# Patient Record
Sex: Female | Born: 1955 | Race: White | Hispanic: No | Marital: Married | Smoking: Former smoker
Health system: Southern US, Community
[De-identification: ages and names within clinical notes are randomized; demographics above are authoritative.]

## PROBLEM LIST (undated history)

## (undated) DIAGNOSIS — R112 Nausea with vomiting, unspecified: Secondary | ICD-10-CM

## (undated) DIAGNOSIS — E139 Other specified diabetes mellitus without complications: Secondary | ICD-10-CM

## (undated) DIAGNOSIS — S065X9A Traumatic subdural hemorrhage with loss of consciousness of unspecified duration, initial encounter: Secondary | ICD-10-CM

## (undated) DIAGNOSIS — Q613 Polycystic kidney, unspecified: Secondary | ICD-10-CM

## (undated) DIAGNOSIS — D649 Anemia, unspecified: Secondary | ICD-10-CM

## (undated) DIAGNOSIS — K7689 Other specified diseases of liver: Secondary | ICD-10-CM

## (undated) DIAGNOSIS — R7303 Prediabetes: Secondary | ICD-10-CM

## (undated) DIAGNOSIS — Z87442 Personal history of urinary calculi: Secondary | ICD-10-CM

## (undated) DIAGNOSIS — Z940 Kidney transplant status: Secondary | ICD-10-CM

## (undated) DIAGNOSIS — N170 Acute kidney failure with tubular necrosis: Secondary | ICD-10-CM

## (undated) DIAGNOSIS — N200 Calculus of kidney: Secondary | ICD-10-CM

## (undated) DIAGNOSIS — M1990 Unspecified osteoarthritis, unspecified site: Secondary | ICD-10-CM

## (undated) DIAGNOSIS — Z9889 Other specified postprocedural states: Secondary | ICD-10-CM

## (undated) DIAGNOSIS — T8619 Other complication of kidney transplant: Secondary | ICD-10-CM

## (undated) DIAGNOSIS — K219 Gastro-esophageal reflux disease without esophagitis: Secondary | ICD-10-CM

## (undated) DIAGNOSIS — I499 Cardiac arrhythmia, unspecified: Secondary | ICD-10-CM

## (undated) DIAGNOSIS — I10 Essential (primary) hypertension: Secondary | ICD-10-CM

## (undated) DIAGNOSIS — I701 Atherosclerosis of renal artery: Secondary | ICD-10-CM

## (undated) DIAGNOSIS — J189 Pneumonia, unspecified organism: Secondary | ICD-10-CM

## (undated) DIAGNOSIS — S065XAA Traumatic subdural hemorrhage with loss of consciousness status unknown, initial encounter: Secondary | ICD-10-CM

## (undated) DIAGNOSIS — M109 Gout, unspecified: Secondary | ICD-10-CM

## (undated) DIAGNOSIS — C801 Malignant (primary) neoplasm, unspecified: Secondary | ICD-10-CM

## (undated) DIAGNOSIS — Z5189 Encounter for other specified aftercare: Secondary | ICD-10-CM

## (undated) DIAGNOSIS — N281 Cyst of kidney, acquired: Secondary | ICD-10-CM

## (undated) DIAGNOSIS — Q6100 Congenital renal cyst, unspecified: Secondary | ICD-10-CM

## (undated) DIAGNOSIS — I4891 Unspecified atrial fibrillation: Secondary | ICD-10-CM

## (undated) DIAGNOSIS — E785 Hyperlipidemia, unspecified: Secondary | ICD-10-CM

## (undated) HISTORY — DX: Hyperlipidemia, unspecified: E78.5

## (undated) HISTORY — DX: Atherosclerosis of renal artery: I70.1

## (undated) HISTORY — DX: Unspecified atrial fibrillation: I48.91

## (undated) HISTORY — PX: KIDNEY TRANSPLANT: SHX239

## (undated) HISTORY — DX: Gastro-esophageal reflux disease without esophagitis: K21.9

## (undated) HISTORY — DX: Gout, unspecified: M10.9

## (undated) HISTORY — PX: TONSILLECTOMY: SUR1361

## (undated) HISTORY — DX: Traumatic subdural hemorrhage with loss of consciousness of unspecified duration, initial encounter: S06.5X9A

## (undated) HISTORY — DX: Acute kidney failure with tubular necrosis: N17.0

## (undated) HISTORY — DX: Kidney transplant status: Z94.0

## (undated) HISTORY — DX: Other specified diseases of liver: K76.89

## (undated) HISTORY — DX: Other specified diabetes mellitus without complications: E13.9

## (undated) HISTORY — DX: Encounter for other specified aftercare: Z51.89

## (undated) HISTORY — PX: KIDNEY STONE SURGERY: SHX686

## (undated) HISTORY — DX: Other complication of kidney transplant: T86.19

## (undated) HISTORY — DX: Traumatic subdural hemorrhage with loss of consciousness status unknown, initial encounter: S06.5XAA

## (undated) HISTORY — DX: Cyst of kidney, acquired: N28.1

## (undated) HISTORY — DX: Congenital renal cyst, unspecified: Q61.00

## (undated) HISTORY — DX: Polycystic kidney, unspecified: Q61.3

## (undated) HISTORY — DX: Calculus of kidney: N20.0

---

## 1999-02-10 ENCOUNTER — Other Ambulatory Visit: Admission: RE | Admit: 1999-02-10 | Discharge: 1999-02-10 | Payer: Self-pay | Admitting: Obstetrics and Gynecology

## 2000-06-07 ENCOUNTER — Other Ambulatory Visit: Admission: RE | Admit: 2000-06-07 | Discharge: 2000-06-07 | Payer: Self-pay | Admitting: Obstetrics and Gynecology

## 2000-11-16 ENCOUNTER — Encounter: Payer: Self-pay | Admitting: Obstetrics and Gynecology

## 2000-11-16 ENCOUNTER — Ambulatory Visit (HOSPITAL_COMMUNITY): Admission: RE | Admit: 2000-11-16 | Discharge: 2000-11-16 | Payer: Self-pay | Admitting: Obstetrics and Gynecology

## 2000-11-26 ENCOUNTER — Inpatient Hospital Stay (HOSPITAL_COMMUNITY): Admission: EM | Admit: 2000-11-26 | Discharge: 2000-11-30 | Payer: Self-pay | Admitting: Neurosurgery

## 2000-11-26 ENCOUNTER — Encounter: Payer: Self-pay | Admitting: Emergency Medicine

## 2000-11-26 HISTORY — PX: OTHER SURGICAL HISTORY: SHX169

## 2000-11-28 ENCOUNTER — Encounter: Payer: Self-pay | Admitting: Neurosurgery

## 2000-12-19 ENCOUNTER — Encounter: Payer: Self-pay | Admitting: Neurosurgery

## 2000-12-19 ENCOUNTER — Encounter: Admission: RE | Admit: 2000-12-19 | Discharge: 2000-12-19 | Payer: Self-pay | Admitting: Neurosurgery

## 2001-12-01 ENCOUNTER — Encounter: Admission: RE | Admit: 2001-12-01 | Discharge: 2001-12-01 | Payer: Self-pay | Admitting: Neurosurgery

## 2001-12-01 ENCOUNTER — Encounter: Payer: Self-pay | Admitting: Neurosurgery

## 2002-01-08 ENCOUNTER — Encounter: Payer: Self-pay | Admitting: Emergency Medicine

## 2002-01-08 ENCOUNTER — Emergency Department (HOSPITAL_COMMUNITY): Admission: EM | Admit: 2002-01-08 | Discharge: 2002-01-08 | Payer: Self-pay | Admitting: Emergency Medicine

## 2002-03-08 ENCOUNTER — Other Ambulatory Visit: Admission: RE | Admit: 2002-03-08 | Discharge: 2002-03-08 | Payer: Self-pay | Admitting: Obstetrics and Gynecology

## 2003-03-12 ENCOUNTER — Ambulatory Visit (HOSPITAL_COMMUNITY): Admission: RE | Admit: 2003-03-12 | Discharge: 2003-03-12 | Payer: Self-pay | Admitting: Obstetrics and Gynecology

## 2003-03-12 ENCOUNTER — Encounter: Payer: Self-pay | Admitting: Obstetrics and Gynecology

## 2003-03-20 ENCOUNTER — Other Ambulatory Visit: Admission: RE | Admit: 2003-03-20 | Discharge: 2003-03-20 | Payer: Self-pay | Admitting: Obstetrics and Gynecology

## 2004-03-11 HISTORY — PX: COLONOSCOPY: SHX174

## 2004-03-24 ENCOUNTER — Other Ambulatory Visit: Admission: RE | Admit: 2004-03-24 | Discharge: 2004-03-24 | Payer: Self-pay | Admitting: Obstetrics and Gynecology

## 2004-05-01 ENCOUNTER — Ambulatory Visit (HOSPITAL_COMMUNITY): Admission: RE | Admit: 2004-05-01 | Discharge: 2004-05-01 | Payer: Self-pay | Admitting: Obstetrics and Gynecology

## 2005-04-27 ENCOUNTER — Other Ambulatory Visit: Admission: RE | Admit: 2005-04-27 | Discharge: 2005-04-27 | Payer: Self-pay | Admitting: Obstetrics and Gynecology

## 2005-05-13 ENCOUNTER — Ambulatory Visit (HOSPITAL_COMMUNITY): Admission: RE | Admit: 2005-05-13 | Discharge: 2005-05-13 | Payer: Self-pay | Admitting: Obstetrics and Gynecology

## 2005-07-06 ENCOUNTER — Encounter: Admission: RE | Admit: 2005-07-06 | Discharge: 2005-07-06 | Payer: Self-pay | Admitting: Family Medicine

## 2006-05-17 ENCOUNTER — Other Ambulatory Visit: Admission: RE | Admit: 2006-05-17 | Discharge: 2006-05-17 | Payer: Self-pay | Admitting: Obstetrics & Gynecology

## 2006-06-06 ENCOUNTER — Ambulatory Visit: Payer: Self-pay | Admitting: Family Medicine

## 2006-06-09 ENCOUNTER — Encounter: Admission: RE | Admit: 2006-06-09 | Discharge: 2006-06-09 | Payer: Self-pay | Admitting: Family Medicine

## 2006-07-19 DIAGNOSIS — I10 Essential (primary) hypertension: Secondary | ICD-10-CM | POA: Insufficient documentation

## 2006-07-19 DIAGNOSIS — M1990 Unspecified osteoarthritis, unspecified site: Secondary | ICD-10-CM | POA: Insufficient documentation

## 2006-07-19 DIAGNOSIS — N200 Calculus of kidney: Secondary | ICD-10-CM | POA: Insufficient documentation

## 2006-07-19 DIAGNOSIS — E785 Hyperlipidemia, unspecified: Secondary | ICD-10-CM | POA: Insufficient documentation

## 2006-07-19 DIAGNOSIS — E669 Obesity, unspecified: Secondary | ICD-10-CM

## 2006-09-12 ENCOUNTER — Ambulatory Visit: Payer: Self-pay | Admitting: Family Medicine

## 2006-10-12 ENCOUNTER — Telehealth: Payer: Self-pay | Admitting: Family Medicine

## 2006-10-18 ENCOUNTER — Ambulatory Visit: Payer: Self-pay | Admitting: Family Medicine

## 2007-01-20 ENCOUNTER — Encounter: Payer: Self-pay | Admitting: Family Medicine

## 2007-01-20 LAB — CONVERTED CEMR LAB
Cholesterol: 229 mg/dL — ABNORMAL HIGH (ref 0–200)
Triglycerides: 248 mg/dL — ABNORMAL HIGH (ref <150)

## 2007-01-23 ENCOUNTER — Encounter: Payer: Self-pay | Admitting: Family Medicine

## 2007-02-13 ENCOUNTER — Ambulatory Visit: Payer: Self-pay | Admitting: Family Medicine

## 2007-02-24 ENCOUNTER — Encounter: Payer: Self-pay | Admitting: Family Medicine

## 2007-02-24 LAB — CONVERTED CEMR LAB
ALT: 10 units/L (ref 0–35)
Albumin: 4.1 g/dL (ref 3.5–5.2)
Alkaline Phosphatase: 64 units/L (ref 39–117)
BUN: 17 mg/dL (ref 6–23)
Glucose, Bld: 81 mg/dL (ref 70–99)
Sodium: 137 meq/L (ref 135–145)

## 2007-02-27 ENCOUNTER — Telehealth (INDEPENDENT_AMBULATORY_CARE_PROVIDER_SITE_OTHER): Payer: Self-pay | Admitting: *Deleted

## 2007-05-29 ENCOUNTER — Other Ambulatory Visit: Admission: RE | Admit: 2007-05-29 | Discharge: 2007-05-29 | Payer: Self-pay | Admitting: Obstetrics and Gynecology

## 2007-06-26 ENCOUNTER — Encounter: Admission: RE | Admit: 2007-06-26 | Discharge: 2007-06-26 | Payer: Self-pay | Admitting: Obstetrics & Gynecology

## 2007-09-19 ENCOUNTER — Ambulatory Visit: Payer: Self-pay | Admitting: Family Medicine

## 2007-09-21 ENCOUNTER — Ambulatory Visit: Payer: Self-pay | Admitting: Family Medicine

## 2007-12-18 ENCOUNTER — Telehealth (INDEPENDENT_AMBULATORY_CARE_PROVIDER_SITE_OTHER): Payer: Self-pay | Admitting: *Deleted

## 2008-04-05 ENCOUNTER — Encounter: Payer: Self-pay | Admitting: Family Medicine

## 2008-04-05 LAB — CONVERTED CEMR LAB
Alkaline Phosphatase: 74 units/L
CO2: 20 meq/L
Calcium: 8.9 mg/dL
Chloride: 107 meq/L
Cholesterol: 231 mg/dL
Creatinine, Ser: 0.73 mg/dL
HDL: 43 mg/dL
Potassium: 4 meq/L
Total Bilirubin: 0.5 mg/dL

## 2008-04-30 ENCOUNTER — Encounter: Payer: Self-pay | Admitting: Family Medicine

## 2008-05-14 IMAGING — MG MM SCREEN MAMMOGRAM BILATERAL
5 series · 5 of 5 positions shown · non-contrast
Comparison: none

DG SCREEN MAMMOGRAM BILATERAL
Bilateral CC and MLO view(s) were taken.
Prior study comparison: May 01, 2004, bilateral screening mammogram, performed at [facility]

SCREENING MAMMOGRAM:
There are scattered fibroglandular densities.  There is no dominant mass, architectural distortion 
or calcification to suggest malignancy.

[R CC]
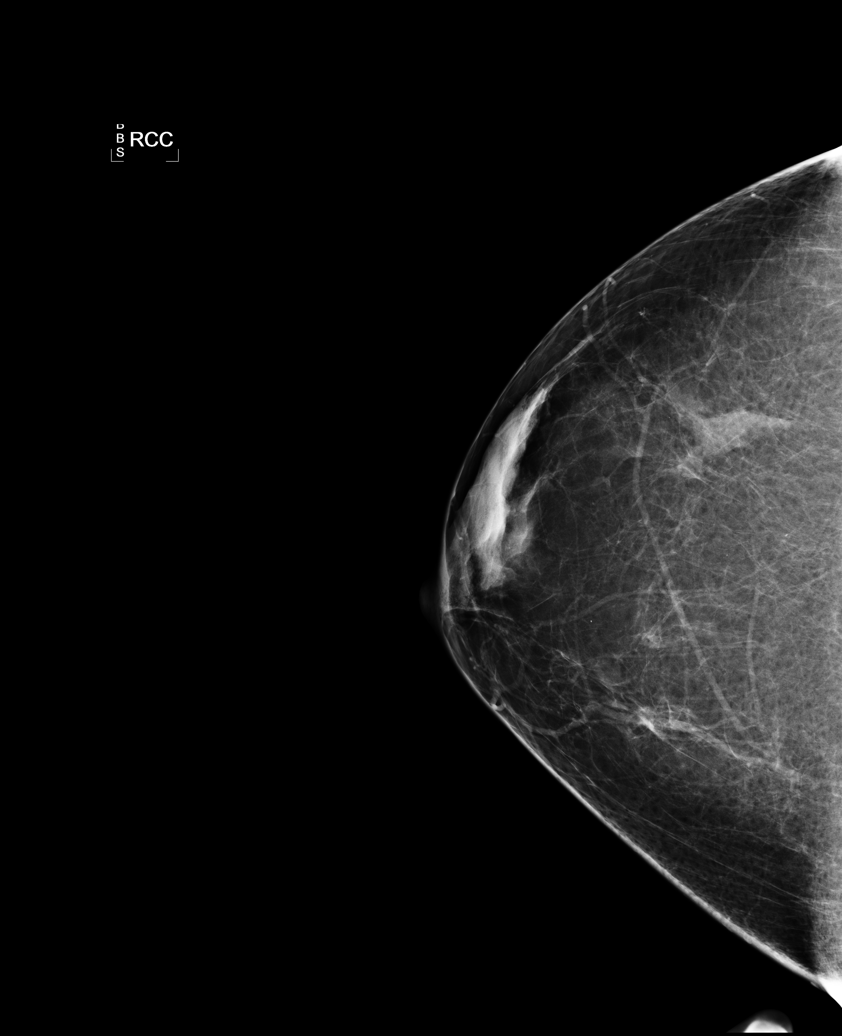

[L CC]
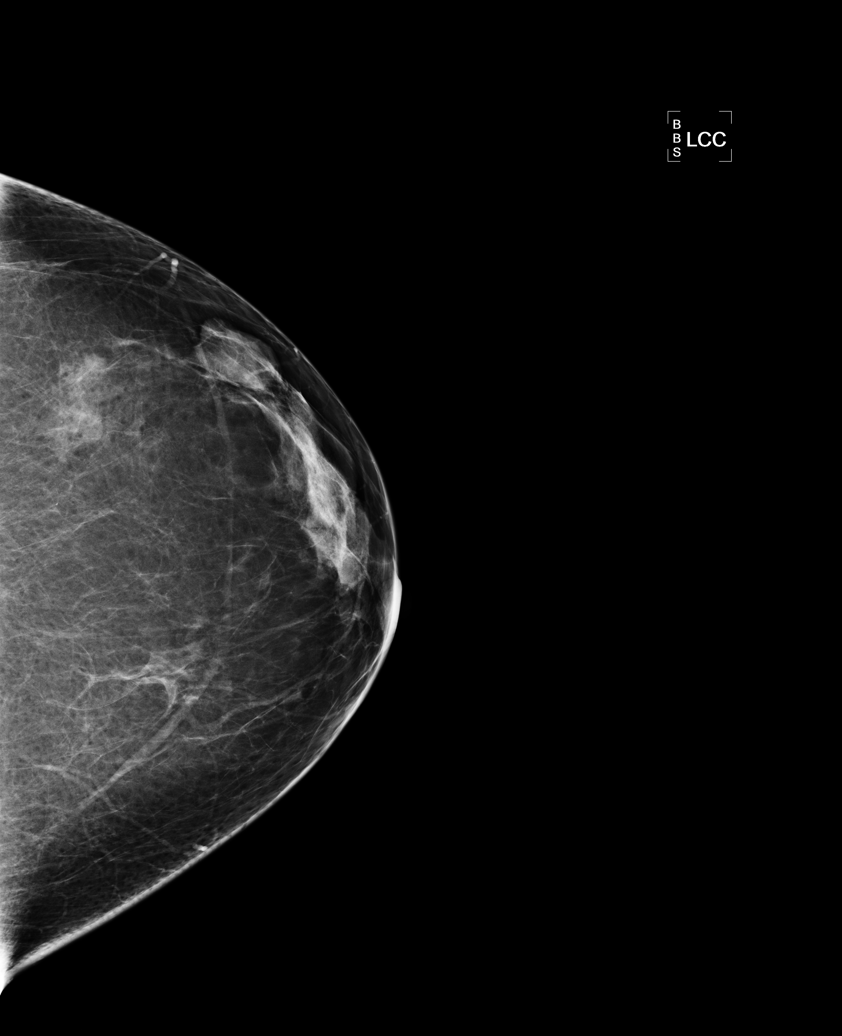

[L MLO (1 of 2)]
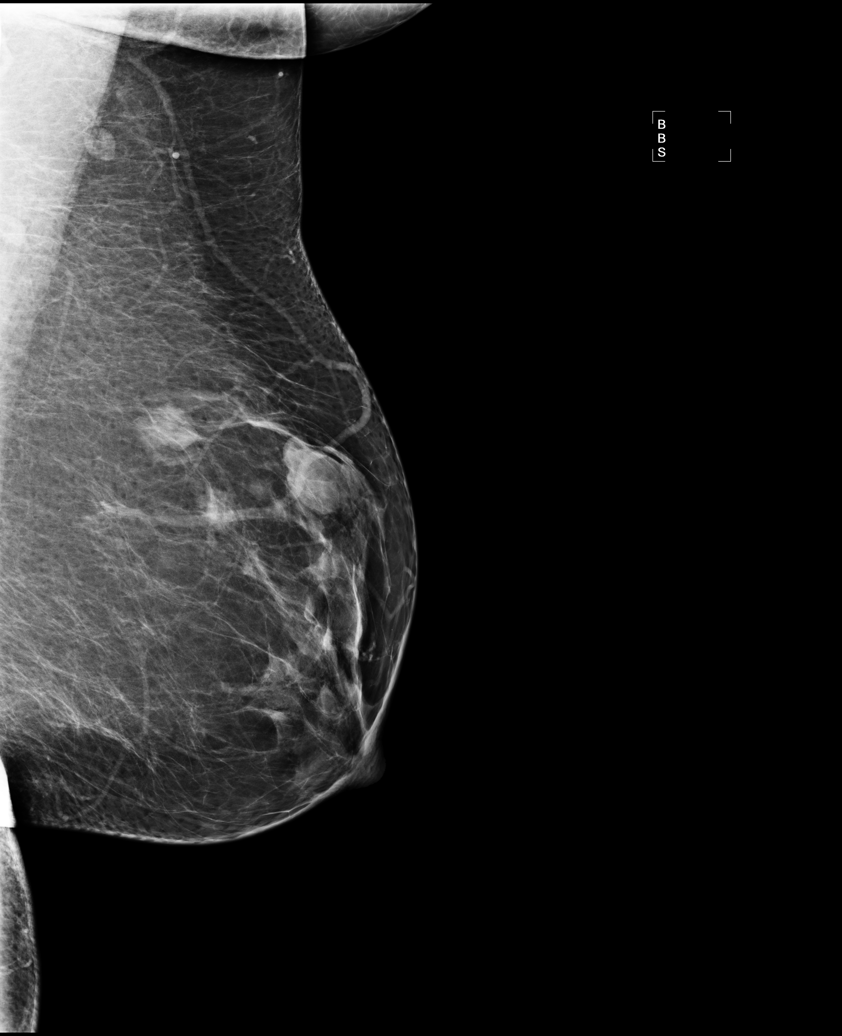

[R MLO]
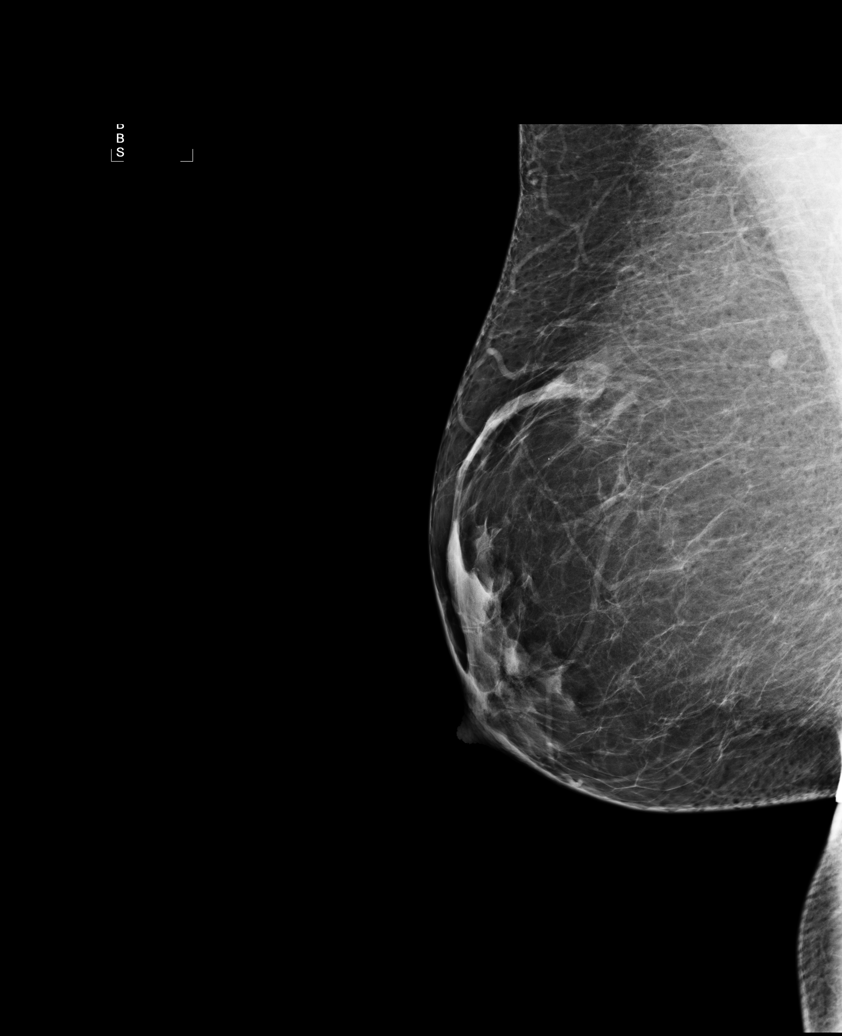

[L MLO (2 of 2)]
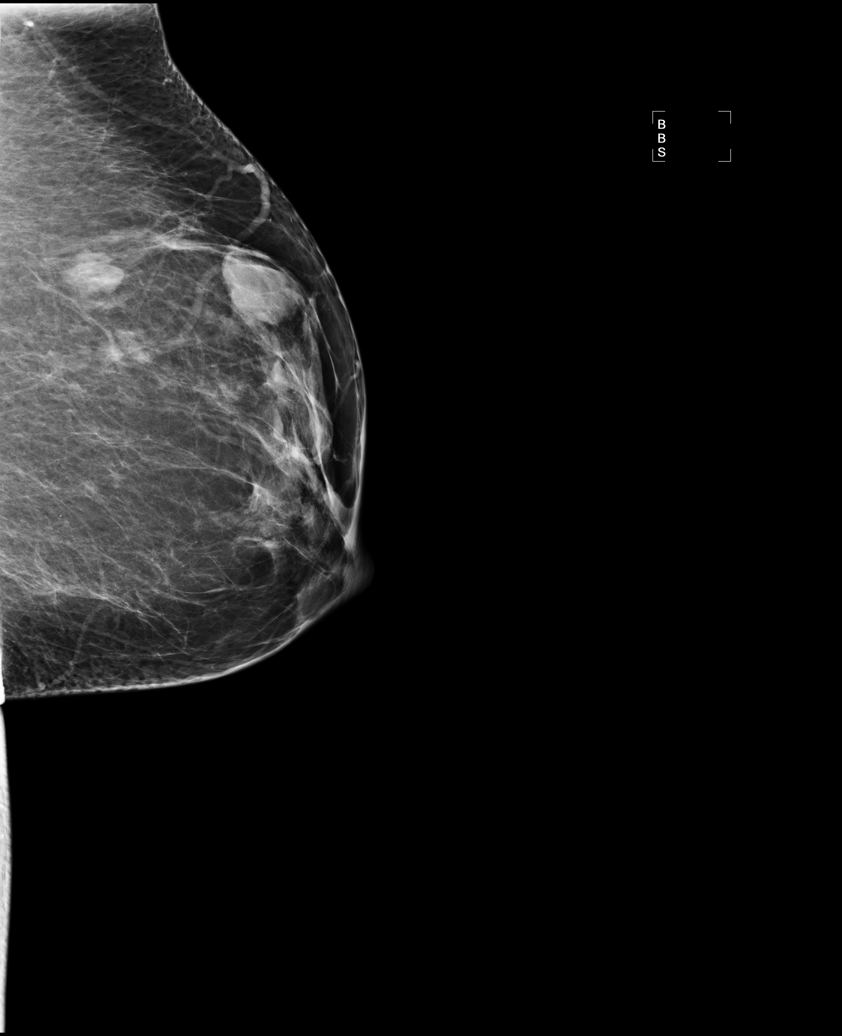

[5 of 5 positions shown; findings below may reference images not displayed]

IMPRESSION: No mammographic evidence of malignancy.  Suggest yearly screening mammography.

ASSESSMENT: Negative - BI-RADS 1

Screening mammogram in 1 year.
ANALYZED BY COMPUTER AIDED DETECTION. , THIS PROCEDURE WAS A DIGITAL MAMMOGRAM.

## 2008-07-12 ENCOUNTER — Other Ambulatory Visit: Admission: RE | Admit: 2008-07-12 | Discharge: 2008-07-12 | Payer: Self-pay | Admitting: Obstetrics and Gynecology

## 2008-07-12 LAB — CONVERTED CEMR LAB
Cholesterol: 229 mg/dL
HDL: 46 mg/dL
LDL Cholesterol: 144 mg/dL
Triglycerides: 196 mg/dL

## 2008-07-22 ENCOUNTER — Encounter: Admission: RE | Admit: 2008-07-22 | Discharge: 2008-07-22 | Payer: Self-pay | Admitting: Obstetrics & Gynecology

## 2008-10-08 ENCOUNTER — Ambulatory Visit: Payer: Self-pay | Admitting: Family Medicine

## 2008-10-08 DIAGNOSIS — Q613 Polycystic kidney, unspecified: Secondary | ICD-10-CM

## 2008-12-23 ENCOUNTER — Ambulatory Visit: Payer: Self-pay | Admitting: Family Medicine

## 2008-12-23 DIAGNOSIS — N390 Urinary tract infection, site not specified: Secondary | ICD-10-CM

## 2008-12-23 LAB — CONVERTED CEMR LAB
Bilirubin Urine: NEGATIVE
Glucose, Urine, Semiquant: NEGATIVE
Nitrite: NEGATIVE
Urobilinogen, UA: 0.2
pH: 6.5

## 2009-07-16 ENCOUNTER — Encounter: Payer: Self-pay | Admitting: Family Medicine

## 2009-07-25 ENCOUNTER — Encounter: Payer: Self-pay | Admitting: Family Medicine

## 2009-07-28 ENCOUNTER — Encounter: Admission: RE | Admit: 2009-07-28 | Discharge: 2009-07-28 | Payer: Self-pay | Admitting: Obstetrics & Gynecology

## 2009-09-24 ENCOUNTER — Ambulatory Visit: Payer: Self-pay | Admitting: Family Medicine

## 2009-09-24 LAB — CONVERTED CEMR LAB
HDL goal, serum: 40 mg/dL
LDL Goal: 160 mg/dL

## 2009-10-02 ENCOUNTER — Ambulatory Visit: Payer: Self-pay | Admitting: Family Medicine

## 2009-10-02 LAB — CONVERTED CEMR LAB
Glucose, Urine, Semiquant: NEGATIVE
Ketones, urine, test strip: NEGATIVE
Specific Gravity, Urine: 1.015
Urobilinogen, UA: 0.2
pH: 6

## 2009-10-03 ENCOUNTER — Encounter: Payer: Self-pay | Admitting: Family Medicine

## 2009-10-13 ENCOUNTER — Telehealth: Payer: Self-pay | Admitting: Family Medicine

## 2009-12-05 ENCOUNTER — Telehealth: Payer: Self-pay | Admitting: Family Medicine

## 2010-01-22 ENCOUNTER — Ambulatory Visit: Payer: Self-pay | Admitting: Family Medicine

## 2010-04-21 ENCOUNTER — Encounter: Payer: Self-pay | Admitting: Family Medicine

## 2010-06-01 ENCOUNTER — Encounter: Payer: Self-pay | Admitting: Family Medicine

## 2010-09-23 ENCOUNTER — Ambulatory Visit: Payer: Self-pay | Admitting: Family Medicine

## 2010-09-23 ENCOUNTER — Telehealth: Payer: Self-pay | Admitting: Family Medicine

## 2010-09-25 ENCOUNTER — Encounter: Payer: Self-pay | Admitting: Family Medicine

## 2010-09-30 ENCOUNTER — Telehealth: Payer: Self-pay | Admitting: Family Medicine

## 2010-10-06 ENCOUNTER — Encounter: Payer: Self-pay | Admitting: Family Medicine

## 2010-10-09 ENCOUNTER — Ambulatory Visit: Payer: Self-pay | Admitting: Family Medicine

## 2010-10-09 ENCOUNTER — Encounter: Payer: Self-pay | Admitting: Family Medicine

## 2010-10-09 DIAGNOSIS — M109 Gout, unspecified: Secondary | ICD-10-CM | POA: Insufficient documentation

## 2010-10-09 DIAGNOSIS — A09 Infectious gastroenteritis and colitis, unspecified: Secondary | ICD-10-CM | POA: Insufficient documentation

## 2010-10-09 DIAGNOSIS — E559 Vitamin D deficiency, unspecified: Secondary | ICD-10-CM | POA: Insufficient documentation

## 2010-10-11 DIAGNOSIS — I4891 Unspecified atrial fibrillation: Secondary | ICD-10-CM

## 2010-10-11 HISTORY — DX: Unspecified atrial fibrillation: I48.91

## 2010-10-13 ENCOUNTER — Encounter: Payer: Self-pay | Admitting: Family Medicine

## 2010-10-13 DIAGNOSIS — R748 Abnormal levels of other serum enzymes: Secondary | ICD-10-CM | POA: Insufficient documentation

## 2010-10-13 LAB — CONVERTED CEMR LAB
ALT: 21 units/L (ref 0–35)
Albumin: 4.2 g/dL (ref 3.5–5.2)
BUN: 24 mg/dL — ABNORMAL HIGH (ref 6–23)
CO2: 21 meq/L (ref 19–32)
Calcium: 9.4 mg/dL (ref 8.4–10.5)
Creatinine, Ser: 0.95 mg/dL (ref 0.40–1.20)
Eosinophils Absolute: 0.2 10*3/uL (ref 0.0–0.7)
Hemoglobin: 11.7 g/dL — ABNORMAL LOW (ref 12.0–15.0)
Lymphocytes Relative: 26 % (ref 12–46)
Lymphs Abs: 2.2 10*3/uL (ref 0.7–4.0)
MCHC: 32 g/dL (ref 30.0–36.0)
MCV: 82.6 fL (ref 78.0–100.0)
Neutro Abs: 5.8 10*3/uL (ref 1.7–7.7)
Platelets: 480 10*3/uL — ABNORMAL HIGH (ref 150–400)
Sodium: 140 meq/L (ref 135–145)
WBC: 8.8 10*3/uL (ref 4.0–10.5)

## 2010-10-15 ENCOUNTER — Encounter: Payer: Self-pay | Admitting: Family Medicine

## 2010-10-15 LAB — CONVERTED CEMR LAB
Cholesterol: 190 mg/dL
HDL: 50 mg/dL
Triglycerides: 180 mg/dL

## 2010-10-16 ENCOUNTER — Ambulatory Visit
Admission: RE | Admit: 2010-10-16 | Discharge: 2010-10-16 | Payer: Self-pay | Attending: Family Medicine | Admitting: Family Medicine

## 2010-10-16 LAB — CONVERTED CEMR LAB: OCCULT 3: NEGATIVE

## 2010-10-20 ENCOUNTER — Encounter: Payer: Self-pay | Admitting: Family Medicine

## 2010-10-22 ENCOUNTER — Encounter: Payer: Self-pay | Admitting: Family Medicine

## 2010-11-10 NOTE — Letter (Signed)
Summary: [facility]  [facility]   Imported By: Lanelle Bal 04/28/2010 14:38:28  _____________________________________________________________________  External Attachment:    Type:   Image     Comment:   External Document

## 2010-11-10 NOTE — Letter (Signed)
Summary: [facility]  [facility]   Imported By: Lanelle Bal 06/08/2010 10:16:05  _____________________________________________________________________  External Attachment:    Type:   Image     Comment:   External Document

## 2010-11-10 NOTE — Progress Notes (Signed)
Summary: Enalapril causing cough  Phone Note Call from Patient   Caller: Patient Summary of Call: Pt states the enalapril is causing a cough and she would like it changed to something else. Please advise.  Initial call taken by: Payton Spark CMA,  October 13, 2009 9:33 AM    New/Updated Medications: DIOVAN 80 MG TABS (VALSARTAN) 1 tab by mouth daily Prescriptions: DIOVAN 80 MG TABS (VALSARTAN) 1 tab by mouth daily  #30 x 1   Entered and Authorized by:   Seymour Bars DO   Signed by:   Seymour Bars DO on 10/13/2009   Method used:   Electronically to        [facility] [phone]* (retail)       [address]       [place], [place]  [address]       Ph: [phone]       Fax: [phone]   RxID:   [id number]   Appended Document: Enalapril causing cough Pt aware

## 2010-11-10 NOTE — Progress Notes (Signed)
Summary: diuretic as needed  Phone Note Call from Patient   Caller: Patient Summary of Call: Pt states at last OV you discussed giving low dose diuretic. Pt would like something she can take when needed. Please advise.  Initial call taken by: Payton Spark CMA,  December 05, 2009 1:43 PM    New/Updated Medications: FUROSEMIDE 20 MG TABS (FUROSEMIDE) 1 tab by mouth once daily as needed for swelling Prescriptions: FUROSEMIDE 20 MG TABS (FUROSEMIDE) 1 tab by mouth once daily as needed for swelling  #30 x 0   Entered and Authorized by:   Seymour Bars DO   Signed by:   Seymour Bars DO on 12/05/2009   Method used:   Electronically to        [facility] [phone]* (retail)       [address]       [place], [place]  [address]       Ph: [phone]       Fax: [phone]   RxID:   [id number]   Appended Document: diuretic as needed Pt aware

## 2010-11-10 NOTE — Assessment & Plan Note (Signed)
Summary: tick bite   Vital Signs:  Patient profile:   55 year old female Height:      63.5 inches Weight:      194 pounds BMI:     33.95 O2 Sat:      95 % on Room air Temp:     98.5 degrees F oral Pulse rate:   96 / minute BP sitting:   141 / 83  (left arm) Cuff size:   large  Vitals Entered By: Payton Spark CMA (January 22, 2010 3:10 PM)  O2 Flow:  Room air CC: Tick bite 01/13/10. L hip/groin area is very red, swollen and warm.    Primary Care Provider:  Seymour Bars D.O.  CC:  Tick bite 01/13/10. L hip/groin area is very red and swollen and warm. Mariah [name]  History of Present Illness: 55 yo WF presents for a tick bite that occured April 5th while camping over the lateral crease of her L thigh.    She feels like the redness and itching has worsened.  She has some tenderness.  She has not had any diffuse rash.  Denies any change from her usual joint pain.  She has not had any fevers.  She thinks the tick was atttached < 6 hrs.  she had a 2nd tick bite over her buttock.      Current Medications (verified): 1)  Effexor Xr 75 Mg Xr24h-Cap (Venlafaxine Hcl) .... Take 1 Cap By Mouth Once Daily 2)  Vitamin D [address] 3)  Flexeril 10 Mg Tabs (Cyclobenzaprine Hcl) .... Take 1/2 Tab By Mouth As Needed 4)  [name] 10-80 Mg Tabs (Amlodipine-Atorvastatin) .Mariah [name].. 1 Tab By Mouth Qhs 5)  Diovan 80 Mg Tabs (Valsartan) .Mariah [name].. 1 Tab By Mouth Daily 6)  Furosemide 20 Mg Tabs (Furosemide) .Mariah [name].. 1 Tab By Mouth Once Daily As Needed For Swelling  Allergies (verified): 1)  ! * Codiene 2)  ! Morphine  Past History:  Past Medical History: Reviewed history from 10/08/2008 and no changes required. 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 drainage of SDH  Kidney stone removal  Family History: Reviewed history from 09/12/2006 and no changes required. 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  Quit smoking after 40 pk year history in 1998.  Trying to lose wt.  Physical activity at work, but no exercise.  Has about 1 drink per day.  Sexaully active, lesbian relationship.  Review of Systems  The patient denies fever.    Physical Exam  General:  alert, well-developed, well-nourished, and well-hydrated.  obese Head:  normocephalic and atraumatic.   Mouth:  pharynx pink and moist.   no lesions on the oropharynx Lungs:  Normal respiratory effort, chest expands symmetrically. Lungs are clear to auscultation, no crackles or wheezes. Heart:  Normal rate and regular rhythm. S1 and S2 normal without gallop, murmur, click, rub or other extra sounds. Skin:  2 x 7 cm red lichenified skin over tick bite mark over the lateral L proximal thigh.  No purulence, bleeding or scaling.  Slightly tender and warm.   Impression & Recommendations:  Problem # 1:  TICK BITE (ICD-E906.4) Spreading erythema around tick bite with warm and tenderness.  Treat for localized cellulitis with Doxycycline  x 1 wk.  Clean with soap and water.  Treat itching with a topical steroid cream.  No sign of systemic infeciton.    Complete Medication List: 1)  Effexor Xr 75 Mg Xr24h-cap (Venlafaxine hcl) .... Take 1 cap by mouth once daily 2)  Vitamin D [address]  3)  Flexeril 10 Mg Tabs (Cyclobenzaprine hcl) .... Take 1/2 tab by mouth as needed 4)  [name] 10-80 Mg Tabs (Amlodipine-atorvastatin) .Mariah [name].. 1 tab by mouth qhs 5)  Diovan 80 Mg Tabs (Valsartan) .Mariah [name].. 1 tab by mouth daily 6)  Furosemide 20 Mg Tabs (Furosemide) .Mariah [name].. 1 tab by mouth once daily as needed for swelling 7)  Doxycycline Hyclate 100 Mg Tabs (Doxycycline hyclate) .Mariah [name].. 1 tab by mouth two times a day x 7 days 8)  Triamcinolone Acetonide 0.5 % Crea (Triamcinolone acetonide) .... Apply  to rash two times a day x 7 days  Patient Instructions: 1)  Take 7 days of Doxycycline for reaction to tick bite. 2)  Use topical Triamcinolone cream for itching and redness. 3)  Clean with soap and water. Prescriptions: TRIAMCINOLONE ACETONIDE 0.5 % CREA (TRIAMCINOLONE ACETONIDE) apply to rash two times a day x 7 days  #1 tube x 0   Entered and Authorized by:   Seymour Bars DO   Signed by:   Seymour Bars DO on 01/22/2010   Method used:   Electronically to        [facility] [phone]* (retail)       [address]       [place], [place]  [address]       Ph: [phone]       Fax: [phone]   RxID:   [id number] DOXYCYCLINE HYCLATE 100 MG TABS (DOXYCYCLINE HYCLATE) 1 tab by mouth two times a day x 7 days  #14 x 0   Entered and Authorized by:   Seymour Bars DO   Signed by:   Seymour Bars DO on 01/22/2010   Method used:   Electronically to        [facility] [phone]* (retail)       [address]       [place], [place]  [address]       Ph: [phone]       Fax: [phone]   RxID:   [id number]

## 2010-11-12 NOTE — Progress Notes (Signed)
Summary: yeast  Phone Note Call from Patient Call back at Home Phone [phone]   Caller: Patient Call For: Seymour Bars DO Summary of Call: Pt calls and wanted to know if you would call in a Diflucan for her- has been on antibiotics and has a yeast infectiion. Call pt to let if this can be done Initial call taken by: Kathlene November LPN,  September 30, 2010 11:54 AM  Follow-up for Phone Call        diflucan has a major drug interaction with her caduet.  I would recommend Use of OTC yeast treatment like miconazole. Follow-up by: Seymour Bars DO,  September 30, 2010 11:56 AM     Appended Document: yeast 09/30/2010- Pt notified of above information and instructions. KJ LPN

## 2010-11-12 NOTE — Assessment & Plan Note (Signed)
Summary: HFU infectious colitis   Vital Signs:  Patient profile:   55 year old female Height:      63.5 inches Weight:      194 pounds BMI:     33.95 O2 Sat:      95 % on Room air Temp:     98.4 degrees F oral Pulse rate:   82 / minute BP sitting:   126 / 65  (left arm) Cuff size:   large  Vitals Entered By: Payton Spark CMA (October 09, 2010 2:11 PM)  O2 Flow:  Room air CC: Hosp f/u. Med refills and discuss gout dx and meds Rxd.    Primary Care Willson Lipa:  Seymour Bars D.O.  CC:  Hosp f/u. Med refills and discuss gout dx and meds Rxd. .  History of Present Illness: Mariah yo [name] presents for HFU visit.  She was put on Cipro and Flagyl IV followed by oral antibiotics 1 wk ago for infectious colitisi.    Her bowels are back to normal.  Denies blood in the stool.  She is due for CMP and CBC now.  Had elevated LFTs and leukocytosis during hospitalization.  Her renal function was stable with PCKD.  Her podiatrist told her that she had gout this wk after having pain, redness and swelling in the L foot which has much improved.  She had it injected.  Her uric acid was 7.1 and her ESR was high at 64.  Denies fevers, chills.  Appetite and energy level are improving.   Current Medications (verified): 1)  Effexor Xr 75 Mg Xr24h-Cap (Venlafaxine Hcl) .... Take 1 Cap By Mouth Once Daily 2)  Vitamin D [address]109 3)  Flexeril 10 Mg Tabs (Cyclobenzaprine Hcl) .... Take 1/2 Tab By Mouth As Needed 4)  [name] 10-80 Mg Tabs (Amlodipine-Atorvastatin) .[name] Kitchen.. 1 Tab By Mouth Qhs 5)  Diovan 80 Mg Tabs (Valsartan) .[name] Kitchen.. 1 Tab By Mouth Daily Mariah)  Metoprolol Tartrate 25 Mg Tabs (Metoprolol Tartrate) .... Take 1 Tab By Mouth Once Daily  Allergies (verified): 1)  ! * Codiene 2)  ! Morphine  Past History:  Past Medical History: Reviewed history from 10/08/2008 and no changes required. Adult PCKD with liver involvement  subdural hematoma after a fall 2002 perimenopausal HTN  Family History: Reviewed history  from 09/12/2006 and no changes required. 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  Trying to lose wt.  Physical activity at work, but no exercise.  Has about 1 drink per day.  Sexaully active, lesbian relationship.  Review of Systems      See HPI  Physical Exam  General:  alert, well-developed, well-nourished, well-hydrated, and overweight-appearing.   Head:  normocephalic and atraumatic.   Eyes:  sclera non icteric Mouth:  pharynx pink and moist.   Neck:  no masses.   Lungs:  Normal respiratory effort, chest expands symmetrically. Lungs are clear to auscultation, no crackles or wheezes. Heart:  Normal rate and regular rhythm. S1 and S2 normal without gallop, murmur, click, rub or other extra sounds. Abdomen:  soft, non-tender, normal bowel sounds, no distention, no masses, no guarding, no hepatomegaly, and no splenomegaly.   Msk:  no synovitis Pulses:  2+ pedal pulses Extremities:  no LE edema L foot slightly warm but no  redness.  No palpable tenderness Skin:  color normal.     Impression & Recommendations:  Problem # 1:  INFECTIOUS COLITIS ENTERITIS AND GASTROENTERITIS (ICD-009.0) Reviewed hospital notes.  She is feeling much better.  She had colon thickening on her CT scan and had a colonoscopy 5 yrs ago.  No longer having abd pain, blood in the stool, N/V/D.  She is to repeat her labs today and will f/u results next wk.  Can start a probiotic daily. Orders: T-Comprehensive Metabolic Panel ([id number]) T-CBC w/Diff ([id number])  Problem # 2:  GOUT, UNSPECIFIED (ICD-274.9) Assessment: New Reviewed labs from Dr Herma Carson. She seems to have responded well to the L foot injection b/c her pain and swelling are much better.  Will repeat her ESR with labs.  She opts to not  take the Colchicine or Augmentin since she just got over infectious diarrhea with dehydration.  Clinically, she does not have a septic joint but will be checking her CBC today anyway.  Info given from [email or web address] on gout and diet. Orders: T-Sed Rate (Automated) [id number]) T-Vitamin D (25-Hydroxy) [phone])  Complete Medication List: 1)  Effexor Xr 75 Mg Xr24h-cap (Venlafaxine hcl) .... Take 1 cap by mouth once daily 2)  Vitamin D 28413  3)  Flexeril 10 Mg Tabs (Cyclobenzaprine hcl) .... Take 1/2 tab by mouth as needed 4)  [name] 10-80 Mg Tabs (Amlodipine-atorvastatin) .[name] Kitchen.. 1 tab by mouth qhs 5)  Diovan 80 Mg Tabs (Valsartan) .[name] Kitchen.. 1 tab by mouth daily Mariah)  Metoprolol Tartrate 25 Mg Tabs (Metoprolol tartrate) .... Take 1 tab by mouth once daily  Patient Instructions: 1)  Labs today. 2)  Will call you w/ results on Tuesday. 3)  REad thru diet info for gout. 4)  Ice L foot/ ankle, elevated it and use Advil 3 tabs up to 3 x a day with food for the next 2-3 days as needed for pain and swelling. 5)  Diovan RFd. Mariah)  REturn for f/u in 2 mos. Prescriptions: DIOVAN 80 MG TABS (VALSARTAN) 1 tab by mouth daily  #30 x 3   Entered and Authorized by:   Seymour Bars DO   Signed by:   Seymour Bars DO on 10/09/2010   Method used:   Electronically to        [facility] [phone]* (retail)       [address]       [place], [place]  [address]       Ph: [phone]       Fax: [phone]   RxID:   [id number]    Orders Added: 1)  T-Sed Rate (Automated) [[id number]] 2)  T-Comprehensive Metabolic Panel [[id number]] 3)  T-CBC w/Diff [[id number]] 4)  T-Vitamin D (25-Hydroxy) [[id number]] 5)  Est. Patient Level IV [83151]

## 2010-11-12 NOTE — Miscellaneous (Signed)
Summary: FLP  Clinical Lists Changes  Observations: Added new observation of LDL: 104 mg/dL (16/07/9603 54:09) Added new observation of HDL: 50 mg/dL (81/19/1478 29:56) Added new observation of TRIGLYC TOT: 180 mg/dL (21/30/8657 84:69) Added new observation of CHOLESTEROL: 190 mg/dL (62/95/2841 32:44)  Appended Document: FLP Pls let pt know that her total cholesterol and LDL bad cholesterol look good on [name]-- continue.  Her TGs are slightly high (fats int eh bloodstream).  Work on [employer], regular exercise and wt loss for this.  Repeat in 1 yr.  Seymour Bars, D.O.  Appended Document: FLP 10/20/2010 @ 12:55pm- Pt notified of results and instructions. KJ LPN

## 2010-11-12 NOTE — Letter (Signed)
Summary: [facility]  [facility]   Imported By: Lanelle Bal 10/09/2010 12:02:35  _____________________________________________________________________  External Attachment:    Type:   Image     Comment:   External Document

## 2010-11-12 NOTE — Progress Notes (Signed)
Summary: GI illness  Phone Note Call from Patient   Caller: Patient Summary of Call: Pt's partner states Pt has had nausea, diarrhea and fever x 3 days. Dr. Cathey Endow will call to get more details. Initial call taken by: Payton Spark CMA,  September 23, 2010 8:28 AM  Follow-up for Phone Call        I talked to her partner Gilford Raid on the phone this morning.  She has continued to have fever to 103, with 48 hrs of N/D, no vomitting.  On Phenergan and Immodium but having 15+ watery stools and carmping/ day.  Trying to drink but feels dizzy this morning.  No blood ins tool.  No sick contacts.  Hx PCKD.  I advised Rinaldo Cloud to take Derotha directly to [facility] ED for IV fluid rehydration and labs -- she is at risk for renal insufficiency.  She agrees and will keep me posted. Follow-up by: Seymour Bars DO,  September 23, 2010 8:34 AM

## 2010-11-12 NOTE — Assessment & Plan Note (Signed)
Summary: stool cards//mpm  Nurse Visit   Vitals Entered By: Payton Spark CMA (October 16, 2010 11:45 AM)  Allergies: 1)  ! * Codiene 2)  ! Morphine Laboratory Results    Stool - Occult Blood Hemmoccult #1: negative Hemoccult #2: negative Hemoccult #3: negative   Orders Added: 1)  Hemoccult Cards -3 specimans (take home) [[id number]]  Appended Document: stool cards//mpm Pls let pt know that all 3 hemoccult cards came back negative for blood.  Seymour Bars, D.O.  Appended Document: stool cards//mpm LMOM informing Pt of the above

## 2010-11-18 NOTE — Consult Note (Signed)
Summary: [facility]  [facility]   Imported By: Lanelle Bal 11/13/2010 09:06:25  _____________________________________________________________________  External Attachment:    Type:   Image     Comment:   External Document

## 2010-12-22 ENCOUNTER — Encounter: Payer: Self-pay | Admitting: Family Medicine

## 2010-12-22 ENCOUNTER — Ambulatory Visit (INDEPENDENT_AMBULATORY_CARE_PROVIDER_SITE_OTHER): Payer: BC Managed Care – PPO | Admitting: Family Medicine

## 2010-12-22 DIAGNOSIS — J0100 Acute maxillary sinusitis, unspecified: Secondary | ICD-10-CM | POA: Insufficient documentation

## 2010-12-29 NOTE — Assessment & Plan Note (Signed)
Summary: sinusitis   Vital Signs:  Patient profile:   55 year old female Height:      63.5 inches Weight:      199 pounds BMI:     34.82 O2 Sat:      96 % on Room air Temp:     98.3 degrees F oral Pulse rate:   100 / minute BP sitting:   165 / 91  (left arm) Cuff size:   large  Vitals Entered By: Payton Spark CMA (December 22, 2010 9:34 AM)  O2 Flow:  Room air CC: Sinus infection x 3 days. OTC meds not helping.   Primary Care Provider:  Seymour Bars D.O.  CC:  Sinus infection x 3 days. OTC meds not helping.[name] Kitchen  History of Present Illness: 55 yo WF presents for 3 days of sore throat, nasal congestion, sneezing, feeling tired and run down.  Has a slight dry cough.  Not sleeping well.  Taking Delsym nighttime which helps some.  Has a lot of yellow rhinorrhea.  No fevers or chills.  She has tried Ibuprofen and Tylenol for her sore throat.  She has a lot of facial pressure.  She does have underlying seasonal allergies but has not been taking anything for them.  Current Medications (verified): 1)  Effexor Xr 75 Mg Xr24h-Cap (Venlafaxine Hcl) .... Take 1 Cap By Mouth Once Daily 2)  Flexeril 10 Mg Tabs (Cyclobenzaprine Hcl) .... Take 1/2 Tab By Mouth As Needed 3)  [name] 10-80 Mg Tabs (Amlodipine-Atorvastatin) .[name] Kitchen.. 1 Tab By Mouth Qhs 4)  Diovan 80 Mg Tabs (Valsartan) .[name] Kitchen.. 1 Tab By Mouth Daily 5)  Metoprolol Tartrate 25 Mg Tabs (Metoprolol Tartrate) .... Take 1 Tab By Mouth Once Daily  Allergies (verified): 1)  ! * Codiene 2)  ! Morphine  Past History:  Past Medical History: Reviewed history from 10/08/2008 and no changes required. 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  Trying to lose wt.  Physical activity at work, but no exercise.  Has about 1 drink per day.  Sexaully active, lesbian relationship.  Review of Systems      See HPI  Physical Exam  General:  alert, well-developed, well-nourished, and well-hydrated.   Head:  normocephalic and atraumatic.  maxillary sinuses TTP Eyes:  conjunctiva clear. slightly watery Ears:  EACs patent; TMs translucent and gray with good cone of light and bony landmarks.  Nose:  nsasl congestion with yellow/ clear rhinorrhea Mouth:  pharynx pink and moist.  mild injection. postnasal drip Neck:  no masses.  tender submandibular LA Lungs:  Normal respiratory effort, chest expands symmetrically. Lungs are clear to auscultation, no crackles or wheezes. Heart:  Normal rate and regular rhythm. S1 and S2 normal without gallop, murmur, click, rub or other extra sounds. Skin:  color normal.   Psych:  good eye contact, not anxious appearing, and not depressed appearing.     Impression & Recommendations:  Problem # 1:  ACUTE MAXILLARY SINUSITIS (ICD-461.0) Will treat ABS with Augmentin to 10 days + plain mucinex and afrin x 4 days.  Avoid systemic decongestants given rise in BP. Can use saline (given ) 2-3 x a day.  REst, hydrate, clear fluids and  stay on an anti histamine daily thru spring for underlying alleriges. Her updated medication list for this problem includes:    Amoxicillin-pot Clavulanate 875-125 Mg Tabs (Amoxicillin-pot clavulanate) .[name] Kitchen... 1 tab by mouth two times a day x 10 days with food  Complete Medication List: 1)  Effexor Xr 75 Mg Xr24h-cap (Venlafaxine hcl) .... Take 1 cap by mouth once daily 2)  Flexeril 10 Mg Tabs (Cyclobenzaprine hcl) .... Take 1/2 tab by mouth as needed 3)  [name] 10-80 Mg Tabs (Amlodipine-atorvastatin) .[name] Kitchen.. 1 tab by mouth qhs 4)  Diovan 80 Mg Tabs (Valsartan) .[name] Kitchen.. 1 tab by mouth daily 5)  Metoprolol Tartrate 25 Mg Tabs (Metoprolol tartrate) .... Take 1 tab by mouth  once daily 6)  Amoxicillin-pot Clavulanate 875-125 Mg Tabs (Amoxicillin-pot clavulanate) .[name] Kitchen.. 1 tab by mouth two times a day x 10 days with food  Patient Instructions: 1)  Take Augmentin 2 x a day for sinusitis. 2)  Take OTC Mucinex (plain) every 12 hrs to thin out mucous. 3)  Add Nasal saline morning and night and Afrin 1 spray/ nostril morning and night x 4 days. 4)  Call if not improved in 10 days. 5)  REst, hydrate and ibuprofen as needed. Prescriptions: AMOXICILLIN-POT CLAVULANATE 875-125 MG TABS (AMOXICILLIN-POT CLAVULANATE) 1 tab by mouth two times a day x 10 days with food  #20 x 0   Entered and Authorized by:   Seymour Bars DO   Signed by:   Seymour Bars DO on 12/22/2010   Method used:   Electronically to        [facility] [phone]* (retail)       [address]       [place], [place]  [address]       Ph: [phone]       Fax: [phone]   RxID:   [id number]    Orders Added: 1)  Est. Patient Level III [44034]

## 2011-01-01 ENCOUNTER — Other Ambulatory Visit: Payer: Self-pay | Admitting: Obstetrics and Gynecology

## 2011-01-01 DIAGNOSIS — Z1231 Encounter for screening mammogram for malignant neoplasm of breast: Secondary | ICD-10-CM

## 2011-01-01 DIAGNOSIS — Z780 Asymptomatic menopausal state: Secondary | ICD-10-CM

## 2011-01-05 ENCOUNTER — Ambulatory Visit
Admission: RE | Admit: 2011-01-05 | Discharge: 2011-01-05 | Disposition: A | Discharge status: Home / Self Care | Payer: BC Managed Care – PPO | Source: Ambulatory Visit | Attending: Obstetrics and Gynecology | Admitting: Obstetrics and Gynecology

## 2011-01-05 DIAGNOSIS — Z780 Asymptomatic menopausal state: Secondary | ICD-10-CM

## 2011-01-05 DIAGNOSIS — Z1231 Encounter for screening mammogram for malignant neoplasm of breast: Secondary | ICD-10-CM

## 2011-02-26 NOTE — Op Note (Signed)
[place]. [facility]  Patient:    Mariah [name], Mariah [name]                     MRN: [id number] Proc. Date: 11/26/00 Adm. Date:  [id number] Attending:  Josie Saunders                           Operative Report  PREOPERATIVE DIAGNOSIS:  Left subdural hematoma with right hemiparesis.  POSTOPERATIVE DIAGNOSIS:  Left subdural hematoma with right hemiparesis.  PROCEDURE:  Left-sided bur hole drainage of subdural hematoma.  SURGEON:  Danae Orleans. Venetia Maxon, M.D.  ASSISTANT:  ANESTHESIA:  General endotracheal.  ESTIMATED BLOOD LOSS:  Minimal,  COMPLICATIONS:  None.  DISPOSITION:  Neurology ICU.  DRAINS:  #7 GAP drain placed.  INDICATIONS:  [name] is a 55 year old woman with right hemiparesis, who fell and hit her head a month prior to admission.  She was found to have a large, left-sided subdural hematoma, measuring approximately 3 cm in maximal diameter with very maximal thickness with significant left to right shift.  We elected to admit her to the hospital on an emergent basis for her to undergo bur hole drainage of subdural hematoma.  DESCRIPTION OF PROCEDURE:  [name] was brought to the operating room. Following the satisfactory and uncomplicated induction of general endotracheal anesthesia and placement of intravenous line, arterial line, and Foley catheter, she was placed in the right lateral position with left side of her body elevated on a blanket roll.  Right side of her head placed on a donut head holder.  Her left frontal parietal scalp was then shaved, prepped and draped in the usual sterile fashion.  An area of the planned incision was infiltrated with 0.25% Marcaine and 0.5% lidocaine with 1:200,000 epinephrine. Two incisions were made, first anterior and anterior to the coronal suture on the frontal bone.  The second was more posterior to the coronal suture and over the convexity.  They were carried through galea and periosteum  was stripped off the bone.  Self-retaining retractors were placed using the black nextro with A and 3 equivalent bur.  Two bur holes were created.  The dura was protuberant and bluish.  They were cauterized with bipolar electrocautery and incised in a cruciate fashion.  This led to eggressive motor oil colored subdural hematoma fluid under significant pressure.  The subdural space was then copiously irrigated with 2 liters of body temperature saline with gradual clearing the subdural fluid.  A #7 JP drain was inserted into the posterior superior bur hole and exited through a separate stab incision and anchored with a 3-0 nylon stitch.  A bur hole cover was placed over the frontal bur hole and was anchored with 4 mm screws.  The galea was then reapproximated with 2-0 Vicryl sutures.  The skin edge was reapproximated with 3-0 nylon running locked stitches.  The wounds were dressed with bacitracin and Telfa and OpSite.  The patient was extubated in the operating room and taken to the recovery room in stable and satisfactory condition.  She tolerated the operation well.  Counts were correct at the end of the case. DD:  11/26/00 TD:  11/27/00 Job: [id number]

## 2011-02-26 NOTE — Discharge Summary (Signed)
[place]. [facility]  Patient:    DONNIA, POPLASKI                     MRN: [id number] Adm. Date:  [id number] Disc. Date: [id number] Attending:  Josie Saunders                           Discharge Summary  REASON FOR ADMISSION:  Subdural hematoma with hemiparesis.  HISTORY OF PRESENT ILLNESS:  Aarika Moon is a 55 year old, right-handed woman who fell one month ago on ice while at work, striking her head.  She developed right-sided weakness and slurring and slowing of her speech approximately three days to when she presented to the [facility] Emergency Room.  She went to Urgent Care and then to [facility] where a head CT showed a 2.5-3 cm left-sided subdural hematoma which was subacute with left to right shift.  The patient was evaluated by me in the emergency room at [facility] and transferred immediately to [facility]. [facility].  HOSPITAL COURSE:  The patient was taken to surgery on November 26, 2000, and underwent left bur hole drainage of subdural hematoma with placement of a Jackson-Pratt drain.  The patient did well with this surgery and had significant improvement right after surgery of her hemiparesis with no residual speech difficulties.  She was observed in the neurosurgical intensive care unit.  The drain was removed on November 28, 2000.  She was up and ambulating and doing well on November 29, 2000.  DISPOSITION:  She was discharged home.  DISCHARGE MEDICATIONS: 1. Dilantin 300 mg of Dilantin q.h.s. 2. Darvocet-N 100 one or two as needed for pain.  WOUND CARE:  The patient is to return in one week for suture removal. DD:  12/16/00 TD:  12/17/00 Job: [id number]

## 2011-02-26 NOTE — H&P (Signed)
[place]. [facility]  Patient:    Mariah [name], Mariah [name]                       MRN: [id number] Adm. Date:  11/26/00 Attending:  Danae Orleans. Venetia Maxon, M.D.                         History and Physical  CHIEF COMPLAINT: Left-sided subdural hematoma.  HISTORY OF PRESENT ILLNESS: Mariah [name] is a 55 year old right-handed white female, who fell one month ago on the ice at work and struck her head.  She developed right-sided weakness and slurring and slowness of her speech three days ago and she was falling to the right.  She went to the Urgent Care at [facility] and saw Dr. Juline Patch this evening.  He identified right hemiparesis and sent her to [facility] for a head CT.  This showed a 2.5-3 cm subacute left-sided subdural hematoma with left-to-right shift.  The patient was transferred emergently to [facility]. [facility] for surgery.  PAST MEDICAL/SURGICAL HISTORY: Significant for hypertension.  CURRENT MEDICATIONS:  1. Cozaar.  2. Trazodone.  3. Other blood pressure medication.  ALLERGIES: ZOVIRAX.  PHYSICAL EXAMINATION:  NEUROLOGIC: The patient is awake and alert.  She has some slurring of some of her words.  PERRL.  EOMI.  Facial movement is intact and symmetric.  Facial sensation is intact and symmetric.  Hearing is intact to finger rub.  The palate is upgoing.  Shoulder shrug is symmetric.  The tongue protrudes in the midline.  She has a right hemiparesis, the right arm effected more than the leg, with distal more than proximal weakness, with considerable hand intrinsic and grip weakness on the right.  She has a significant amount of right-sided pronator drift.  She has minimal weakness in her right lower extremity, again distal more than proximal.  She notes decreased sensation in the right leg compared to the left.  She is hyperreflexic in her upper extremities at 3 bilaterally and symmetric.  She has a positive Hoffmanns sign on the  right, negative on the left.  She has clonus at the knees, 4 on the right and 3 on the left.  Ankle jerks are 3.  Great toes are upgoing bilaterally.  CHEST: Clear to auscultation.  HEART: Regular rate and rhythm.  ABDOMEN: Soft, obese.  Active bowel sounds.  No hepatosplenomegaly appreciated.  EXTREMITIES: Without clubbing, cyanosis, or edema.  IMPRESSION/PLAN: Mariah [name] is a 55 year old woman with a large left-sided subacute subdural hematoma with significant left-to-right shift and right hemiparesis.  The patient was taken emergently to the operating room for bur hole drainage of the subdural hematoma. DD:  11/26/00 TD:  11/27/00 Job: [id number]

## 2011-05-12 ENCOUNTER — Ambulatory Visit (INDEPENDENT_AMBULATORY_CARE_PROVIDER_SITE_OTHER): Payer: PRIVATE HEALTH INSURANCE | Admitting: Family Medicine

## 2011-05-12 ENCOUNTER — Encounter: Payer: Self-pay | Admitting: Family Medicine

## 2011-05-12 VITALS — BP 146/91 | HR 97 | Temp 98.9°F | Ht 62.0 in | Wt 202.0 lb

## 2011-05-12 DIAGNOSIS — J069 Acute upper respiratory infection, unspecified: Secondary | ICD-10-CM

## 2011-05-12 MED ORDER — HYDROCODONE-HOMATROPINE 5-1.5 MG/5ML PO SYRP
5.0000 mL | ORAL_SOLUTION | Freq: Every evening | ORAL | Status: DC | PRN
Start: 2011-05-12 — End: 2016-02-13

## 2011-05-12 MED ORDER — METHYLPREDNISOLONE (PAK) 4 MG PO TABS: 4.0000 mg | ORAL_TABLET | Freq: Every day | ORAL | Status: DC

## 2011-05-12 MED ORDER — AMLODIPINE-ATORVASTATIN 10-80 MG PO TABS: 1.0000 | ORAL_TABLET | Freq: Every day | ORAL | Status: DC

## 2011-05-12 MED ORDER — VALSARTAN 80 MG PO TABS: 80.0000 mg | ORAL_TABLET | Freq: Every day | ORAL | Status: DC

## 2011-05-12 NOTE — Patient Instructions (Signed)
Take Medrol Dose pack for chest tightness and wheezing.  Use ProAir inhaler 2 puffs 4 x a day for the next week.  Use RX Hycodan at night for cough and Robitussin DM during the day.  Call me if any worsening and/ or not improving in 10 days.

## 2011-05-12 NOTE — Progress Notes (Signed)
  Subjective:    Patient ID: Mariah [name], female    DOB: 1956/01/23, 55 y.o.   MRN: [id number]  HPI  55 yo WF presents for cough that started yesterday.  She has chest tightness and SOB.  Subjective fevers.  Taking coricidan HBP.  Her cough is very dry.  She also has a runny nose, ST, HAs.  She has some bodyaches.  No abd pain, N/V/D.  She has a lot of rhinorrhea.  No sputum production.  BP 146/91  Pulse 97  Temp(Src) 98.9 F (37.2 C) (Oral)  Ht 5\' 2"  (1.575 m)  Wt 202 lb (91.627 kg)  BMI 36.95 kg/m2  SpO2 97%   Review of Systems as per HPI     Objective:   Physical Exam  Constitutional: She appears well-developed and well-nourished. No distress.       obese  HENT:  Right Ear: External ear normal.  Left Ear: External ear normal.  Mouth/Throat: Oropharynx is clear and moist.       Yellow rhinorrhea, nasal congestion, o/p injected.  Eyes: Conjunctivae are normal.  Cardiovascular: Normal rate, regular rhythm and normal heart sounds.   Pulmonary/Chest: Effort normal and breath sounds normal. No respiratory distress. She has no wheezes.       Dry cough with forced expiratory wheeze  Lymphadenopathy:    She has cervical adenopathy.  Skin: Skin is warm and dry.  Psychiatric: She has a normal mood and affect.          Assessment & Plan:  Viral URI with dry cough/ wheeze:  Will treat with Medrol dose pack, ProAir inhaler, rest, clear fluids and RX hycodan at night  For cough and robitussin DM during the day.  Call if any worsening and/ or not improving in the next 7-10 days.

## 2011-08-04 LAB — BASIC METABOLIC PANEL
BUN: 27 mg/dL — AB (ref 4–21)
Creatinine: 1 mg/dL (ref 0.5–1.1)

## 2011-08-04 LAB — LIPID PANEL
Cholesterol: 194 mg/dL (ref 0–200)
LDL Cholesterol: 100 mg/dL

## 2011-09-21 ENCOUNTER — Other Ambulatory Visit: Payer: Self-pay

## 2011-09-21 ENCOUNTER — Encounter (HOSPITAL_BASED_OUTPATIENT_CLINIC_OR_DEPARTMENT_OTHER): Payer: Self-pay | Admitting: *Deleted

## 2011-09-21 ENCOUNTER — Other Ambulatory Visit: Payer: Self-pay | Admitting: Orthopedic Surgery

## 2011-09-21 ENCOUNTER — Encounter (HOSPITAL_BASED_OUTPATIENT_CLINIC_OR_DEPARTMENT_OTHER)
Admission: RE | Admit: 2011-09-21 | Discharge: 2011-09-21 | Disposition: A | Discharge status: Home / Self Care | Payer: PRIVATE HEALTH INSURANCE | Source: Ambulatory Visit | Attending: Orthopedic Surgery | Admitting: Orthopedic Surgery

## 2011-09-21 LAB — BASIC METABOLIC PANEL
BUN: 21 mg/dL (ref 6–23)
CO2: 26 mEq/L (ref 19–32)
Calcium: 9.6 mg/dL (ref 8.4–10.5)
Creatinine, Ser: 0.91 mg/dL (ref 0.50–1.10)
Glucose, Bld: 99 mg/dL (ref 70–99)

## 2011-09-21 NOTE — Progress Notes (Signed)
bmet and ekg done 

## 2011-09-23 NOTE — H&P (Signed)
Mariah [name] is an 55 y.o. female.   Chief Complaint: left elbow pain HPI: 55y/o female s/p fall with displaced left radial head fracture  Past Medical History  Diagnosis Date  . Hypertension   . Arthritis   . Dysrhythmia     History reviewed. No pertinent past surgical history.  History reviewed. No pertinent family history. Social History:  reports that she quit smoking about 14 years ago. She does not have any smokeless tobacco history on file. She reports that she drinks about 1.2 ounces of alcohol per week. She reports that she does not use illicit drugs.  Allergies:  Allergies  Allergen Reactions  . Codeine     REACTION: itching  . Morphine     REACTION: itching  . Zovirax (Acyclovir Sodium) Itching    No current facility-administered medications on file as of .   Medications Prior to Admission  Medication Sig Dispense Refill  . amLODipine-atorvastatin ([name]) 10-80 MG per tablet Take 1 tablet by mouth daily. Takes 1/2 daily  30 tablet  5  . cyclobenzaprine (FLEXERIL) 10 MG tablet Take 10 mg by mouth 3 (three) times daily as needed. Takes 1/2 daily       . furosemide (LASIX) 20 MG tablet Take 20 mg by mouth daily. prn       . methylPREDNIsolone (MEDROL DOSPACK) 4 MG tablet Take 1 tablet (4 mg total) by mouth daily. follow package directions  21 tablet  0  . metoprolol succinate (TOPROL-XL) 25 MG 24 hr tablet Take 25 mg by mouth daily.        . valsartan (DIOVAN) 80 MG tablet Take 1 tablet (80 mg total) by mouth daily.  30 tablet  5  . venlafaxine (EFFEXOR) 75 MG tablet Take 75 mg by mouth 2 (two) times daily.          No results found for this or any previous visit (from the past 48 hour(s)). No results found.  Review of Systems  Constitutional: Negative.   HENT: Negative.   Eyes: Negative.   Respiratory: Negative.   Cardiovascular: Negative.   Gastrointestinal: Negative.   Genitourinary: Negative.   Musculoskeletal: Negative.   Skin: Negative.     Neurological: Negative.   Endo/Heme/Allergies: Negative.   Psychiatric/Behavioral: Negative.     Height 5\' 2"  (1.575 m), weight 90.719 kg (200 lb). Physical Exam  Constitutional: She is oriented to person, place, and time. She appears well-developed and well-nourished.  HENT:  Head: Normocephalic and atraumatic.  Neck: Normal range of motion.  Cardiovascular: Normal rate.   Respiratory: Effort normal.  Musculoskeletal:       Left elbow: She exhibits decreased range of motion and swelling. tenderness found. Radial head tenderness noted.  Neurological: She is alert and oriented to person, place, and time.  Skin: Skin is warm.  Psychiatric: She has a normal mood and affect. Her behavior is normal. Judgment and thought content normal.     Assessment/Plan 55y/o female s/p fall with displaced left radial head fracture. Plan radial head replacement  Ryka Beighley A 09/23/2011, 10:08 PM

## 2011-09-24 ENCOUNTER — Encounter (HOSPITAL_BASED_OUTPATIENT_CLINIC_OR_DEPARTMENT_OTHER): Payer: Self-pay | Admitting: Anesthesiology

## 2011-09-24 ENCOUNTER — Encounter (HOSPITAL_BASED_OUTPATIENT_CLINIC_OR_DEPARTMENT_OTHER)
Admission: RE | Disposition: A | Discharge status: Home / Self Care | Payer: Self-pay | Source: Ambulatory Visit | Attending: Orthopedic Surgery

## 2011-09-24 ENCOUNTER — Encounter (HOSPITAL_BASED_OUTPATIENT_CLINIC_OR_DEPARTMENT_OTHER): Payer: Self-pay

## 2011-09-24 ENCOUNTER — Ambulatory Visit (HOSPITAL_BASED_OUTPATIENT_CLINIC_OR_DEPARTMENT_OTHER)
Admission: RE | Admit: 2011-09-24 | Discharge: 2011-09-24 | Disposition: A | Discharge status: Home / Self Care | Payer: PRIVATE HEALTH INSURANCE | Source: Ambulatory Visit | Attending: Orthopedic Surgery | Admitting: Orthopedic Surgery

## 2011-09-24 ENCOUNTER — Ambulatory Visit (HOSPITAL_BASED_OUTPATIENT_CLINIC_OR_DEPARTMENT_OTHER): Payer: PRIVATE HEALTH INSURANCE | Admitting: Anesthesiology

## 2011-09-24 DIAGNOSIS — W19XXXA Unspecified fall, initial encounter: Secondary | ICD-10-CM | POA: Insufficient documentation

## 2011-09-24 DIAGNOSIS — S52123A Displaced fracture of head of unspecified radius, initial encounter for closed fracture: Secondary | ICD-10-CM | POA: Insufficient documentation

## 2011-09-24 DIAGNOSIS — R011 Cardiac murmur, unspecified: Secondary | ICD-10-CM | POA: Insufficient documentation

## 2011-09-24 DIAGNOSIS — Z01812 Encounter for preprocedural laboratory examination: Secondary | ICD-10-CM | POA: Insufficient documentation

## 2011-09-24 DIAGNOSIS — I10 Essential (primary) hypertension: Secondary | ICD-10-CM | POA: Insufficient documentation

## 2011-09-24 HISTORY — DX: Unspecified osteoarthritis, unspecified site: M19.90

## 2011-09-24 HISTORY — DX: Cardiac arrhythmia, unspecified: I49.9

## 2011-09-24 HISTORY — DX: Essential (primary) hypertension: I10

## 2011-09-24 HISTORY — PX: RADIAL HEAD IMPLANT: SHX758

## 2011-09-24 LAB — POCT HEMOGLOBIN-HEMACUE: Hemoglobin: 9.9 g/dL — ABNORMAL LOW (ref 12.0–15.0)

## 2011-09-24 SURGERY — ARTHROPLASTY, RADIUS, HEAD
Anesthesia: General | Site: Elbow | Laterality: Left | Wound class: Clean

## 2011-09-24 MED ORDER — LIDOCAINE HCL 1.5 % IJ SOLN
INTRAMUSCULAR | Status: DC | PRN
  Administered 2011-09-24: 50 mL via INTRADERMAL

## 2011-09-24 MED ORDER — LACTATED RINGERS IV SOLN
INTRAVENOUS | Status: DC
  Administered 2011-09-24 (×2): via INTRAVENOUS

## 2011-09-24 MED ORDER — CHLORHEXIDINE GLUCONATE 4 % EX LIQD: 60.0000 mL | Freq: Once | CUTANEOUS | Status: DC

## 2011-09-24 MED ORDER — DEXAMETHASONE SODIUM PHOSPHATE 4 MG/ML IJ SOLN
INTRAMUSCULAR | Status: DC | PRN
  Administered 2011-09-24: 10 mg via INTRAVENOUS

## 2011-09-24 MED ORDER — MIDAZOLAM HCL 2 MG/2ML IJ SOLN
1.0000 mg | INTRAMUSCULAR | Status: DC | PRN
  Administered 2011-09-24: 2 mg via INTRAVENOUS

## 2011-09-24 MED ORDER — LORAZEPAM 2 MG/ML IJ SOLN: 1.0000 mg | Freq: Once | INTRAMUSCULAR | Status: DC | PRN

## 2011-09-24 MED ORDER — OXYCODONE-ACETAMINOPHEN 5-325 MG PO TABS: 1.0000 | ORAL_TABLET | ORAL | Status: AC | PRN

## 2011-09-24 MED ORDER — TETRACAINE HCL 1 % IJ SOLN
INTRAMUSCULAR | Status: DC | PRN
  Administered 2011-09-24: 20 mg via INTRASPINAL

## 2011-09-24 MED ORDER — FENTANYL CITRATE 0.05 MG/ML IJ SOLN
50.0000 ug | INTRAMUSCULAR | Status: DC | PRN
  Administered 2011-09-24: 100 ug via INTRAVENOUS

## 2011-09-24 MED ORDER — FENTANYL CITRATE 0.05 MG/ML IJ SOLN
INTRAMUSCULAR | Status: DC | PRN
  Administered 2011-09-24 (×2): 25 ug via INTRAVENOUS

## 2011-09-24 MED ORDER — PROMETHAZINE HCL 25 MG/ML IJ SOLN: 6.2500 mg | INTRAMUSCULAR | Status: DC | PRN

## 2011-09-24 MED ORDER — CEFAZOLIN SODIUM 1-5 GM-% IV SOLN
1.0000 g | INTRAVENOUS | Status: AC
  Administered 2011-09-24: 2 g via INTRAVENOUS

## 2011-09-24 MED ORDER — LIDOCAINE HCL (CARDIAC) 20 MG/ML IV SOLN
INTRAVENOUS | Status: DC | PRN
  Administered 2011-09-24: 80 mg via INTRAVENOUS

## 2011-09-24 MED ORDER — HYDROMORPHONE HCL PF 1 MG/ML IJ SOLN: 0.2500 mg | INTRAMUSCULAR | Status: DC | PRN

## 2011-09-24 MED ORDER — PROPOFOL 10 MG/ML IV EMUL
INTRAVENOUS | Status: DC | PRN
  Administered 2011-09-24: 200 mg via INTRAVENOUS

## 2011-09-24 SURGICAL SUPPLY — 78 items
BAG DECANTER FOR FLEXI CONT (MISCELLANEOUS) IMPLANT
BANDAGE ACE 4 STERILE (GAUZE/BANDAGES/DRESSINGS) ×1 IMPLANT
BANDAGE ELASTIC 3 VELCRO ST LF (GAUZE/BANDAGES/DRESSINGS) ×2 IMPLANT
BANDAGE ELASTIC 4 VELCRO ST LF (GAUZE/BANDAGES/DRESSINGS) ×1 IMPLANT
BANDAGE GAUZE ELAST BULKY 4 IN (GAUZE/BANDAGES/DRESSINGS) ×1 IMPLANT
BLADE AVERAGE 25X9 (BLADE) ×1 IMPLANT
BLADE MINI RND TIP GREEN BEAV (BLADE) IMPLANT
BLADE SURG 15 STRL LF DISP TIS (BLADE) ×1 IMPLANT
BLADE SURG 15 STRL SS (BLADE) ×2
BNDG CMPR 9X4 STRL LF SNTH (GAUZE/BANDAGES/DRESSINGS) ×1
BNDG ESMARK 4X9 LF (GAUZE/BANDAGES/DRESSINGS) ×2 IMPLANT
CANISTER SUCTION 1200CC (MISCELLANEOUS) ×1 IMPLANT
CLOTH BEACON ORANGE TIMEOUT ST (SAFETY) ×2 IMPLANT
CORDS BIPOLAR (ELECTRODE) ×2 IMPLANT
COVER TABLE BACK 60X90 (DRAPES) ×2 IMPLANT
CUFF TOURNIQUET SINGLE 18IN (TOURNIQUET CUFF) IMPLANT
CUFF TOURNIQUET SINGLE 24IN (TOURNIQUET CUFF) ×1 IMPLANT
DECANTER SPIKE VIAL GLASS SM (MISCELLANEOUS) IMPLANT
DRAPE EXTREMITY T 121X128X90 (DRAPE) ×2 IMPLANT
DRAPE OEC MINIVIEW 54X84 (DRAPES) ×2 IMPLANT
DRAPE SURG 17X23 STRL (DRAPES) ×2 IMPLANT
DRSG KUZMA FLUFF (GAUZE/BANDAGES/DRESSINGS) IMPLANT
DURAPREP 26ML APPLICATOR (WOUND CARE) ×2 IMPLANT
ELECT REM PT RETURN 9FT ADLT (ELECTROSURGICAL)
ELECTRODE REM PT RTRN 9FT ADLT (ELECTROSURGICAL) IMPLANT
GAUZE SPONGE 4X4 16PLY XRAY LF (GAUZE/BANDAGES/DRESSINGS) IMPLANT
GAUZE XEROFORM 1X8 LF (GAUZE/BANDAGES/DRESSINGS) IMPLANT
GLOVE BIO SURGEON STRL SZ8.5 (GLOVE) ×2 IMPLANT
GLOVE BIOGEL PI IND STRL 8.5 (GLOVE) IMPLANT
GLOVE BIOGEL PI INDICATOR 8.5 (GLOVE) ×1
GLOVE SURG ORTHO 8.0 STRL STRW (GLOVE) ×1 IMPLANT
GOWN PREVENTION PLUS XLARGE (GOWN DISPOSABLE) ×2 IMPLANT
GOWN PREVENTION PLUS XXLARGE (GOWN DISPOSABLE) ×4 IMPLANT
HEAD RADIAL ALIGN 20MM (Head) ×1 IMPLANT
NDL 1/2 CIR CATGUT .05X1.09 (NEEDLE) IMPLANT
NDL HYPO 25X1 1.5 SAFETY (NEEDLE) ×1 IMPLANT
NDL SUT 6 .5 CRC .975X.05 MAYO (NEEDLE) IMPLANT
NEEDLE 1/2 CIR CATGUT .05X1.09 (NEEDLE) IMPLANT
NEEDLE HYPO 25X1 1.5 SAFETY (NEEDLE) IMPLANT
NEEDLE MAYO TAPER (NEEDLE) ×2
NS IRRIG 1000ML POUR BTL (IV SOLUTION) ×3 IMPLANT
PACK BASIN DAY SURGERY FS (CUSTOM PROCEDURE TRAY) ×2 IMPLANT
PAD CAST 3X4 CTTN HI CHSV (CAST SUPPLIES) ×1 IMPLANT
PAD CAST 4YDX4 CTTN HI CHSV (CAST SUPPLIES) IMPLANT
PADDING CAST ABS 4INX4YD NS (CAST SUPPLIES)
PADDING CAST ABS COTTON 4X4 ST (CAST SUPPLIES) ×1 IMPLANT
PADDING CAST COTTON 3X4 STRL (CAST SUPPLIES) ×2
PADDING CAST COTTON 4X4 STRL (CAST SUPPLIES) ×2
PADDING UNDERCAST 2  STERILE (CAST SUPPLIES) IMPLANT
PADDING WEBRIL 4 STERILE (GAUZE/BANDAGES/DRESSINGS) ×1 IMPLANT
PENCIL BUTTON HOLSTER BLD 10FT (ELECTRODE) IMPLANT
SHEET MEDIUM DRAPE 40X70 STRL (DRAPES) ×3 IMPLANT
SLING ARM FOAM STRAP LRG (SOFTGOODS) ×1 IMPLANT
SPLINT FAST PLASTER 5X30 (CAST SUPPLIES) ×10
SPLINT PLASTER CAST FAST 5X30 (CAST SUPPLIES) IMPLANT
SPLINT PLASTER CAST XFAST 4X15 (CAST SUPPLIES) ×5 IMPLANT
SPLINT PLASTER XTRA FAST SET 4 (CAST SUPPLIES) ×10
SPONGE GAUZE 4X4 12PLY (GAUZE/BANDAGES/DRESSINGS) ×2 IMPLANT
STAPLER VISISTAT (STAPLE) IMPLANT
STEM RADIAL ALIGN 8X30X0 (Stem) ×1 IMPLANT
STOCKINETTE 4X48 STRL (DRAPES) ×2 IMPLANT
STRIP CLOSURE SKIN 1/2X4 (GAUZE/BANDAGES/DRESSINGS) ×1 IMPLANT
SUCTION FRAZIER TIP 10 FR DISP (SUCTIONS) ×1 IMPLANT
SUT ETHILON 4 0 PS 2 18 (SUTURE) IMPLANT
SUT MERSILENE 4 0 P 3 (SUTURE) IMPLANT
SUT PROLENE 3 0 PS 2 (SUTURE) ×2 IMPLANT
SUT SILK 2 0 FS (SUTURE) IMPLANT
SUT VIC AB 0 SH 27 (SUTURE) ×1 IMPLANT
SUT VIC AB 2-0 SH 27 (SUTURE) ×2
SUT VIC AB 2-0 SH 27XBRD (SUTURE) IMPLANT
SUT VIC AB 3-0 FS2 27 (SUTURE) IMPLANT
SUT VICRYL RAPIDE 4/0 PS 2 (SUTURE) IMPLANT
SYR BULB 3OZ (MISCELLANEOUS) ×2 IMPLANT
SYRINGE 10CC LL (SYRINGE) ×1 IMPLANT
TOWEL OR 17X24 6PK STRL BLUE (TOWEL DISPOSABLE) ×3 IMPLANT
TUBE CONNECTING 20X1/4 (TUBING) ×1 IMPLANT
UNDERPAD 30X30 INCONTINENT (UNDERPADS AND DIAPERS) ×2 IMPLANT
WATER STERILE IRR 1000ML POUR (IV SOLUTION) ×1 IMPLANT

## 2011-09-24 NOTE — Anesthesia Postprocedure Evaluation (Signed)
  Anesthesia Post-op Note  Patient: Mariah [name]  Procedure(s) Performed:  RADIAL HEAD IMPLANT - left radial head replacement  Patient Location: PACU  Anesthesia Type: General  Level of Consciousness: awake  Airway and Oxygen Therapy: Patient Spontanous Breathing  Post-op Pain: none  Post-op Assessment: Post-op Vital signs reviewed  Post-op Vital Signs: stable  Complications: No apparent anesthesia complications

## 2011-09-24 NOTE — Transfer of Care (Signed)
Immediate Anesthesia Transfer of Care Note  Patient: Mariah [name]  Procedure(s) Performed:  RADIAL HEAD IMPLANT - left radial head replacement  Patient Location: PACU  Anesthesia Type: General  Level of Consciousness: awake  Airway & Oxygen Therapy: Patient Spontanous Breathing and Patient connected to face mask oxygen  Post-op Assessment: Report given to PACU RN and Post -op Vital signs reviewed and stable  Post vital signs: Reviewed and stable  Complications: No apparent anesthesia complications

## 2011-09-24 NOTE — H&P (Signed)
  No changes in exam  Ok to proceed with left radial head replacement

## 2011-09-24 NOTE — Brief Op Note (Signed)
09/24/2011  3:10 PM  PATIENT:  Mariah [name]  55 y.o. female  PRE-OPERATIVE DIAGNOSIS:  left radial head fracture  POST-OPERATIVE DIAGNOSIS:  left radial head fracture  PROCEDURE:  Procedure(s): RADIAL HEAD IMPLANT  SURGEON:  Surgeon(s): Marlowe Shores, MD  PHYSICIAN ASSISTANT:   ASSISTANTS:gary kuzma   ANESTHESIA:   general  EBL:  Total I/O In: 1600 [I.V.:1600] Out: -   BLOOD ADMINISTERED:none  DRAINS: none   LOCAL MEDICATIONS USED:  NONE  SPECIMEN:  No Specimen  DISPOSITION OF SPECIMEN:  N/A  COUNTS:  YES  TOURNIQUET:  * Missing tourniquet times found for documented tourniquets in log:  13868 *  DICTATION: .Other Dictation: Dictation Number [id number]  PLAN OF CARE: Discharge to home after PACU  PATIENT DISPOSITION:  PACU - hemodynamically stable.   Delay start of Pharmacological VTE agent (>24hrs) due to surgical blood loss or risk of bleeding:  {YES/NO/NOT APPLICABLE:20182

## 2011-09-24 NOTE — Op Note (Signed)
See dictated [id number]

## 2011-09-24 NOTE — Anesthesia Procedure Notes (Addendum)
Anesthesia Regional Block:  Axillary brachial plexus block  Pre-Anesthetic Checklist: ,, timeout performed, Correct Patient, Correct Site, Correct Laterality, Correct Procedure, Correct Position, site marked, Risks and benefits discussed,  Surgical consent,  Pre-op evaluation,  At surgeon's request and post-op pain management  Laterality: Left and Upper  Prep: Maximum Sterile Barrier Precautions used and chloraprep       Needles:  Injection technique: Single-shot  Needle Type: Other   (22 straight short bevel)    Needle Gauge: 22 and 22 G    Additional Needles:  Procedures: transarterial technique Axillary brachial plexus block Narrative:  Start time: 09/24/2011 1:23 PM End time: 09/24/2011 1:35 PM Injection made incrementally with aspirations every 5 mL. Anesthesiologist: Dr Gypsy Balsam  Additional Notes: L Ax block for pop 1323-13-25 Sterile tech, chg prep, fbp 22 short bevel w/ single art puncture Multiple neg asp Lido 1.5% w/ epi 50cc+ tetracaine 20mg  No paresth or other compl Dr Gypsy Balsam   Performed by: Signa Kell    Procedure Name: LMA Insertion Date/Time: 09/24/2011 1:56 PM Performed by: Signa Kell Pre-anesthesia Checklist: Patient identified, Emergency Drugs available, Suction available and Patient being monitored Patient Re-evaluated:Patient Re-evaluated prior to inductionOxygen Delivery Method: Circle System Utilized Preoxygenation: Pre-oxygenation with 100% oxygen Intubation Type: IV induction Ventilation: Mask ventilation without difficulty LMA: LMA with gastric port inserted LMA Size: 4.0 Number of attempts: 2 Airway Equipment and Method: bite block Placement Confirmation: positive ETCO2 Tube secured with: Tape Dental Injury: Teeth and Oropharynx as per pre-operative assessment  Comments: Ambu LMA inserted initially, replaced with supreme LMA with improved seal

## 2011-09-24 NOTE — Progress Notes (Signed)
Assisted Dr. Kasik with left, axillary block. Side rails up, monitors on throughout procedure. See vital signs in flow sheet. Tolerated Procedure well. 

## 2011-09-24 NOTE — Anesthesia Preprocedure Evaluation (Signed)
Anesthesia Evaluation  Patient identified by MRN, date of birth, ID band Patient awake    Reviewed: Allergy & Precautions, H&P , NPO status , Patient's Chart, lab work & pertinent test results  Airway Mallampati: II TM Distance: >3 FB Neck ROM: Full    Dental   Pulmonary    Pulmonary exam normal       Cardiovascular hypertension, + Systolic murmurs    Neuro/Psych    GI/Hepatic   Endo/Other  Morbid obesity  Renal/GU      Musculoskeletal   Abdominal (+) obese,   Peds  Hematology   Anesthesia Other Findings   Reproductive/Obstetrics                           Anesthesia Physical Anesthesia Plan  ASA: II  Anesthesia Plan: General   Post-op Pain Management:    Induction: Intravenous  Airway Management Planned: LMA  Additional Equipment:   Intra-op Plan:   Post-operative Plan: Extubation in OR  Informed Consent: I have reviewed the patients History and Physical, chart, labs and discussed the procedure including the risks, benefits and alternatives for the proposed anesthesia with the patient or authorized representative who has indicated his/her understanding and acceptance.     Plan Discussed with: CRNA and Surgeon  Anesthesia Plan Comments:         Anesthesia Quick Evaluation

## 2011-09-25 NOTE — Op Note (Signed)
NAMELYLEE, CORROW              ACCOUNT NO.:  [id number]  MEDICAL RECORD NO.:  [id number]  LOCATION:                                 FACILITY:  PHYSICIAN:  Artist Pais. Mina Marble, M.D.   DATE OF BIRTH:  DATE OF PROCEDURE:  09/24/2011 DATE OF DISCHARGE:                              OPERATIVE REPORT   PREOPERATIVE DIAGNOSIS:  Left radial head fracture, 100% displaced.  POSTOPERATIVE DIAGNOSIS:  Left radial head fracture, 100% displaced.  PROCEDURE:  Left radial head replacement.  SURGEON:  Artist Pais. Mina Marble, M.D. and Cindee Salt, M.D.  ANESTHESIA:  Axillary block and general.  TOURNIQUET TIME:  Less than an hour.  COMPLICATION:  None.  DRAINS:  None.  PROCEDURE IN DETAIL:  The patient was taken to the operating suite. After induction of adequate general anesthesia and axillary block analgesia, the left upper extremity was prepped and draped in sterile fashion.  An Esmarch was used to exsanguinate the limb.  Tourniquet was then inflated to 250 mmHg.  At this point in time, incision was made on the lateral aspect of the left elbow.  Skin was incised.  Dissection was carried down to the lateral aspect of the palpable border.  The radiocapitellar joint was identified.  The muscle overlying this was split.  Capsulotomy was performed.  The joint was entered.  There was significant bloody fluid in the joint, which was removed with suction. Visualization revealed a comminuted two-part head fracture with 100% displacement and the radial head fragments were removed and used to determine the appropriate radial head size using the [employer] ALIGN radial head system.  The intramedullary canal of the head and neck area of the radius was identified and reamed to a #8.  The plane was then used to prepare the surface for implantation of the stem.  The trial turned out to be 8 stem with 22 head, 0 neck.  The wound was then thoroughly irrigated and debrided of clot, nonviable  material.  The trial was placed with the appropriate size components, again 8 stem, 20 head, 0 neck deemed being good position with good stability in flexion, extension, pronation, and supination.  The trial was removed.  The implant was placed and aligned appropriately.  Prior to incision, the Bovie of the ulna was identified and used the external alignment guide, which was used to place this [employer] radial head prosthesis in anatomic alignment.  It was then reduced into the radiocapitellar joint. The wound was thoroughly irrigated, it was closed in layers with 0 Vicryl for the capsule and muscle layers, 2-0 undyed Vicryl subcutaneously and a 3-0 Prolene subcuticular stitch on the skin.  Steri-Strips, 4x4s, fluffs, and the splint was applied with the elbow at 90 degrees of flexion, full pronation to help provide stability.  The patient tolerated the procedure well and went to recovery room in stable fashion.     Artist Pais Mina Marble, M.D.     MAW/MEDQ  D:  09/24/2011  T:  09/25/2011  Job:  [id number]

## 2011-09-27 ENCOUNTER — Encounter (HOSPITAL_BASED_OUTPATIENT_CLINIC_OR_DEPARTMENT_OTHER): Payer: Self-pay | Admitting: Orthopedic Surgery

## 2011-10-21 ENCOUNTER — Encounter (HOSPITAL_BASED_OUTPATIENT_CLINIC_OR_DEPARTMENT_OTHER): Payer: Self-pay | Admitting: *Deleted

## 2011-10-21 ENCOUNTER — Other Ambulatory Visit: Payer: Self-pay | Admitting: Orthopedic Surgery

## 2011-10-21 NOTE — Progress Notes (Signed)
Pt here 12/12-having to redo elbow surg Needs istat

## 2011-10-22 ENCOUNTER — Encounter (HOSPITAL_BASED_OUTPATIENT_CLINIC_OR_DEPARTMENT_OTHER)
Admission: RE | Disposition: A | Discharge status: Home / Self Care | Payer: Self-pay | Source: Ambulatory Visit | Attending: Orthopedic Surgery

## 2011-10-22 ENCOUNTER — Ambulatory Visit (HOSPITAL_BASED_OUTPATIENT_CLINIC_OR_DEPARTMENT_OTHER)
Admission: RE | Admit: 2011-10-22 | Discharge: 2011-10-22 | Disposition: A | Discharge status: Home / Self Care | Payer: PRIVATE HEALTH INSURANCE | Source: Ambulatory Visit | Attending: Orthopedic Surgery | Admitting: Orthopedic Surgery

## 2011-10-22 ENCOUNTER — Encounter (HOSPITAL_BASED_OUTPATIENT_CLINIC_OR_DEPARTMENT_OTHER): Payer: Self-pay | Admitting: *Deleted

## 2011-10-22 ENCOUNTER — Ambulatory Visit (HOSPITAL_BASED_OUTPATIENT_CLINIC_OR_DEPARTMENT_OTHER): Payer: PRIVATE HEALTH INSURANCE | Admitting: Anesthesiology

## 2011-10-22 ENCOUNTER — Encounter (HOSPITAL_BASED_OUTPATIENT_CLINIC_OR_DEPARTMENT_OTHER): Payer: Self-pay | Admitting: Anesthesiology

## 2011-10-22 DIAGNOSIS — I10 Essential (primary) hypertension: Secondary | ICD-10-CM | POA: Insufficient documentation

## 2011-10-22 DIAGNOSIS — Y831 Surgical operation with implant of artificial internal device as the cause of abnormal reaction of the patient, or of later complication, without mention of misadventure at the time of the procedure: Secondary | ICD-10-CM | POA: Insufficient documentation

## 2011-10-22 DIAGNOSIS — I499 Cardiac arrhythmia, unspecified: Secondary | ICD-10-CM | POA: Insufficient documentation

## 2011-10-22 DIAGNOSIS — T84029A Dislocation of unspecified internal joint prosthesis, initial encounter: Secondary | ICD-10-CM | POA: Insufficient documentation

## 2011-10-22 DIAGNOSIS — S52123B Displaced fracture of head of unspecified radius, initial encounter for open fracture type I or II: Secondary | ICD-10-CM

## 2011-10-22 DIAGNOSIS — Z96629 Presence of unspecified artificial elbow joint: Secondary | ICD-10-CM | POA: Insufficient documentation

## 2011-10-22 HISTORY — PX: ORIF ELBOW FRACTURE: SHX5031

## 2011-10-22 LAB — POCT I-STAT, CHEM 8
BUN: 23 mg/dL (ref 6–23)
Calcium, Ion: 1.21 mmol/L (ref 1.12–1.32)
Chloride: 109 mEq/L (ref 96–112)
Creatinine, Ser: 0.9 mg/dL (ref 0.50–1.10)
Glucose, Bld: 96 mg/dL (ref 70–99)
HCT: 39 % (ref 36.0–46.0)
Hemoglobin: 13.3 g/dL (ref 12.0–15.0)
Potassium: 4.7 mEq/L (ref 3.5–5.1)
Sodium: 143 mEq/L (ref 135–145)
TCO2: 23 mmol/L (ref 0–100)

## 2011-10-22 SURGERY — OPEN REDUCTION INTERNAL FIXATION (ORIF) ELBOW/OLECRANON FRACTURE
Anesthesia: General | Laterality: Left

## 2011-10-22 MED ORDER — FENTANYL CITRATE 0.05 MG/ML IJ SOLN: 25.0000 ug | INTRAMUSCULAR | Status: DC | PRN

## 2011-10-22 MED ORDER — LIDOCAINE HCL 1 % IJ SOLN
INTRAMUSCULAR | Status: DC | PRN
  Administered 2011-10-22: 2 mL via INTRADERMAL

## 2011-10-22 MED ORDER — MIDAZOLAM HCL 2 MG/2ML IJ SOLN
0.5000 mg | INTRAMUSCULAR | Status: DC | PRN
  Administered 2011-10-22: 2 mg via INTRAVENOUS

## 2011-10-22 MED ORDER — CHLORHEXIDINE GLUCONATE 4 % EX LIQD: 60.0000 mL | Freq: Once | CUTANEOUS | Status: DC

## 2011-10-22 MED ORDER — DROPERIDOL 2.5 MG/ML IJ SOLN
INTRAMUSCULAR | Status: DC | PRN
  Administered 2011-10-22: 0.625 mg via INTRAVENOUS

## 2011-10-22 MED ORDER — ROPIVACAINE HCL 5 MG/ML IJ SOLN
INTRAMUSCULAR | Status: DC | PRN
  Administered 2011-10-22: 25 mL via EPIDURAL

## 2011-10-22 MED ORDER — LIDOCAINE HCL (CARDIAC) 20 MG/ML IV SOLN
INTRAVENOUS | Status: DC | PRN
  Administered 2011-10-22: 100 mg via INTRAVENOUS

## 2011-10-22 MED ORDER — ONDANSETRON HCL 4 MG/2ML IJ SOLN
INTRAMUSCULAR | Status: DC | PRN
  Administered 2011-10-22: 4 mg via INTRAVENOUS

## 2011-10-22 MED ORDER — MORPHINE SULFATE 2 MG/ML IJ SOLN: 0.0500 mg/kg | INTRAMUSCULAR | Status: DC | PRN

## 2011-10-22 MED ORDER — LACTATED RINGERS IV SOLN
INTRAVENOUS | Status: DC
  Administered 2011-10-22 (×2): via INTRAVENOUS

## 2011-10-22 MED ORDER — CEFAZOLIN SODIUM 1-5 GM-% IV SOLN: 1.0000 g | INTRAVENOUS | Status: DC

## 2011-10-22 MED ORDER — DEXAMETHASONE SODIUM PHOSPHATE 4 MG/ML IJ SOLN
INTRAMUSCULAR | Status: DC | PRN
  Administered 2011-10-22: 10 mg via INTRAVENOUS

## 2011-10-22 MED ORDER — PROPOFOL 10 MG/ML IV EMUL
INTRAVENOUS | Status: DC | PRN
  Administered 2011-10-22: 300 mg via INTRAVENOUS

## 2011-10-22 MED ORDER — FENTANYL CITRATE 0.05 MG/ML IJ SOLN
50.0000 ug | INTRAMUSCULAR | Status: DC | PRN
  Administered 2011-10-22: 100 ug via INTRAVENOUS

## 2011-10-22 MED ORDER — FENTANYL CITRATE 0.05 MG/ML IJ SOLN
INTRAMUSCULAR | Status: DC | PRN
  Administered 2011-10-22: 25 ug via INTRAVENOUS
  Administered 2011-10-22: 50 ug via INTRAVENOUS

## 2011-10-22 MED ORDER — METOCLOPRAMIDE HCL 5 MG/ML IJ SOLN: 10.0000 mg | Freq: Once | INTRAMUSCULAR | Status: DC | PRN

## 2011-10-22 MED ORDER — CEFAZOLIN SODIUM-DEXTROSE 2-3 GM-% IV SOLR
2.0000 g | INTRAVENOUS | Status: AC
  Administered 2011-10-22: 2 g via INTRAVENOUS

## 2011-10-22 SURGICAL SUPPLY — 73 items
APL SKNCLS STERI-STRIP NONHPOA (GAUZE/BANDAGES/DRESSINGS) ×1
BAG DECANTER FOR FLEXI CONT (MISCELLANEOUS) IMPLANT
BANDAGE ELASTIC 3 VELCRO ST LF (GAUZE/BANDAGES/DRESSINGS) ×2 IMPLANT
BANDAGE ELASTIC 4 VELCRO ST LF (GAUZE/BANDAGES/DRESSINGS) ×2 IMPLANT
BANDAGE GAUZE ELAST BULKY 4 IN (GAUZE/BANDAGES/DRESSINGS) ×2 IMPLANT
BENZOIN TINCTURE PRP APPL 2/3 (GAUZE/BANDAGES/DRESSINGS) ×1 IMPLANT
BLADE MINI RND TIP GREEN BEAV (BLADE) IMPLANT
BLADE SURG 15 STRL LF DISP TIS (BLADE) ×1 IMPLANT
BLADE SURG 15 STRL SS (BLADE) ×2
BNDG CMPR 9X4 STRL LF SNTH (GAUZE/BANDAGES/DRESSINGS) ×1
BNDG CMPR MD 5X2 ELC HKLP STRL (GAUZE/BANDAGES/DRESSINGS)
BNDG ELASTIC 2 VLCR STRL LF (GAUZE/BANDAGES/DRESSINGS) IMPLANT
BNDG ESMARK 4X9 LF (GAUZE/BANDAGES/DRESSINGS) ×2 IMPLANT
CANISTER SUCTION 1200CC (MISCELLANEOUS) IMPLANT
CLOTH BEACON ORANGE TIMEOUT ST (SAFETY) ×2 IMPLANT
CORDS BIPOLAR (ELECTRODE) ×2 IMPLANT
COVER TABLE BACK 60X90 (DRAPES) ×2 IMPLANT
CUFF TOURNIQUET SINGLE 18IN (TOURNIQUET CUFF) ×1 IMPLANT
DECANTER SPIKE VIAL GLASS SM (MISCELLANEOUS) IMPLANT
DRAPE EXTREMITY T 121X128X90 (DRAPE) ×2 IMPLANT
DRAPE OEC MINIVIEW 54X84 (DRAPES) ×2 IMPLANT
DRAPE SURG 17X23 STRL (DRAPES) ×2 IMPLANT
DURAPREP 26ML APPLICATOR (WOUND CARE) ×2 IMPLANT
ELECT REM PT RETURN 9FT ADLT (ELECTROSURGICAL)
ELECTRODE REM PT RTRN 9FT ADLT (ELECTROSURGICAL) IMPLANT
GAUZE SPONGE 4X4 16PLY XRAY LF (GAUZE/BANDAGES/DRESSINGS) ×2 IMPLANT
GAUZE XEROFORM 1X8 LF (GAUZE/BANDAGES/DRESSINGS) ×1 IMPLANT
GLOVE BIO SURGEON STRL SZ8.5 (GLOVE) ×2 IMPLANT
GLOVE SURG ORTHO 8.0 STRL STRW (GLOVE) IMPLANT
GOWN BRE IMP PREV XXLGXLNG (GOWN DISPOSABLE) ×2 IMPLANT
GOWN PREVENTION PLUS XLARGE (GOWN DISPOSABLE) ×2 IMPLANT
GOWN PREVENTION PLUS XXLARGE (GOWN DISPOSABLE) ×2 IMPLANT
KWIRE 4.0 X .035IN (WIRE) ×1 IMPLANT
LOOP VESSEL MAXI BLUE (MISCELLANEOUS) IMPLANT
NDL HYPO 25X1 1.5 SAFETY (NEEDLE) ×1 IMPLANT
NEEDLE HYPO 25X1 1.5 SAFETY (NEEDLE) IMPLANT
NS IRRIG 1000ML POUR BTL (IV SOLUTION) ×2 IMPLANT
PACK BASIN DAY SURGERY FS (CUSTOM PROCEDURE TRAY) ×2 IMPLANT
PAD CAST 3X4 CTTN HI CHSV (CAST SUPPLIES) ×1 IMPLANT
PAD CAST 4YDX4 CTTN HI CHSV (CAST SUPPLIES) ×1 IMPLANT
PADDING CAST ABS 4INX4YD NS (CAST SUPPLIES) ×1
PADDING CAST ABS COTTON 4X4 ST (CAST SUPPLIES) ×1 IMPLANT
PADDING CAST COTTON 3X4 STRL (CAST SUPPLIES) ×2
PADDING CAST COTTON 4X4 STRL (CAST SUPPLIES) ×2
PADDING UNDERCAST 2  STERILE (CAST SUPPLIES) IMPLANT
PENCIL BUTTON HOLSTER BLD 10FT (ELECTRODE) IMPLANT
SHEET MEDIUM DRAPE 40X70 STRL (DRAPES) ×2 IMPLANT
SPLINT FAST PLASTER 5X30 (CAST SUPPLIES)
SPLINT PLASTER CAST FAST 5X30 (CAST SUPPLIES) IMPLANT
SPLINT PLASTER CAST XFAST 4X15 (CAST SUPPLIES) IMPLANT
SPLINT PLASTER XTRA FAST SET 4 (CAST SUPPLIES)
SPONGE GAUZE 4X4 12PLY (GAUZE/BANDAGES/DRESSINGS) ×2 IMPLANT
STOCKINETTE 4X48 STRL (DRAPES) ×2 IMPLANT
STRIP CLOSURE SKIN 1/2X4 (GAUZE/BANDAGES/DRESSINGS) ×1 IMPLANT
SUCTION FRAZIER TIP 10 FR DISP (SUCTIONS) IMPLANT
SUT ETHILON 4 0 PS 2 18 (SUTURE) IMPLANT
SUT FIBERWIRE 2-0 18 17.9 3/8 (SUTURE) ×2
SUT MERSILENE 4 0 P 3 (SUTURE) IMPLANT
SUT PROLENE 3 0 PS 2 (SUTURE) ×1 IMPLANT
SUT SILK 2 0 FS (SUTURE) IMPLANT
SUT VIC AB 0 SH 27 (SUTURE) ×1 IMPLANT
SUT VIC AB 2-0 SH 27 (SUTURE) ×2
SUT VIC AB 2-0 SH 27XBRD (SUTURE) IMPLANT
SUT VIC AB 3-0 FS2 27 (SUTURE) IMPLANT
SUT VICRYL 4-0 PS2 18IN ABS (SUTURE) ×1 IMPLANT
SUT VICRYL RAPIDE 4/0 PS 2 (SUTURE) IMPLANT
SUTURE FIBERWR 2-0 18 17.9 3/8 (SUTURE) IMPLANT
SYR BULB 3OZ (MISCELLANEOUS) ×2 IMPLANT
SYRINGE 10CC LL (SYRINGE) ×1 IMPLANT
TOWEL OR 17X24 6PK STRL BLUE (TOWEL DISPOSABLE) ×2 IMPLANT
TUBE CONNECTING 20X1/4 (TUBING) IMPLANT
UNDERPAD 30X30 INCONTINENT (UNDERPADS AND DIAPERS) ×2 IMPLANT
WATER STERILE IRR 1000ML POUR (IV SOLUTION) ×1 IMPLANT

## 2011-10-22 NOTE — Brief Op Note (Signed)
10/22/2011  5:05 PM  PATIENT:  Mariah [name]  56 y.o. female  PRE-OPERATIVE DIAGNOSIS:  Left radial head dislocation  POST-OPERATIVE DIAGNOSIS:  Left radial head dislocation  PROCEDURE:  Procedure(s): OPEN REDUCTION INTERNAL FIXATION (ORIF) ELBOW/OLECRANON FRACTURE  SURGEON:  Surgeon(s): Marlowe Shores, MD  PHYSICIAN ASSISTANT:   ASSISTANTS:gary kuzma   ANESTHESIA:   general  EBL:  Total I/O In: 2200 [I.V.:2200] Out: -   BLOOD ADMINISTERED:none  DRAINS: none   LOCAL MEDICATIONS USED:  NONE  SPECIMEN:  No Specimen  DISPOSITION OF SPECIMEN:  N/A  COUNTS:  YES  TOURNIQUET:   Total Tourniquet Time Documented: Upper Arm (Left) - 82 minutes  DICTATION: .Other Dictation: Dictation Number [id number]  PLAN OF CARE: Discharge to home after PACU  PATIENT DISPOSITION:  PACU - hemodynamically stable.   Delay start of Pharmacological VTE agent (>24hrs) due to surgical blood loss or risk of bleeding:  {YES/NO/NOT APPLICABLE:20182

## 2011-10-22 NOTE — Anesthesia Preprocedure Evaluation (Signed)
Anesthesia Evaluation  Patient identified by MRN, date of birth, ID band Patient awake    Reviewed: Allergy & Precautions, H&P , NPO status , Patient's Chart, lab work & pertinent test results, reviewed documented beta blocker date and time   Airway Mallampati: II TM Distance: >3 FB Neck ROM: full    Dental   Pulmonary neg pulmonary ROS,          Cardiovascular hypertension, On Medications and On Home Beta Blockers + dysrhythmias     Neuro/Psych Negative Neurological ROS  Negative Psych ROS   GI/Hepatic negative GI ROS, Neg liver ROS,   Endo/Other  Morbid obesity  Renal/GU negative Renal ROS  Genitourinary negative   Musculoskeletal   Abdominal   Peds  Hematology negative hematology ROS (+)   Anesthesia Other Findings See surgeon's H&P   Reproductive/Obstetrics negative OB ROS                           Anesthesia Physical Anesthesia Plan  ASA: III  Anesthesia Plan: General   Post-op Pain Management: MAC Combined w/ Regional for Post-op pain   Induction: Intravenous  Airway Management Planned: LMA  Additional Equipment:   Intra-op Plan:   Post-operative Plan: Extubation in OR  Informed Consent: I have reviewed the patients History and Physical, chart, labs and discussed the procedure including the risks, benefits and alternatives for the proposed anesthesia with the patient or authorized representative who has indicated his/her understanding and acceptance.     Plan Discussed with: CRNA and Surgeon  Anesthesia Plan Comments:         Anesthesia Quick Evaluation

## 2011-10-22 NOTE — Anesthesia Procedure Notes (Addendum)
Anesthesia Regional Block:  Supraclavicular block  Pre-Anesthetic Checklist: ,, timeout performed, Correct Patient, Correct Site, Correct Laterality, Correct Procedure, Correct Position, site marked, Risks and benefits discussed,  Surgical consent,  Pre-op evaluation,  At surgeon's request and post-op pain management  Laterality: Left  Prep: chloraprep       Needles:   Needle Type: Other   (Arrow Echogenic)   Needle Length: 9cm  Needle Gauge: 21    Additional Needles:  Procedures: ultrasound guided Supraclavicular block Narrative:  Start time: 10/22/2011 2:25 PM End time: 10/22/2011 2:34 PM Injection made incrementally with aspirations every 5 mL.  Performed by: Personally  Anesthesiologist: C Frederick  Additional Notes: Ultrasound guidance used to: id relevant anatomy, confirm needle position, local anesthetic spread, avoidance of vascular puncture. Picture saved. No complications. Block performed personally by Janetta Hora. Gelene Mink, MD    Supraclavicular block Procedure Name: LMA Insertion Date/Time: 10/22/2011 3:20 PM Performed by: Signa Kell Pre-anesthesia Checklist: Patient identified, Emergency Drugs available, Suction available and Patient being monitored Patient Re-evaluated:Patient Re-evaluated prior to inductionOxygen Delivery Method: Circle System Utilized Preoxygenation: Pre-oxygenation with 100% oxygen Intubation Type: IV induction Ventilation: Mask ventilation without difficulty LMA: LMA inserted LMA Size: 4.0 Number of attempts: 1 Airway Equipment and Method: bite block Placement Confirmation: positive ETCO2 Tube secured with: Tape Dental Injury: Teeth and Oropharynx as per pre-operative assessment

## 2011-10-22 NOTE — Transfer of Care (Signed)
Immediate Anesthesia Transfer of Care Note  Patient: Mariah [name]  Procedure(s) Performed:  OPEN REDUCTION INTERNAL FIXATION (ORIF) ELBOW/OLECRANON FRACTURE - open reduction  possible lateral reconstruction with palmaris graft from left or right side.  Patient Location: PACU  Anesthesia Type: General and GA combined with regional for post-op pain  Level of Consciousness: awake and alert   Airway & Oxygen Therapy: Patient Spontanous Breathing and Patient connected to face mask oxygen  Post-op Assessment: Report given to PACU RN and Post -op Vital signs reviewed and stable  Post vital signs: Reviewed and stable Filed Vitals:   10/22/11 1700  BP: 192/90  Pulse:   Temp:   Resp:     Complications: No apparent anesthesia complications

## 2011-10-22 NOTE — Op Note (Signed)
See dictated note [phone]

## 2011-10-22 NOTE — Anesthesia Postprocedure Evaluation (Signed)
Anesthesia Post Note  Patient: Mariah [name]  Procedure(s) Performed:  OPEN REDUCTION INTERNAL FIXATION (ORIF) ELBOW/OLECRANON FRACTURE - open reduction  possible lateral reconstruction with palmaris graft from left or right side.  Anesthesia type: General  Patient location: PACU  Post pain: Pain level controlled  Post assessment: Patient's Cardiovascular Status Stable  Last Vitals:  Filed Vitals:   10/22/11 1730  BP: 121/68  Pulse: 96  Temp:   Resp: 13    Post vital signs: Reviewed and stable  Level of consciousness: alert  Complications: No apparent anesthesia complications

## 2011-10-22 NOTE — Progress Notes (Signed)
Assisted Dr. Frederick with left, ultrasound guided, supraclavicular block. Side rails up, monitors on throughout procedure. See vital signs in flow sheet. Tolerated Procedure well. 

## 2011-10-22 NOTE — H&P (Signed)
Mariah [name] is an 56 y.o. female.   Chief Complaint: 56 y/o female s/p left elbow radial head replacement around 3 weeks ago with diclocated radial head HPI: left elbow pain  Past Medical History  Diagnosis Date  . Hypertension   . Arthritis   . Dysrhythmia     Past Surgical History  Procedure Date  . Radial head implant 09/24/2011    Procedure: RADIAL HEAD IMPLANT;  Surgeon: Marlowe Shores, MD;  Location: [facility];  Service: Orthopedics;  Laterality: Left;  left radial head replacement    History reviewed. No pertinent family history. Social History:  reports that she quit smoking about 14 years ago. She does not have any smokeless tobacco history on file. She reports that she drinks about 1.2 ounces of alcohol per week. She reports that she does not use illicit drugs.  Allergies:  Allergies  Allergen Reactions  . Codeine     REACTION: itching  . Morphine     REACTION: itching  . Zovirax (Acyclovir Sodium) Itching    Medications Prior to Admission  Medication Dose Route Frequency Provider Last Rate Last Dose  . ceFAZolin (ANCEF) IVPB 1 g/50 mL premix  1 g Intravenous 60 min Pre-Op Marlowe Shores, MD      . chlorhexidine (HIBICLENS) 4 % liquid 4 application  60 mL Topical Once Marlowe Shores, MD      . lactated ringers infusion   Intravenous Continuous Constance Goltz, MD       Medications Prior to Admission  Medication Sig Dispense Refill  . amLODipine-atorvastatin ([name]) 10-80 MG per tablet Take 1 tablet by mouth daily. Takes 1/2 daily  30 tablet  5  . cyclobenzaprine (FLEXERIL) 10 MG tablet Take 10 mg by mouth 3 (three) times daily as needed. Takes 1/2 daily       . metoprolol succinate (TOPROL-XL) 25 MG 24 hr tablet Take 25 mg by mouth daily.        . valsartan (DIOVAN) 80 MG tablet Take 1 tablet (80 mg total) by mouth daily.  30 tablet  5  . venlafaxine (EFFEXOR) 75 MG tablet Take 75 mg by mouth 2 (two) times daily.        .  furosemide (LASIX) 20 MG tablet Take 20 mg by mouth daily. prn       . HYDROmorphone (DILAUDID) 2 MG tablet Take 2 mg by mouth every 4 (four) hours as needed.        . methylPREDNIsolone (MEDROL DOSPACK) 4 MG tablet Take 1 tablet (4 mg total) by mouth daily. follow package directions  21 tablet  0    No results found for this or any previous visit (from the past 48 hour(s)). No results found.  Review of Systems  Constitutional: Negative.   HENT: Negative.   Eyes: Negative.   Respiratory: Negative.   Cardiovascular: Negative.   Gastrointestinal: Negative.   Genitourinary: Negative.   Musculoskeletal: Negative.   Skin: Negative.   Neurological: Negative.   Endo/Heme/Allergies: Negative.   Psychiatric/Behavioral: Negative.     There were no vitals taken for this visit. Physical Exam  Constitutional: She is oriented to person, place, and time. She appears well-developed and well-nourished.  HENT:  Head: Normocephalic and atraumatic.  Eyes: Pupils are equal, round, and reactive to light.  Neck: Normal range of motion.  Cardiovascular: Normal rate.   Respiratory: Effort normal.  Musculoskeletal:       Left elbow: She exhibits swelling. tenderness found.  Radial head tenderness noted.  Neurological: She is alert and oriented to person, place, and time.  Skin: Skin is warm.  Psychiatric: She has a normal mood and affect. Her speech is normal and behavior is normal. Thought content normal.     Assessment/Plan 55y/o female with dislocated radial head  Plan open reduction and possible lateral reconstruction with tendon graft if needed  [name] A 10/22/2011, 1:18 PM

## 2011-10-24 NOTE — Op Note (Signed)
NAME:  Mariah [name], Mariah [name]                   ACCOUNT NO.:  MEDICAL RECORD NO.:  [id number]  LOCATION:                                 FACILITY:  PHYSICIAN:  Artist Pais. Mina Marble, M.D.   DATE OF BIRTH:  DATE OF PROCEDURE:  10/22/2011 DATE OF DISCHARGE:                              OPERATIVE REPORT   PREOPERATIVE DIAGNOSIS:  Dislocated radial head, left elbow, status post prosthetic replacement.  POSTOPERATIVE DIAGNOSIS:  Dislocated radial head, left elbow, status post prosthetic replacement.  PROCEDURE:  Exploration with attempted replacement of radial head with eventual excision of radial head implant.  SURGEON:  Artist Pais. Mina Marble, MD  ASSISTANT:  Dr. Merlyn Lot.  ANESTHESIA:  Axillary block and general.  COMPLICATION:  Intraoperative fracture, radial neck, fixed with FiberWire suture.  DRAINS:  No drains.   The patient was taken to the operating suite.  After induction of adequate general anesthesia, left upper extremity was prepped and draped in the usual sterile fashion.  An Esmarch was used to exsanguinate the limb.  Tourniquet was inflated to 265 mmHg.  At this point in time, using her old incision, the lateral aspect of the elbow was approached surgically.  Skin was incised 5-6 cm.  Dissection was carried down to the lateral musculature.  The old interval that was used to get down to the fracture site was opened up.  The Vicryl sutures were removed.  The radial head was dislocated posterolaterally.  We used a combination of rongeurs and osteotomes to carefully remove tissue around the implant. The implant was then removed using appropriate Align radial head set instrumentation.  Once it was removed, the wound was irrigated and debrided of all fibrous tissue.  Several attempts were made to trial different sized neck and head sizes, all of which continued to be unstable in full supination.  A rasp was used to shorten the neck 1 or 2 mm and attempts were again made to go  ahead and try and placing of the implant in using trials but these all were deemed to be unstable.  A second attempt was made to ream or rasp the neck area and intraoperative fracture occurred.  This was identified fluoroscopically and grossly and fixed with a 2-0 FiberWire suture.  Once this was done, the decision was made to not proceed with any further implant arthroplasty attempt. The wound was then thoroughly irrigated and then closed in layers of 0 Vicryl and then a 3-0 Prolene subcuticular stitch on the skin.  Steri- Strips, 4 x 4s, fluffs, and a posterior elbow splint was applied.  The patient tolerated the procedure well and returned to the recovery room in stable fashion.     Artist Pais Mina Marble, M.D.     MAW/MEDQ  D:  10/22/2011  T:  10/24/2011  Job:  [id number]

## 2011-10-25 ENCOUNTER — Encounter (HOSPITAL_BASED_OUTPATIENT_CLINIC_OR_DEPARTMENT_OTHER): Payer: Self-pay | Admitting: Orthopedic Surgery

## 2011-10-28 ENCOUNTER — Encounter (HOSPITAL_BASED_OUTPATIENT_CLINIC_OR_DEPARTMENT_OTHER): Payer: Self-pay

## 2011-11-12 ENCOUNTER — Ambulatory Visit (INDEPENDENT_AMBULATORY_CARE_PROVIDER_SITE_OTHER): Payer: PRIVATE HEALTH INSURANCE | Admitting: Physician Assistant

## 2011-11-12 ENCOUNTER — Encounter: Payer: Self-pay | Admitting: Physician Assistant

## 2011-11-12 VITALS — BP 138/84 | HR 95 | Wt 203.0 lb

## 2011-11-12 DIAGNOSIS — Z1231 Encounter for screening mammogram for malignant neoplasm of breast: Secondary | ICD-10-CM

## 2011-11-12 DIAGNOSIS — Z1239 Encounter for other screening for malignant neoplasm of breast: Secondary | ICD-10-CM

## 2011-11-12 DIAGNOSIS — Z0000 Encounter for general adult medical examination without abnormal findings: Secondary | ICD-10-CM

## 2011-11-12 MED ORDER — AMLODIPINE-ATORVASTATIN 10-80 MG PO TABS: 1.0000 | ORAL_TABLET | Freq: Every day | ORAL | Status: DC

## 2011-11-12 MED ORDER — VALSARTAN 80 MG PO TABS: 80.0000 mg | ORAL_TABLET | Freq: Every day | ORAL | Status: DC

## 2011-11-12 NOTE — Patient Instructions (Signed)
  Continue to exercise and eat healthy. Recommend 4 servings of dairy a day of 500mg  of calcium twice a day.  Thank you for enrolling in MyChart. Please follow the instructions below to securely access your online medical record. MyChart allows you to send messages to your doctor, view your test results, manage appointments, and more.   How Do I Sign Up? 1. In your Internet browser, go to [email or web address]. 2. Click on the Sign Up Now link in the Sign In box. You will see the New Member Sign Up page. 3. Enter your MyChart Access Code exactly as it appears below. You will not need to use this code after you've completed the sign-up process. If you do not sign up before the expiration date, you must request a new code. MyChart Access Code: [email or web address] Expires: 12/12/2011  5:45 PM  4. Enter your Social Security Number ([id number]) and Date of Birth (mm/dd/yyyy) as indicated and click Submit. You will be taken to the next sign-up page. 5. Create a MyChart ID. This will be your MyChart login ID and cannot be changed, so think of one that is secure and easy to remember. 6. Create a MyChart password. You can change your password at any time. 7. Enter your Password Reset Question and Answer. This can be used at a later time if you forget your password.  8. Enter your e-mail address. You will receive e-mail notification when new information is available in MyChart. 9. Click Sign Up. You can now view your medical record.   Additional Information If you have questions, you can call [phone] ([phone]) to talk to our MyChart staff. Remember, MyChart is NOT to be used for urgent needs. For medical emergencies, dial 911.

## 2011-11-12 NOTE — Progress Notes (Signed)
  Subjective:    Patient ID: Mariah [name], female    DOB: [birth year], 56 y.o.   MRN: [id number]  HPI    Review of Systems     Objective:   Physical Exam        Assessment & Plan:   Subjective:     Mariah [name] is a 56 y.o. female and is here for a comprehensive physical exam. The patient reports recently had surgery on left elbow due to fall in december. She is being followed by orthopedic. She has no other problems or concerns..  History   Social History  . Marital Status: Single    Spouse Name: N/A    Number of Children: N/A  . Years of Education: N/A   Occupational History  . Not on file.   Social History Main Topics  . Smoking status: Former Smoker    Quit date: 09/20/1997  . Smokeless tobacco: Not on file  . Alcohol Use: 1.2 oz/week    2 Glasses of wine per week     daily  . Drug Use: No  . Sexually Active: Not on file   Other Topics Concern  . Not on file   Social History Narrative  . No narrative on file   Health Maintenance  Topic Date Due  . Influenza Vaccine  07/11/2012  . Mammogram  01/04/2013  . Pap Smear  07/11/2014  . Colonoscopy  11/12/2015  . Tetanus/tdap  07/11/2021    The following portions of the patient's history were reviewed and updated as appropriate: allergies, current medications, past family history, past medical history, past social history, past surgical history and problem list.  Review of Systems Pertinent items are noted in HPI.   Objective:    BP 138/84  Pulse 95  Wt 203 lb (92.08 kg) General appearance: alert, cooperative and appears stated age Head: Normocephalic, without obvious abnormality, atraumatic Eyes: conjunctivae/corneas clear. PERRL, EOM's intact. Fundi benign. Ears: normal TM's and external ear canals both ears Nose: Nares normal. Septum midline. Mucosa normal. No drainage or sinus tenderness. Throat: lips, mucosa, and tongue normal; teeth and gums normal Neck: no adenopathy, no carotid bruit, no  JVD, supple, symmetrical, trachea midline and thyroid not enlarged, symmetric, no tenderness/mass/nodules Back: symmetric, no curvature. ROM normal. No CVA tenderness. Lungs: clear to auscultation bilaterally Heart: regular rate and rhythm, S1, S2 normal, no murmur, click, rub or gallop Abdomen: soft, non-tender; bowel sounds normal; no masses,  no organomegaly Extremities: extremities normal, atraumatic, no cyanosis or edema Pulses: 2+ and symmetric Skin: Skin color, texture, turgor normal. No rashes or lesions Lymph nodes: Cervical, supraclavicular, and axillary nodes normal. Neurologic: Alert and oriented X 3, normal strength and tone. Normal symmetric reflexes. Normal coordination and gait    Assessment:    Healthy female exam.     Plan:  Labs recently drawn in December and October 2012. Will schedule Mammogram.   See After Visit Summary for Counseling Recommendations

## 2011-11-16 ENCOUNTER — Encounter: Payer: Self-pay | Admitting: *Deleted

## 2011-12-10 ENCOUNTER — Encounter: Payer: Self-pay | Admitting: Family Medicine

## 2011-12-10 ENCOUNTER — Ambulatory Visit (INDEPENDENT_AMBULATORY_CARE_PROVIDER_SITE_OTHER): Payer: PRIVATE HEALTH INSURANCE | Admitting: Family Medicine

## 2011-12-10 DIAGNOSIS — J329 Chronic sinusitis, unspecified: Secondary | ICD-10-CM

## 2011-12-10 MED ORDER — HYDROCODONE-HOMATROPINE 5-1.5 MG/5ML PO SYRP: 5.0000 mL | ORAL_SOLUTION | Freq: Every evening | ORAL | Status: AC | PRN

## 2011-12-10 MED ORDER — AMOXICILLIN 875 MG PO TABS: 875.0000 mg | ORAL_TABLET | Freq: Two times a day (BID) | ORAL | Status: AC

## 2011-12-10 NOTE — Progress Notes (Signed)
  Subjective:    Patient ID: Mariah [name], female    DOB: 02-17-1956, 56 y.o.   MRN: [id number]  HPI sINUS CONGSETION for 5 days.  + fever.  Taking corcidan and Tylenol. Helps minimally.   + coughing.  Bilateral Facial pressure and HA. Ear popping.  Woke up feeling worse this AM.  Cough is dry.  Some nasal saline.  No GI sxs.  + nauseated first 2 days.     Review of Systems     Objective:   Physical Exam  Constitutional: She is oriented to person, place, and time. She appears well-developed and well-nourished.  HENT:  Head: Normocephalic and atraumatic.  Right Ear: External ear normal.  Left Ear: External ear normal.  Nose: Nose normal.  Mouth/Throat: Oropharynx is clear and moist.       TMs and canals are clear. Very tender over the maxillary sinues bilaterally.   Eyes: Conjunctivae and EOM are normal. Pupils are equal, round, and reactive to light.  Neck: Neck supple. No thyromegaly present.  Cardiovascular: Normal rate, regular rhythm and normal heart sounds.   Pulmonary/Chest: Effort normal and breath sounds normal. She has no wheezes.  Lymphadenopathy:    She has no cervical adenopathy.  Neurological: She is alert and oriented to person, place, and time.  Skin: Skin is warm and dry.  Psychiatric: She has a normal mood and affect.          Assessment & Plan:  Sinusitis - On the early end but feels much worse today, will go ahead and treat. Can use hydrocodone cough med and she would like to have some for bedtime. Will tx with amoxicilin x 10 days. Call if not better in one week. Symptomatic care.

## 2011-12-10 NOTE — Patient Instructions (Signed)

## 2012-01-11 ENCOUNTER — Ambulatory Visit
Admission: RE | Admit: 2012-01-11 | Discharge: 2012-01-11 | Disposition: A | Discharge status: Home / Self Care | Payer: PRIVATE HEALTH INSURANCE | Source: Ambulatory Visit | Attending: Physician Assistant | Admitting: Physician Assistant

## 2012-01-11 DIAGNOSIS — Z1231 Encounter for screening mammogram for malignant neoplasm of breast: Secondary | ICD-10-CM

## 2012-03-13 ENCOUNTER — Telehealth: Payer: Self-pay | Admitting: *Deleted

## 2012-03-13 NOTE — Telephone Encounter (Signed)
Take 1/2 tab daily 

## 2012-03-13 NOTE — Telephone Encounter (Signed)
Pharmacist calls and wants to know the right directions on this patient [name]. Has 2 instructions on file one says take one tablet daily and the other says take 1/2 tab daily. Please clarify

## 2012-03-14 NOTE — Telephone Encounter (Signed)
Notified pharmacy. KG LPN

## 2012-05-20 ENCOUNTER — Other Ambulatory Visit: Payer: Self-pay | Admitting: Physician Assistant

## 2012-05-29 ENCOUNTER — Other Ambulatory Visit: Payer: Self-pay | Admitting: *Deleted

## 2012-05-29 MED ORDER — VALSARTAN 80 MG PO TABS: 80.0000 mg | ORAL_TABLET | Freq: Every day | ORAL | Status: DC

## 2012-08-08 LAB — BASIC METABOLIC PANEL: Creatinine: 1.2 mg/dL — AB (ref 0.5–1.1)

## 2012-08-08 LAB — TSH: TSH: 1.88 u[IU]/mL (ref 0.41–5.90)

## 2012-08-08 LAB — LIPID PANEL: Cholesterol: 207 mg/dL — AB (ref 0–200)

## 2012-08-08 LAB — HEPATIC FUNCTION PANEL: AST: 22 U/L (ref 13–35)

## 2012-08-16 ENCOUNTER — Encounter: Payer: Self-pay | Admitting: *Deleted

## 2012-10-13 ENCOUNTER — Ambulatory Visit (INDEPENDENT_AMBULATORY_CARE_PROVIDER_SITE_OTHER): Payer: PRIVATE HEALTH INSURANCE | Admitting: Physician Assistant

## 2012-10-13 DIAGNOSIS — Z23 Encounter for immunization: Secondary | ICD-10-CM

## 2012-11-03 ENCOUNTER — Ambulatory Visit (INDEPENDENT_AMBULATORY_CARE_PROVIDER_SITE_OTHER): Payer: BC Managed Care – PPO | Admitting: Physician Assistant

## 2012-11-03 ENCOUNTER — Encounter: Payer: Self-pay | Admitting: Physician Assistant

## 2012-11-03 VITALS — BP 146/93 | HR 96 | Wt 203.0 lb

## 2012-11-03 DIAGNOSIS — Q613 Polycystic kidney, unspecified: Secondary | ICD-10-CM

## 2012-11-03 DIAGNOSIS — E785 Hyperlipidemia, unspecified: Secondary | ICD-10-CM

## 2012-11-03 DIAGNOSIS — Z79899 Other long term (current) drug therapy: Secondary | ICD-10-CM

## 2012-11-03 DIAGNOSIS — I10 Essential (primary) hypertension: Secondary | ICD-10-CM

## 2012-11-03 DIAGNOSIS — E781 Pure hyperglyceridemia: Secondary | ICD-10-CM

## 2012-11-03 MED ORDER — OLMESARTAN-AMLODIPINE-HCTZ 40-10-25 MG PO TABS: 1.0000 | ORAL_TABLET | Freq: Every day | ORAL | Status: DC

## 2012-11-03 MED ORDER — ATORVASTATIN CALCIUM 40 MG PO TABS: 40.0000 mg | ORAL_TABLET | Freq: Every day | ORAL | Status: DC

## 2012-11-03 NOTE — Patient Instructions (Addendum)
Samples given of Tribenzor to start daily. Stop [name] and Diovan. Add single prescription of lipitor. Fax BP measurements in 2 weeks of so. Then will decide if we need to increase metoprolol. Recheck BP in office in 2 months. Recheck cholesterol in 4-6 months.    Hypertriglyceridemia  Diet for High blood levels of Triglycerides Most fats in food are triglycerides. Triglycerides in your blood are stored as fat in your body. High levels of triglycerides in your blood may put you at a greater risk for heart disease and stroke.  Normal triglyceride levels are less than 150 mg/dL. Borderline high levels are 150-199 mg/dl. High levels are 200 - 499 mg/dL, and very high triglyceride levels are greater than 500 mg/dL. The decision to treat high triglycerides is generally based on the level. For people with borderline or high triglyceride levels, treatment includes weight loss and exercise. Drugs are recommended for people with very high triglyceride levels. Many people who need treatment for high triglyceride levels have metabolic syndrome. This syndrome is a collection of disorders that often include: insulin resistance, high blood pressure, blood clotting problems, high cholesterol and triglycerides. TESTING PROCEDURE FOR TRIGLYCERIDES  You should not eat 4 hours before getting your triglycerides measured. The normal range of triglycerides is between 10 and 250 milligrams per deciliter (mg/dl). Some people may have extreme levels (1000 or above), but your triglyceride level may be too high if it is above 150 mg/dl, depending on what other risk factors you have for heart disease.  People with high blood triglycerides may also have high blood cholesterol levels. If you have high blood cholesterol as well as high blood triglycerides, your risk for heart disease is probably greater than if you only had high triglycerides. High blood cholesterol is one of the main risk factors for heart disease. CHANGING YOUR  DIET  Your weight can affect your blood triglyceride level. If you are more than 20% above your ideal body weight, you may be able to lower your blood triglycerides by losing weight. Eating less and exercising regularly is the best way to combat this. Fat provides more calories than any other food. The best way to lose weight is to eat less fat. Only 30% of your total calories should come from fat. Less than 7% of your diet should come from saturated fat. A diet low in fat and saturated fat is the same as a diet to decrease blood cholesterol. By eating a diet lower in fat, you may lose weight, lower your blood cholesterol, and lower your blood triglyceride level.  Eating a diet low in fat, especially saturated fat, may also help you lower your blood triglyceride level. Ask your dietitian to help you figure how much fat you can eat based on the number of calories your caregiver has prescribed for you.  Exercise, in addition to helping with weight loss may also help lower triglyceride levels.   Alcohol can increase blood triglycerides. You may need to stop drinking alcoholic beverages.  Too much carbohydrate in your diet may also increase your blood triglycerides. Some complex carbohydrates are necessary in your diet. These may include bread, rice, potatoes, other starchy vegetables and cereals.  Reduce "simple" carbohydrates. These may include pure sugars, candy, honey, and jelly without losing other nutrients. If you have the kind of high blood triglycerides that is affected by the amount of carbohydrates in your diet, you will need to eat less sugar and less high-sugar foods. Your caregiver can help you with this.  Adding 2-4 grams of fish oil (EPA+ DHA) may also help lower triglycerides. Speak with your caregiver before adding any supplements to your regimen. Following the Diet  Maintain your ideal weight. Your caregivers can help you with a diet. Generally, eating less food and getting more exercise  will help you lose weight. Joining a weight control group may also help. Ask your caregivers for a good weight control group in your area.  Eat low-fat foods instead of high-fat foods. This can help you lose weight too.  These foods are lower in fat. Eat MORE of these:   Dried beans, peas, and lentils.  Egg whites.  Low-fat cottage cheese.  Fish.  Lean cuts of meat, such as round, sirloin, rump, and flank (cut extra fat off meat you fix).  Whole grain breads, cereals and pasta.  Skim and nonfat dry milk.  Low-fat yogurt.  Poultry without the skin.  Cheese made with skim or part-skim milk, such as mozzarella, parmesan, farmers', ricotta, or pot cheese. These are higher fat foods. Eat LESS of these:   Whole milk and foods made from whole milk, such as American, blue, cheddar, monterey jack, and swiss cheese  High-fat meats, such as luncheon meats, sausages, knockwurst, bratwurst, hot dogs, ribs, corned beef, ground pork, and regular ground beef.  Fried foods. Limit saturated fats in your diet. Substituting unsaturated fat for saturated fat may decrease your blood triglyceride level. You will need to read package labels to know which products contain saturated fats.  These foods are high in saturated fat. Eat LESS of these:   Fried pork skins.  Whole milk.  Skin and fat from poultry.  Palm oil.  Butter.  Shortening.  Cream cheese.  Tomasa Blase.  Margarines and baked goods made from listed oils.  Vegetable shortenings.  Chitterlings.  Fat from meats.  Coconut oil.  Palm kernel oil.  Lard.  Cream.  Sour cream.  Fatback.  Coffee whiteners and non-dairy creamers made with these oils.  Cheese made from whole milk. Use unsaturated fats (both polyunsaturated and monounsaturated) moderately. Remember, even though unsaturated fats are better than saturated fats; you still want a diet low in total fat.  These foods are high in unsaturated fat:   Canola  oil.  Sunflower oil.  Mayonnaise.  Almonds.  Peanuts.  Pine nuts.  Margarines made with these oils.  Safflower oil.  Olive oil.  Avocados.  Cashews.  Peanut butter.  Sunflower seeds.  Soybean oil.  Peanut oil.  Olives.  Pecans.  Walnuts.  Pumpkin seeds. Avoid sugar and other high-sugar foods. This will decrease carbohydrates without decreasing other nutrients. Sugar in your food goes rapidly to your blood. When there is excess sugar in your blood, your liver may use it to make more triglycerides. Sugar also contains calories without other important nutrients.  Eat LESS of these:   Sugar, brown sugar, powdered sugar, jam, jelly, preserves, honey, syrup, molasses, pies, candy, cakes, cookies, frosting, pastries, colas, soft drinks, punches, fruit drinks, and regular gelatin.  Avoid alcohol. Alcohol, even more than sugar, may increase blood triglycerides. In addition, alcohol is high in calories and low in nutrients. Ask for sparkling water, or a diet soft drink instead of an alcoholic beverage. Suggestions for planning and preparing meals   Bake, broil, grill or roast meats instead of frying.  Remove fat from meats and skin from poultry before cooking.  Add spices, herbs, lemon juice or vinegar to vegetables instead of salt, rich sauces or gravies.  Use  a non-stick skillet without fat or use no-stick sprays.  Cool and refrigerate stews and broth. Then remove the hardened fat floating on the surface before serving.  Refrigerate meat drippings and skim off fat to make low-fat gravies.  Serve more fish.  Use less butter, margarine and other high-fat spreads on bread or vegetables.  Use skim or reconstituted non-fat dry milk for cooking.  Cook with low-fat cheeses.  Substitute low-fat yogurt or cottage cheese for all or part of the sour cream in recipes for sauces, dips or congealed salads.  Use half yogurt/half mayonnaise in salad recipes.  Substitute  evaporated skim milk for cream. Evaporated skim milk or reconstituted non-fat dry milk can be whipped and substituted for whipped cream in certain recipes.  Choose fresh fruits for dessert instead of high-fat foods such as pies or cakes. Fruits are naturally low in fat. When Dining Out   Order low-fat appetizers such as fruit or vegetable juice, pasta with vegetables or tomato sauce.  Select clear, rather than cream soups.  Ask that dressings and gravies be served on the side. Then use less of them.  Order foods that are baked, broiled, poached, steamed, stir-fried, or roasted.  Ask for margarine instead of butter, and use only a small amount.  Drink sparkling water, unsweetened tea or coffee, or diet soft drinks instead of alcohol or other sweet beverages. QUESTIONS AND ANSWERS ABOUT OTHER FATS IN THE BLOOD: SATURATED FAT, TRANS FAT, AND CHOLESTEROL What is trans fat? Trans fat is a type of fat that is formed when vegetable oil is hardened through a process called hydrogenation. This process helps makes foods more solid, gives them shape, and prolongs their shelf life. Trans fats are also called hydrogenated or partially hydrogenated oils.  What do saturated fat, trans fat, and cholesterol in foods have to do with heart disease? Saturated fat, trans fat, and cholesterol in the diet all raise the level of LDL "bad" cholesterol in the blood. The higher the LDL cholesterol, the greater the risk for coronary heart disease (CHD). Saturated fat and trans fat raise LDL similarly.  What foods contain saturated fat, trans fat, and cholesterol? High amounts of saturated fat are found in animal products, such as fatty cuts of meat, chicken skin, and full-fat dairy products like butter, whole milk, cream, and cheese, and in tropical vegetable oils such as palm, palm kernel, and coconut oil. Trans fat is found in some of the same foods as saturated fat, such as vegetable shortening, some margarines  (especially hard or stick margarine), crackers, cookies, baked goods, fried foods, salad dressings, and other processed foods made with partially hydrogenated vegetable oils. Small amounts of trans fat also occur naturally in some animal products, such as milk products, beef, and lamb. Foods high in cholesterol include liver, other organ meats, egg yolks, shrimp, and full-fat dairy products. How can I use the new food label to make heart-healthy food choices? Check the Nutrition Facts panel of the food label. Choose foods lower in saturated fat, trans fat, and cholesterol. For saturated fat and cholesterol, you can also use the Percent Daily Value (%DV): 5% DV or less is low, and 20% DV or more is high. (There is no %DV for trans fat.) Use the Nutrition Facts panel to choose foods low in saturated fat and cholesterol, and if the trans fat is not listed, read the ingredients and limit products that list shortening or hydrogenated or partially hydrogenated vegetable oil, which tend to be high in  trans fat. POINTS TO REMEMBER:   Discuss your risk for heart disease with your caregivers, and take steps to reduce risk factors.  Change your diet. Choose foods that are low in saturated fat, trans fat, and cholesterol.  Add exercise to your daily routine if it is not already being done. Participate in physical activity of moderate intensity, like brisk walking, for at least 30 minutes on most, and preferably all days of the week. No time? Break the 30 minutes into three, 10-minute segments during the day.  Stop smoking. If you do smoke, contact your caregiver to discuss ways in which they can help you quit.  Do not use street drugs.  Maintain a normal weight.  Maintain a healthy blood pressure.  Keep up with your blood work for checking the fats in your blood as directed by your caregiver. Document Released: 07/15/2004 Document Revised: 03/28/2012 Document Reviewed: 02/10/2009 [facility] Patient  Information 2013 [place], [place].

## 2012-11-03 NOTE — Progress Notes (Signed)
  Subjective:    Patient ID: Mariah [name], female    DOB: 14-Apr-1956, 57 y.o.   MRN: [id number]  HPI Patient is a pleasant 57 yo female who presents to the clinic to go over medications. Insurance will not pay for Diovan any longer and needs to find more affordable options. She denies any problems with current medications. Pt's BP has not been this elevated in awhile per pt. She does not check usually outside of the office. She does have polycystic kidney disease. Her last lipid profile her LDL was great but TG were still elevated. She denies any CP, palpitations, vision changes, HA's, muscle problems. She does have some minimal swelling around her ankles. She fills great however needs meds that insurance company will pay for.   Review of Systems     Objective:   Physical Exam  Constitutional: She is oriented to person, place, and time. She appears well-developed and well-nourished.  HENT:  Head: Normocephalic and atraumatic.  Cardiovascular: Normal rate, regular rhythm and normal heart sounds.   Pulmonary/Chest: Effort normal and breath sounds normal.  Neurological: She is alert and oriented to person, place, and time.  Skin: Skin is warm and dry.  Psychiatric: She has a normal mood and affect. Her behavior is normal.          Assessment & Plan:  HTN/polycystic kidneys/hypertryglycerdiemia/hyperlipidemia-Rechecked BP and gone down to 146/93. Will check a BMP today since not checked in a while. Pt is to stop diovan and caudet and to start Tribenzor. I want to add the diuretic HCTZ since pt is not takin lasix. Gave separate prescription for lipitor daily. Continue on Metoprol as directed. Call with average BP readings in 2 weeks. If still not at goal will consider increasing metoprolol. Gave handout for TG diet. I would like for her to be able to control TG with diet and lipitor so not to add another medication. Will recheck Lipids in 4-6 months. Follow up in office 2 months with BP.

## 2012-11-04 LAB — BASIC METABOLIC PANEL
BUN: 28 mg/dL — ABNORMAL HIGH (ref 6–23)
CO2: 25 mEq/L (ref 19–32)
Chloride: 108 mEq/L (ref 96–112)
Glucose, Bld: 112 mg/dL — ABNORMAL HIGH (ref 70–99)
Potassium: 4.7 mEq/L (ref 3.5–5.3)

## 2012-11-06 ENCOUNTER — Other Ambulatory Visit: Payer: Self-pay | Admitting: Physician Assistant

## 2012-11-06 ENCOUNTER — Encounter: Payer: Self-pay | Admitting: Physician Assistant

## 2012-11-06 DIAGNOSIS — N183 Chronic kidney disease, stage 3 (moderate): Secondary | ICD-10-CM

## 2012-11-06 DIAGNOSIS — N289 Disorder of kidney and ureter, unspecified: Secondary | ICD-10-CM

## 2012-11-13 ENCOUNTER — Telehealth: Payer: Self-pay | Admitting: Physician Assistant

## 2012-11-13 MED ORDER — AMLODIPINE BESYLATE 10 MG PO TABS: 10.0000 mg | ORAL_TABLET | Freq: Every day | ORAL | Status: DC

## 2012-11-13 MED ORDER — METOPROLOL TARTRATE 25 MG PO TABS: 25.0000 mg | ORAL_TABLET | Freq: Two times a day (BID) | ORAL | Status: DC

## 2012-11-13 MED ORDER — LOSARTAN POTASSIUM 100 MG PO TABS: 100.0000 mg | ORAL_TABLET | Freq: Every day | ORAL | Status: DC

## 2012-11-13 NOTE — Telephone Encounter (Signed)
Pt.notified

## 2012-11-13 NOTE — Telephone Encounter (Signed)
Call pt: Mariah [name]. You already have lipitor prescription and will send norvasc dosage  But will increase to 10mg  daily.This is the exact same meds as [name] just separate and more norvasc. Mariah diovan and try losartan 100mg  daily. Will also send over metoprolol to start 25mg  twice a day. Recheck BP in office in 2 weeks. Continue to keep log.

## 2012-11-13 NOTE — Telephone Encounter (Signed)
Pt called and stated that her BP has continued to be elevated on new meds. Felt like BP was increasing and her body felt worse. She stopped new meds and went back on Diovan and caudet 1/2 tab. She cannot afford these meds.   Advise is below:

## 2012-11-14 ENCOUNTER — Telehealth: Payer: Self-pay | Admitting: *Deleted

## 2012-11-14 NOTE — Telephone Encounter (Signed)
Pt calls & states that the losartan is making her extremely dizzy.  Also she states that the losartan says to not take if you have kidney disease & she does.  She is still lightheaded after 3 hrs of taking the losartan.  Is there any type of order that she needs to be taking these new meds?  Could the dizziness be temporary? Please advise

## 2012-11-15 ENCOUNTER — Telehealth: Payer: Self-pay | Admitting: Physician Assistant

## 2012-11-15 NOTE — Telephone Encounter (Signed)
Called pt. See telephone note.

## 2012-11-15 NOTE — Telephone Encounter (Signed)
Called pt. She has not been dizzy today and BP has been in low 130's of low 80's. She was not taking with food and when she started taking with food dizziness resolved. Continue with current therapy. There is no renal dosing for losartan will continue to check kidney function.

## 2012-11-27 ENCOUNTER — Encounter: Payer: Self-pay | Admitting: Physician Assistant

## 2012-11-27 ENCOUNTER — Ambulatory Visit (INDEPENDENT_AMBULATORY_CARE_PROVIDER_SITE_OTHER): Payer: BC Managed Care – PPO | Admitting: Physician Assistant

## 2012-11-27 VITALS — BP 139/85 | HR 84 | Wt 206.0 lb

## 2012-11-27 DIAGNOSIS — I10 Essential (primary) hypertension: Secondary | ICD-10-CM

## 2012-11-27 MED ORDER — METOPROLOL SUCCINATE ER 25 MG PO TB24: 25.0000 mg | ORAL_TABLET | Freq: Every day | ORAL | Status: DC

## 2012-11-27 MED ORDER — VALSARTAN 160 MG PO TABS: 160.0000 mg | ORAL_TABLET | Freq: Every day | ORAL | Status: DC

## 2012-11-27 MED ORDER — AMLODIPINE-ATORVASTATIN 10-40 MG PO TABS: 1.0000 | ORAL_TABLET | Freq: Every day | ORAL | Status: DC

## 2012-11-27 NOTE — Patient Instructions (Addendum)
Fax copy of BP readings to office.

## 2012-11-28 NOTE — Progress Notes (Signed)
  Subjective:    Patient ID: Mariah [name], female    DOB: 1956-03-14, 57 y.o.   MRN: [id number]  Hypertension This is a chronic problem. The current episode started more than 1 year ago. The problem has been gradually improving since onset. The problem is uncontrolled. Associated symptoms include headaches. Pertinent negatives include no anxiety, blurred vision, chest pain, malaise/fatigue, neck pain, orthopnea, palpitations, peripheral edema, PND, shortness of breath or sweats. (Since we have made medication changes patient has felt dizzy and more frequent HA's. ) There are no associated agents to hypertension. Risk factors for coronary artery disease include dyslipidemia, family history, obesity, post-menopausal state, stress and sedentary lifestyle. Past treatments include ACE inhibitors, diuretics, lifestyle changes and angiotensin blockers. The current treatment provides moderate improvement. Compliance problems include diet, exercise and medication side effects.  Hypertensive end-organ damage includes kidney disease. There is no history of angina. Identifiable causes of hypertension include chronic renal disease.   Patient would like to go back on original meds even if they have to pay more for meds.      Review of Systems  Constitutional: Negative for malaise/fatigue.  HENT: Negative for neck pain.   Eyes: Negative for blurred vision.  Respiratory: Negative for shortness of breath.   Cardiovascular: Negative for chest pain, palpitations, orthopnea and PND.  Neurological: Positive for headaches.       Objective:   Physical Exam  Constitutional: She is oriented to person, place, and time. She appears well-developed and well-nourished.  HENT:  Head: Normocephalic and atraumatic.  Cardiovascular: Normal rate, regular rhythm and normal heart sounds.   Pulmonary/Chest: Effort normal and breath sounds normal. She has no wheezes.  Neurological: She is alert and oriented to person, place,  and time.  Skin: Skin is warm and dry.  Psychiatric: She has a normal mood and affect. Her behavior is normal.          Assessment & Plan:  HTN- Want to stay away from diuretics since has polycystic ovarian disease. Switched back to Diovan from losartan. Metoprolol Succinate from tarate. And lipitor/norvasc to [name]. Will follow up in 1 month. BP better will continue to keep log and fax in next two weeks to make sure under 140/90. Reminded of importance of diet being low salt and regular exercise.

## 2012-11-29 ENCOUNTER — Other Ambulatory Visit: Payer: Self-pay | Admitting: Physician Assistant

## 2012-12-10 IMAGING — OT DG DXA BONE DENSITY STUDY HL7
2 series · 2 of 2 positions shown · non-contrast
Comparison: None.

CLINICAL DATA: 54-year-old postmenopausal female with history of
elevated blood pressure and cholesterol.  The patient takes calcium
and vitamin D.

[Series 1: — · left · 1 of 1 slices shown (1 of 2)]
[im 1/1]
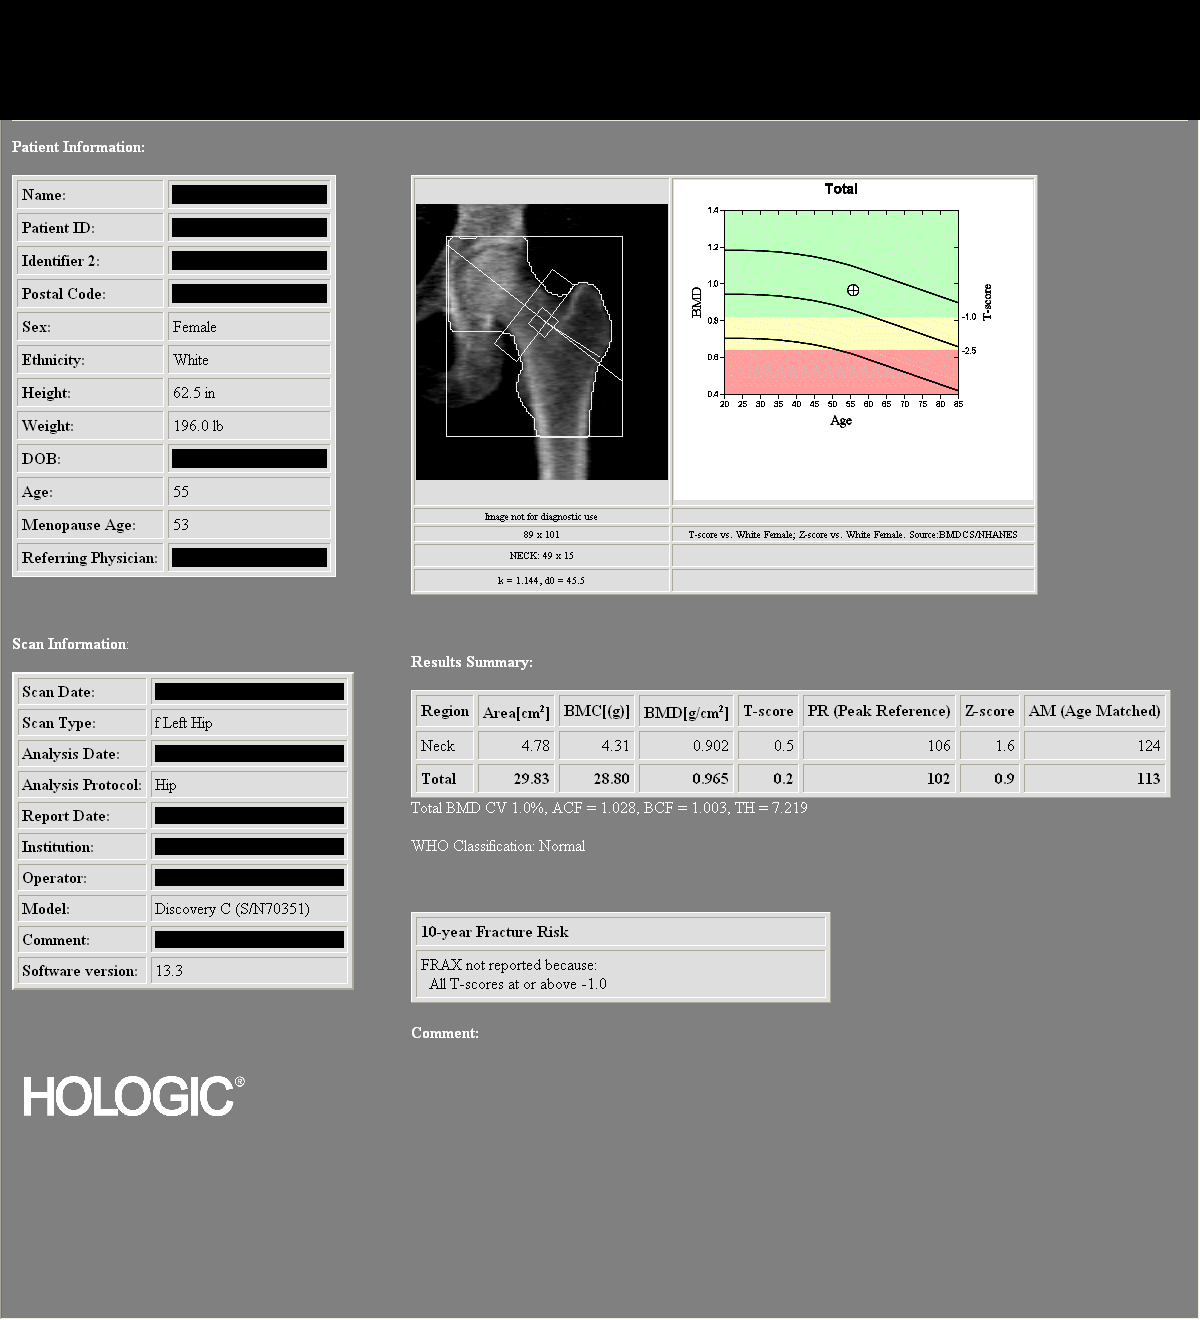

[Series 2: — · 1 of 1 slices shown (2 of 2)]
[im 1/1]
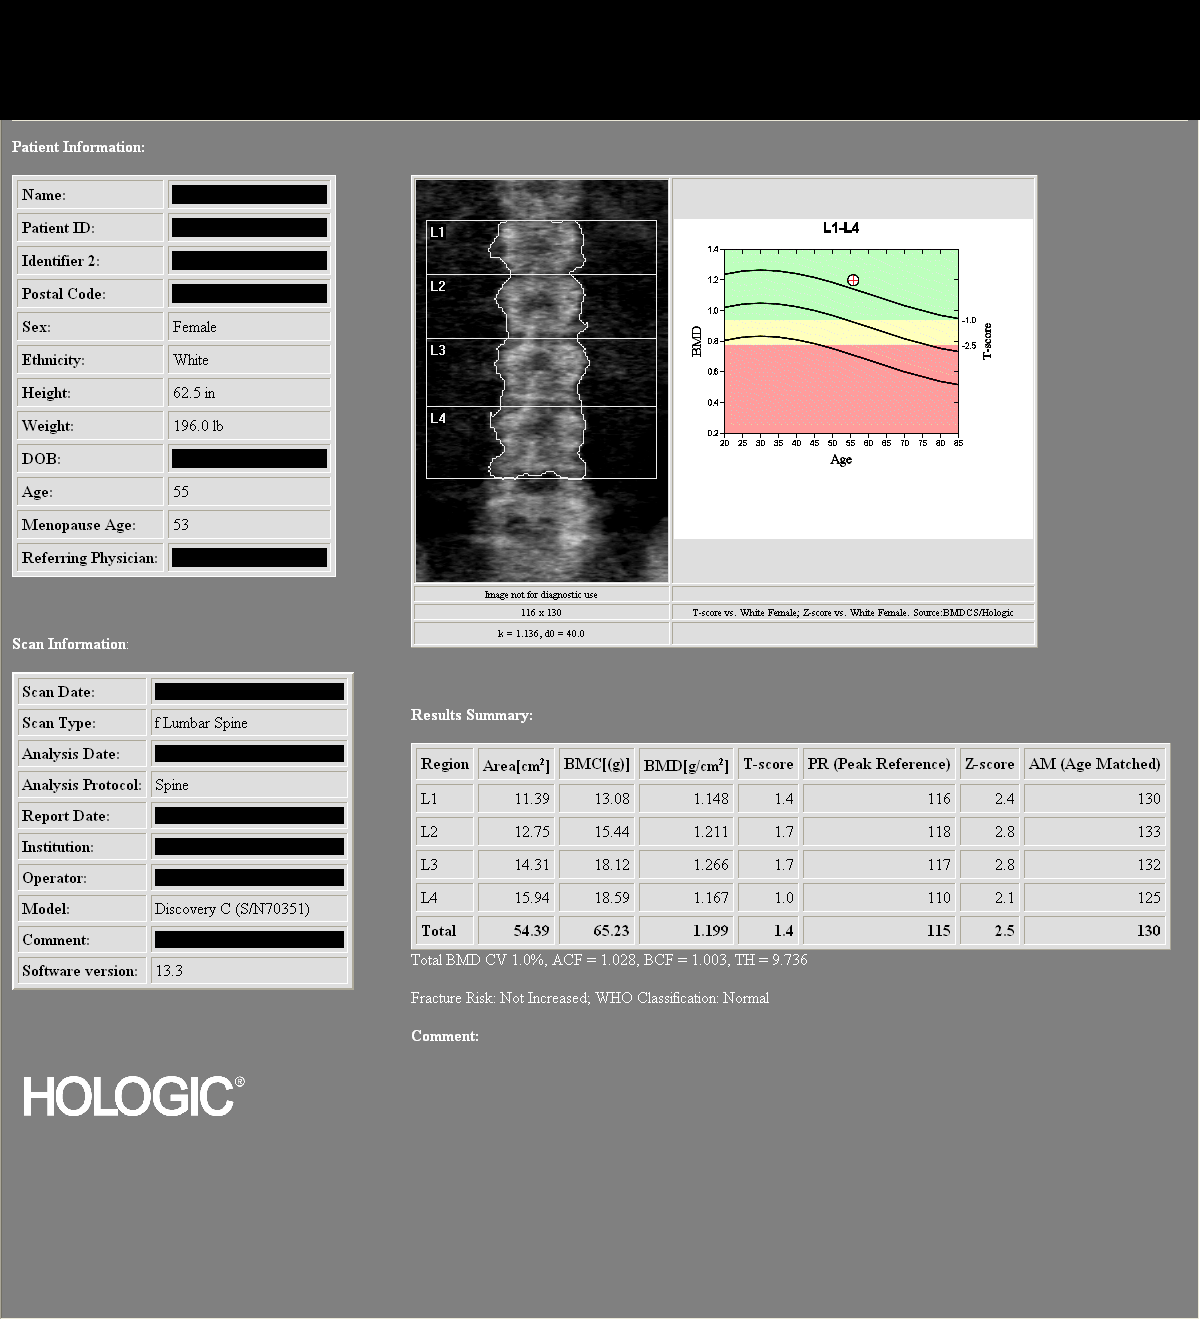

[2 of 2 positions shown; findings below may reference images not displayed]

DUAL X-RAY ABSORPTIOMETRY (DXA) FOR BONE MINERAL DENSITY

AP LUMBAR SPINE (L1 - L4)

Bone Mineral Density (BMD):            1.199 g/cm2
Young Adult T Score:
Z Score:

LEFT FEMUR (TOTAL)

Bone Mineral Density (BMD):             0.965 g/cm2
Young Adult T Score:
Z Score:

ASSESSMENT:  Patient's diagnostic category is NORMAL by WHO
Criteria.

FRACTURE RISK: NOT INCREASED

FRAX: Not applicable
RECOMMENDATIONS:

Effective therapies are available in the form of bisphosphonates,
selective estrogen receptor modulators, biologic agents, and
hormone replacement therapy (for women).  All patients should
ensure an adequate intake of dietary calcium (1200mg daily) and
vitamin D (800 Carlos Massao Priyadarshana) unless contraindicated.

All treatment decisions require clinical judgement and
consideration of individual patient factors, including patient
preferences, co-morbidities, previous drug use, risk factors not
captured in the FRAX model (e.g., frailty, falls, vitamin D
deficiency, increased bone turnover, interval significant decline
in bone density) and possible under-or over-estimation of fracture
risk by FRAX.

The National Osteoporosis Foundation recommends that FDA-approved
medical therapies be considered in postmenopausal women and mean
age 50 or older with a:

      1)     Hip or vertebral (clinical or morphometric) fracture.

2)    T-score of -2.5 or lower at the spine or hip.
3)    Ten-year fracture probability by FRAX of 3% or greater for
hip fracture or 20% or greater for major osteoporotic fracture.
FOLLOW-UP:

People with diagnosed cases of osteoporosis or at high risk for
fracture should have regular bone mineral density tests.  For
patients eligible for Medicare, routine testing is allowed once
every 2 years.  The testing frequency can be increased to one year
for patients who have rapidly progressing disease, those who are
receiving or discontinuing medical therapy to restore bone mass, or
have additional risk factors.

World Health Organization (WHO) Criteria:

Normal: T scores from +1.0 to -1.0
Low Bone Mass (Osteopenia): T scores between -1.0 and -2.5
Osteoporosis: T scores -2.5 and below

Comparison to Reference Population:

T score is the key measure used in the diagnosis of osteoporosis
and relative risk determination for fracture.  It provides a value
for bone mass relative to the mean bone mass of a young adult
reference population expressed in terms of standard deviation (SD).

Z score is the age-matched score showing the patient's values
compared to a population matched for age, sex, and race.  This is
also expressed in terms of standard deviation.  The patient may
have values that compare favorably to the age-matched values and
still be at increased risk for fracture.

## 2012-12-11 ENCOUNTER — Encounter: Payer: Self-pay | Admitting: Physician Assistant

## 2012-12-11 ENCOUNTER — Ambulatory Visit (INDEPENDENT_AMBULATORY_CARE_PROVIDER_SITE_OTHER): Payer: BC Managed Care – PPO | Admitting: Physician Assistant

## 2012-12-11 VITALS — BP 125/76 | HR 107 | Temp 98.4°F | Wt 205.0 lb

## 2012-12-11 DIAGNOSIS — J019 Acute sinusitis, unspecified: Secondary | ICD-10-CM

## 2012-12-11 DIAGNOSIS — I10 Essential (primary) hypertension: Secondary | ICD-10-CM

## 2012-12-11 MED ORDER — HYDROCODONE-HOMATROPINE 5-1.5 MG/5ML PO SYRP: 5.0000 mL | ORAL_SOLUTION | Freq: Every evening | ORAL | Status: DC | PRN

## 2012-12-11 MED ORDER — AZITHROMYCIN 250 MG PO TABS: ORAL_TABLET | ORAL | Status: DC

## 2012-12-11 NOTE — Progress Notes (Signed)
  Subjective:    Patient ID: Mariah [name], female    DOB: 03-Aug-1956, 57 y.o.   MRN: [id number]  HPI Patient is a 57 year old female who comes in with chief complaint of sinus pressure and cough. We have also been trying to control her hypertension and tachycardia. We recently changed her medications back to Diovan, Norvasc and metoprolol. She is feeling much better but still reporting high blood pressure readings and increased heart rate. BP's around 150's over high 80's and pulse 107's. Denies any chest pain, palpitations. She has been having productive cough and sinus pressure for the last 5 days. She is concerned because she has to take care of her partner after surgery in 2 days and needs to be feeling better. She has tried over-the-counter Mucinex and ibuprofen along with Delsym for cough. Her throat is sore but is still able to eat and drink. Her ear still congested but no pain associated. She has felt hot but denies any fever. Treatment given has only made mildly better.     Review of Systems     Objective:   Physical Exam  Constitutional: She is oriented to person, place, and time. She appears well-developed and well-nourished.  HENT:  Head: Normocephalic and atraumatic.  Right Ear: External ear normal.  Left Ear: External ear normal.  TM's clear.   Oropharynx red with PND.   Bilateral turbinates red and swollen.   Maxillary tenderness bilaterally.  Eyes: Conjunctivae are normal. Right eye exhibits no discharge. Left eye exhibits no discharge.  Neck: Normal range of motion. Neck supple. No thyromegaly present.  Cardiovascular: Regular rhythm and normal heart sounds.   Tachycardia 107.  Pulmonary/Chest: Effort normal and breath sounds normal. She has no wheezes.  Lymphadenopathy:    She has no cervical adenopathy.  Neurological: She is alert and oriented to person, place, and time.  Skin: Skin is warm and dry.  Flushed cheeks.   Psychiatric: She has a normal mood and  affect. Her behavior is normal.          Assessment & Plan:  Sinusitis-was given Z-Pak and cough syrup. Symptomatic care was discussed. Patient was also told to consider him to call care for cough and combination with Delsym. If not improving call office could consider steroid or 5 days.  Hypertension/tachycardia-explained patient that some of her increased heart rate could be do to recent sickness. She brings them BP's that are not running under 140/90 therefore we will increase metoprolol to 50 mg in the morning. Patient encouraged to continually check BPs and to followup in 2 months for BP recheck.

## 2012-12-11 NOTE — Patient Instructions (Addendum)
[facility] Cold [facility] 3 drops on tongue three times a day/[facility].     Sinusitis Sinusitis is redness, soreness, and swelling (inflammation) of the paranasal sinuses. Paranasal sinuses are air pockets within the bones of your face (beneath the eyes, the middle of the forehead, or above the eyes). In healthy paranasal sinuses, mucus is able to drain out, and air is able to circulate through them by way of your nose. However, when your paranasal sinuses are inflamed, mucus and air can become trapped. This can allow bacteria and other germs to grow and cause infection. Sinusitis can develop quickly and last only a short time (acute) or continue over a long period (chronic). Sinusitis that lasts for more than 12 weeks is considered chronic.  CAUSES  Causes of sinusitis include:  Allergies.  Structural abnormalities, such as displacement of the cartilage that separates your nostrils (deviated septum), which can decrease the air flow through your nose and sinuses and affect sinus drainage.  Functional abnormalities, such as when the small hairs (cilia) that line your sinuses and help remove mucus do not work properly or are not present. SYMPTOMS  Symptoms of acute and chronic sinusitis are the same. The primary symptoms are pain and pressure around the affected sinuses. Other symptoms include:  Upper toothache.  Earache.  Headache.  Bad breath.  Decreased sense of smell and taste.  A cough, which worsens when you are lying flat.  Fatigue.  Fever.  Thick drainage from your nose, which often is green and may contain pus (purulent).  Swelling and warmth over the affected sinuses. DIAGNOSIS  Your caregiver will perform a physical exam. During the exam, your caregiver may:  Look in your nose for signs of abnormal growths in your nostrils (nasal polyps).  Tap over the affected sinus to check for signs of infection.  View the inside of your sinuses (endoscopy) with a special imaging device  with a light attached (endoscope), which is inserted into your sinuses. If your caregiver suspects that you have chronic sinusitis, one or more of the following tests may be recommended:  Allergy tests.  Nasal culture A sample of mucus is taken from your nose and sent to a lab and screened for bacteria.  Nasal cytology A sample of mucus is taken from your nose and examined by your caregiver to determine if your sinusitis is related to an allergy. TREATMENT  Most cases of acute sinusitis are related to a viral infection and will resolve on their own within 10 days. Sometimes medicines are prescribed to help relieve symptoms (pain medicine, decongestants, nasal steroid sprays, or saline sprays).  However, for sinusitis related to a bacterial infection, your caregiver will prescribe antibiotic medicines. These are medicines that will help kill the bacteria causing the infection.  Rarely, sinusitis is caused by a fungal infection. In theses cases, your caregiver will prescribe antifungal medicine. For some cases of chronic sinusitis, surgery is needed. Generally, these are cases in which sinusitis recurs more than 3 times per year, despite other treatments. HOME CARE INSTRUCTIONS   Drink plenty of water. Water helps thin the mucus so your sinuses can drain more easily.  Use a humidifier.  Inhale steam 3 to 4 times a day (for example, sit in the bathroom with the shower running).  Apply a warm, moist washcloth to your face 3 to 4 times a day, or as directed by your caregiver.  Use saline nasal sprays to help moisten and clean your sinuses.  Take over-the-counter or prescription  medicines for pain, discomfort, or fever only as directed by your caregiver. SEEK IMMEDIATE MEDICAL CARE IF:  You have increasing pain or severe headaches.  You have nausea, vomiting, or drowsiness.  You have swelling around your face.  You have vision problems.  You have a stiff neck.  You have difficulty  breathing. MAKE SURE YOU:   Understand these instructions.  Will watch your condition.  Will get help right away if you are not doing well or get worse. Document Released: 09/27/2005 Document Revised: 12/20/2011 Document Reviewed: 10/12/2011 [facility] Patient Information 2013 [place], [place].

## 2012-12-17 ENCOUNTER — Other Ambulatory Visit: Payer: Self-pay | Admitting: Physician Assistant

## 2012-12-19 ENCOUNTER — Other Ambulatory Visit: Payer: Self-pay | Admitting: *Deleted

## 2012-12-19 MED ORDER — ATORVASTATIN CALCIUM 40 MG PO TABS: 40.0000 mg | ORAL_TABLET | Freq: Every day | ORAL | Status: DC

## 2012-12-19 MED ORDER — AMLODIPINE BESYLATE 5 MG PO TABS: 5.0000 mg | ORAL_TABLET | Freq: Every day | ORAL | Status: DC

## 2012-12-26 ENCOUNTER — Ambulatory Visit (INDEPENDENT_AMBULATORY_CARE_PROVIDER_SITE_OTHER): Payer: BC Managed Care – PPO | Admitting: Family Medicine

## 2012-12-26 DIAGNOSIS — Z23 Encounter for immunization: Secondary | ICD-10-CM

## 2012-12-26 NOTE — Progress Notes (Signed)
  Subjective:    Patient ID: Mariah [name], female    DOB: 01-20-1956, 57 y.o.   MRN: [id number] Pt came in today for zostavax. No complications. HPI    Review of Systems     Objective:   Physical Exam        Assessment & Plan:

## 2013-01-19 ENCOUNTER — Ambulatory Visit (INDEPENDENT_AMBULATORY_CARE_PROVIDER_SITE_OTHER): Payer: BC Managed Care – PPO | Admitting: Cardiology

## 2013-01-19 ENCOUNTER — Encounter: Payer: Self-pay | Admitting: Cardiology

## 2013-01-19 VITALS — BP 134/82 | HR 83 | Wt 202.0 lb

## 2013-01-19 DIAGNOSIS — I4891 Unspecified atrial fibrillation: Secondary | ICD-10-CM

## 2013-01-19 DIAGNOSIS — R002 Palpitations: Secondary | ICD-10-CM

## 2013-01-19 DIAGNOSIS — I10 Essential (primary) hypertension: Secondary | ICD-10-CM

## 2013-01-19 DIAGNOSIS — E785 Hyperlipidemia, unspecified: Secondary | ICD-10-CM

## 2013-01-19 MED ORDER — METOPROLOL SUCCINATE ER 100 MG PO TB24: 100.0000 mg | ORAL_TABLET | Freq: Every day | ORAL | Status: DC

## 2013-01-19 NOTE — Patient Instructions (Addendum)
Your physician recommends that you schedule a follow-up appointment in: 8 WEEKS WITH DR Jens Som  Your physician has requested that you have an echocardiogram. Echocardiography is a painless test that uses sound waves to create images of your heart. It provides your doctor with information about the size and shape of your heart and how well your heart's chambers and valves are working. This procedure takes approximately one hour. There are no restrictions for this procedure.   Your physician has recommended that you wear an event monitor. Event monitors are medical devices that record the heart's electrical activity. Doctors most often [place] these monitors to diagnose arrhythmias. Arrhythmias are problems with the speed or rhythm of the heartbeat. The monitor is a small, portable device. You can wear one while you do your normal daily activities. This is usually used to diagnose what is causing palpitations/syncope (passing out).   INCREASE METOPROLOL TO 100 MG ONCE DAILY

## 2013-01-19 NOTE — Progress Notes (Signed)
HPI: 57 year old female for evaluation of hypertension and atrial fibrillation. Laboratories in January of 2014 showed a sodium of 143 and a potassium of 4.7.  BUN and creatinine 28 and 1.24. Patient previously followed in [place]. She apparently had a monitor previously that showed short episodes of atrial fibrillation for up to 1 minute. Patient had a stress Myoview in July 2011 that was normal. Her ejection fraction was 67%. She has dyspnea with more extreme activities but not routine activities. No orthopnea or PND. Occasional mild pedal edema. No chest pain or syncope. She occasionally feels palpitations described as her heart pounding for 30 seconds to 1 minute. She also has had problems with hypertension and her systolic blood pressure typically runs 140-150.  Current Outpatient Prescriptions  Medication Sig Dispense Refill  . amLODipine (NORVASC) 5 MG tablet Take 1 tablet (5 mg total) by mouth daily.  30 tablet  3  . amLODipine-atorvastatin ([name]) 10-40 MG per tablet Take 1 tablet by mouth daily.  30 tablet  3  . amLODipine-atorvastatin ([name]) 10-80 MG per tablet TAKE ONE-HALF TABLET BY MOUTH DAILY  15 tablet  4  . atorvastatin (LIPITOR) 40 MG tablet Take 1 tablet (40 mg total) by mouth daily.  30 tablet  3  . cyclobenzaprine (FLEXERIL) 10 MG tablet Take 10 mg by mouth 3 (three) times daily as needed. Takes 1/2 daily       . metoprolol succinate (TOPROL-XL) 50 MG 24 hr tablet Take 50 mg by mouth daily. Take with or immediately following a meal.      . valsartan (DIOVAN) 160 MG tablet Take 1 tablet (160 mg total) by mouth daily.  30 tablet  3  . venlafaxine (EFFEXOR) 75 MG tablet Take 75 mg by mouth daily.        No current facility-administered medications for this visit.    Allergies  Allergen Reactions  . Codeine     REACTION: itching  . Morphine     REACTION: itching  . Zovirax (Acyclovir Sodium) Itching    Past Medical History  Diagnosis Date  . Hypertension   .  Arthritis   . Atrial fibrillation   . Hyperlipidemia   . Polycystic kidney disease   . SDH (subdural hematoma)     Past Surgical History  Procedure Laterality Date  . Radial head implant  09/24/2011    Procedure: RADIAL HEAD IMPLANT;  Surgeon: Marlowe Shores, MD;  Location: [facility];  Service: Orthopedics;  Laterality: Left;  left radial head replacement  . Orif elbow fracture  10/22/2011    Procedure: OPEN REDUCTION INTERNAL FIXATION (ORIF) ELBOW/OLECRANON FRACTURE;  Surgeon: Marlowe Shores, MD;  Location: [facility];  Service: Orthopedics;  Laterality: Left;  open reduction  possible lateral reconstruction with palmaris graft from left or right side.  . Evacuation of subdural hematoma    . Tonsillectomy      History   Social History  . Marital Status: Single    Spouse Name: N/A    Number of Children: N/A  . Years of Education: N/A   Occupational History  . Not on file.   Social History Main Topics  . Smoking status: Former Smoker    Quit date: 09/20/1997  . Smokeless tobacco: Not on file  . Alcohol Use: 1.2 oz/week    2 Glasses of wine per week     Comment: 2-3 glasses wine per day  . Drug Use: No  . Sexually Active: Not on file  Other Topics Concern  . Not on file   Social History Narrative  . No narrative on file    Family History  Problem Relation Age of Onset  . Polycystic kidney disease Father   . Heart disease Mother     Rheumatic fever    ROS: no fevers or chills, productive cough, hemoptysis, dysphasia, odynophagia, melena, hematochezia, dysuria, hematuria, rash, seizure activity, orthopnea, PND, pedal edema, claudication. Remaining systems are negative.  Physical Exam:   Blood pressure 134/82, pulse 83, weight 202 lb (91.627 kg).  General:  Well developed/obese in NAD Skin warm/dry Patient not depressed No peripheral clubbing Back-normal HEENT-normal/normal eyelids Neck supple/normal carotid upstroke  bilaterally; no bruits; no JVD; no thyromegaly chest - CTA/ normal expansion CV - RRR/normal S1 and S2; no murmurs, rubs or gallops;  PMI nondisplaced Abdomen -NT/ND, no HSM, no mass, + bowel sounds, no bruit 2+ femoral pulses, no bruits Ext-no edema, chords, 2+ DP Neuro-grossly nonfocal  ECG sinus rhythm at a rate of 83. No ST changes.

## 2013-01-19 NOTE — Assessment & Plan Note (Signed)
Continue statin. Managed by primary care. 

## 2013-01-19 NOTE — Assessment & Plan Note (Signed)
Patient may be having bursts of atrial fibrillation. Plan CardioNet to further assess.

## 2013-01-19 NOTE — Assessment & Plan Note (Signed)
Patient has a history of paroxysmal atrial fibrillation based on outside records. She has a CHADS score of 1. I will add aspirin 81 mg daily. I will schedule an echocardiogram to assess LV function. Previous TSH normal.

## 2013-01-19 NOTE — Assessment & Plan Note (Addendum)
Blood pressure is elevated. Increase Toprol to 100 mg daily and follow. Patient instructed on lifestyle modification to improve blood pressure including low-sodium diet and decreasing alcohol intake.

## 2013-02-02 ENCOUNTER — Ambulatory Visit (HOSPITAL_COMMUNITY): Payer: BC Managed Care – PPO | Attending: Cardiology | Admitting: Radiology

## 2013-02-02 ENCOUNTER — Ambulatory Visit (INDEPENDENT_AMBULATORY_CARE_PROVIDER_SITE_OTHER): Payer: BC Managed Care – PPO

## 2013-02-02 DIAGNOSIS — Z87891 Personal history of nicotine dependence: Secondary | ICD-10-CM | POA: Insufficient documentation

## 2013-02-02 DIAGNOSIS — I4891 Unspecified atrial fibrillation: Secondary | ICD-10-CM

## 2013-02-02 DIAGNOSIS — I10 Essential (primary) hypertension: Secondary | ICD-10-CM | POA: Insufficient documentation

## 2013-02-02 DIAGNOSIS — R002 Palpitations: Secondary | ICD-10-CM | POA: Insufficient documentation

## 2013-02-02 DIAGNOSIS — R0989 Other specified symptoms and signs involving the circulatory and respiratory systems: Secondary | ICD-10-CM | POA: Insufficient documentation

## 2013-02-02 DIAGNOSIS — E785 Hyperlipidemia, unspecified: Secondary | ICD-10-CM | POA: Insufficient documentation

## 2013-02-02 DIAGNOSIS — R0609 Other forms of dyspnea: Secondary | ICD-10-CM | POA: Insufficient documentation

## 2013-02-02 NOTE — Progress Notes (Signed)
Echocardiogram performed.  

## 2013-02-02 NOTE — Progress Notes (Signed)
Placed a 30 day event monitor on patient 

## 2013-02-26 ENCOUNTER — Other Ambulatory Visit: Payer: Self-pay | Admitting: *Deleted

## 2013-02-26 MED ORDER — AMLODIPINE-ATORVASTATIN 10-40 MG PO TABS: 1.0000 | ORAL_TABLET | Freq: Every day | ORAL | Status: DC

## 2013-03-07 ENCOUNTER — Ambulatory Visit (INDEPENDENT_AMBULATORY_CARE_PROVIDER_SITE_OTHER): Payer: BC Managed Care – PPO | Admitting: Physician Assistant

## 2013-03-07 ENCOUNTER — Encounter: Payer: Self-pay | Admitting: Physician Assistant

## 2013-03-07 VITALS — BP 121/74 | HR 75 | Wt 204.0 lb

## 2013-03-07 DIAGNOSIS — I10 Essential (primary) hypertension: Secondary | ICD-10-CM

## 2013-03-07 DIAGNOSIS — I4891 Unspecified atrial fibrillation: Secondary | ICD-10-CM

## 2013-03-07 DIAGNOSIS — E785 Hyperlipidemia, unspecified: Secondary | ICD-10-CM

## 2013-03-07 MED ORDER — VALSARTAN 160 MG PO TABS: 160.0000 mg | ORAL_TABLET | Freq: Every day | ORAL | Status: DC

## 2013-03-07 MED ORDER — METOPROLOL SUCCINATE ER 100 MG PO TB24: 100.0000 mg | ORAL_TABLET | Freq: Every day | ORAL | Status: DC

## 2013-03-07 MED ORDER — AMLODIPINE-ATORVASTATIN 10-80 MG PO TABS: 1.0000 | ORAL_TABLET | Freq: Every day | ORAL | Status: DC

## 2013-03-07 NOTE — Progress Notes (Signed)
  Subjective:    Patient ID: Mariah [name], female    DOB: 1956-05-08, 57 y.o.   MRN: [id number]  HPI Patient presents to the clinic to follow up on blood pressure. Taking medication regularly. Denies any CP, palpitations, SOB. She does have occasional dizziness. Checks her BP multiple times a day. Mostly controlled a few times elevated. Works outside in [employer]. Followed by cardiologist and echo was great.    Review of Systems     Objective:   Physical Exam  Constitutional: She is oriented to person, place, and time. She appears well-developed and well-nourished.  HENT:  Head: Normocephalic and atraumatic.  Eyes: Conjunctivae are normal.  Neck: Normal range of motion. Neck supple.  Cardiovascular: Normal rate, regular rhythm and normal heart sounds.   Pulmonary/Chest: Effort normal and breath sounds normal.  Neurological: She is alert and oriented to person, place, and time.  Skin: Skin is warm and dry.  Psychiatric: She has a normal mood and affect. Her behavior is normal.          Assessment & Plan:  HTN/Afib/palpitations- Refilled medications. HR seems controlled and BP looks great. Discussed proper hydration since works outside. Follow up in 6 months for blood work and CPE.

## 2013-03-09 ENCOUNTER — Telehealth: Payer: Self-pay | Admitting: *Deleted

## 2013-03-09 NOTE — Telephone Encounter (Signed)
Left message for pt, monitor reviewed by dr Jens Som shows sinus rhythm.

## 2013-03-28 ENCOUNTER — Encounter: Payer: Self-pay | Admitting: Cardiology

## 2013-03-28 ENCOUNTER — Ambulatory Visit (INDEPENDENT_AMBULATORY_CARE_PROVIDER_SITE_OTHER): Payer: BC Managed Care – PPO | Admitting: Cardiology

## 2013-03-28 VITALS — BP 132/82 | HR 74 | Wt 204.0 lb

## 2013-03-28 DIAGNOSIS — R002 Palpitations: Secondary | ICD-10-CM

## 2013-03-28 DIAGNOSIS — I10 Essential (primary) hypertension: Secondary | ICD-10-CM

## 2013-03-28 DIAGNOSIS — E785 Hyperlipidemia, unspecified: Secondary | ICD-10-CM

## 2013-03-28 DIAGNOSIS — I4891 Unspecified atrial fibrillation: Secondary | ICD-10-CM

## 2013-03-28 MED ORDER — METOPROLOL SUCCINATE ER 100 MG PO TB24: ORAL_TABLET | ORAL | Status: DC

## 2013-03-28 NOTE — Patient Instructions (Addendum)
Your physician wants you to follow-up in: ONE YEAR WITH DR Shelda Pal will receive a reminder letter in the mail two months in advance. If you don't receive a letter, please call our office to schedule the follow-up appointment.   INCREASE METOPROLOL TO 150 MG ONCE DAILY

## 2013-03-28 NOTE — Assessment & Plan Note (Signed)
Patient remains in sinus rhythm on examination. Continue aspirin. Continue Toprol. LV function normal.

## 2013-03-28 NOTE — Assessment & Plan Note (Signed)
Management per primary care. 

## 2013-03-28 NOTE — Assessment & Plan Note (Signed)
Blood pressure elevated based on checks at home. Increase Toprol to 150 mg daily.

## 2013-03-28 NOTE — Progress Notes (Signed)
HPI: Pleasant female for fu of hypertension and atrial fibrillation. Laboratories in January of 2014 showed a sodium of 143 and a potassium of 4.7. BUN and creatinine 28 and 1.24. Patient previously followed in [place]. She apparently had a monitor previously that showed short episodes of atrial fibrillation for up to 1 minute. Patient had a stress Myoview in July 2011 that was normal. Her ejection fraction was 67%. Echocardiogram in April of 2014 showed normal LV function, grade 1 diastolic dysfunction, mild left ventricular hypertrophy and mild left atrial enlargement. Cardiac monitor repeated in April of 2014 because of palpitations. This revealed sinus rhythm. Since she was last seen, the patient has dyspnea with more extreme activities but not with routine activities. It is relieved with rest. It is not associated with chest pain. There is no orthopnea, PND or pedal edema. There is no syncope or palpitations. There is no exertional chest pain.    Current Outpatient Prescriptions  Medication Sig Dispense Refill  . amLODipine-atorvastatin ([name]) 10-80 MG per tablet Take 1 tablet by mouth daily.  30 tablet  4  . cyclobenzaprine (FLEXERIL) 10 MG tablet Take 10 mg by mouth 3 (three) times daily as needed. Takes 1/2 daily       . metoprolol succinate (TOPROL-XL) 100 MG 24 hr tablet Take 1 tablet (100 mg total) by mouth daily. Take with or immediately following a meal.  30 tablet  12  . valsartan (DIOVAN) 160 MG tablet Take 1 tablet (160 mg total) by mouth daily.  30 tablet  6  . venlafaxine (EFFEXOR) 75 MG tablet Take 75 mg by mouth daily.        No current facility-administered medications for this visit.     Past Medical History  Diagnosis Date  . Hypertension   . Arthritis   . Atrial fibrillation   . Hyperlipidemia   . Polycystic kidney disease   . SDH (subdural hematoma)     Past Surgical History  Procedure Laterality Date  . Radial head implant  09/24/2011    Procedure:  RADIAL HEAD IMPLANT;  Surgeon: Marlowe Shores, MD;  Location: [facility];  Service: Orthopedics;  Laterality: Left;  left radial head replacement  . Orif elbow fracture  10/22/2011    Procedure: OPEN REDUCTION INTERNAL FIXATION (ORIF) ELBOW/OLECRANON FRACTURE;  Surgeon: Marlowe Shores, MD;  Location: [facility];  Service: Orthopedics;  Laterality: Left;  open reduction  possible lateral reconstruction with palmaris graft from left or right side.  . Evacuation of subdural hematoma    . Tonsillectomy      History   Social History  . Marital Status: Single    Spouse Name: N/A    Number of Children: N/A  . Years of Education: N/A   Occupational History  . Not on file.   Social History Main Topics  . Smoking status: Former Smoker    Quit date: 09/20/1997  . Smokeless tobacco: Not on file  . Alcohol Use: 1.2 oz/week    2 Glasses of wine per week     Comment: 2-3 glasses wine per day  . Drug Use: No  . Sexually Active: Not on file   Other Topics Concern  . Not on file   Social History Narrative  . No narrative on file    ROS: no fevers or chills, productive cough, hemoptysis, dysphasia, odynophagia, melena, hematochezia, dysuria, hematuria, rash, seizure activity, orthopnea, PND, pedal edema, claudication. Remaining systems are negative.  Physical Exam: Well-developed  obese in no acute distress.  Skin is warm and dry.  HEENT is normal.  Neck is supple.  Chest is clear to auscultation with normal expansion.  Cardiovascular exam is regular rate and rhythm.  Abdominal exam nontender or distended. No masses palpated. Extremities show no edema. neuro grossly intact

## 2013-03-28 NOTE — Assessment & Plan Note (Signed)
Improved. CardioNet showed no significant arrhythmia. Continue beta blocker.

## 2013-04-14 ENCOUNTER — Emergency Department (INDEPENDENT_AMBULATORY_CARE_PROVIDER_SITE_OTHER): Payer: BC Managed Care – PPO

## 2013-04-14 ENCOUNTER — Emergency Department
Admission: EM | Admit: 2013-04-14 | Discharge: 2013-04-14 | Disposition: A | Discharge status: Home / Self Care | Payer: BC Managed Care – PPO | Source: Home / Self Care | Attending: Family Medicine | Admitting: Family Medicine

## 2013-04-14 DIAGNOSIS — M7989 Other specified soft tissue disorders: Secondary | ICD-10-CM

## 2013-04-14 DIAGNOSIS — L02519 Cutaneous abscess of unspecified hand: Secondary | ICD-10-CM

## 2013-04-14 DIAGNOSIS — L03113 Cellulitis of right upper limb: Secondary | ICD-10-CM

## 2013-04-14 DIAGNOSIS — S61409A Unspecified open wound of unspecified hand, initial encounter: Secondary | ICD-10-CM

## 2013-04-14 DIAGNOSIS — X58XXXA Exposure to other specified factors, initial encounter: Secondary | ICD-10-CM

## 2013-04-14 MED ORDER — DOXYCYCLINE HYCLATE 100 MG PO CAPS: 100.0000 mg | ORAL_CAPSULE | Freq: Two times a day (BID) | ORAL | Status: DC

## 2013-04-14 MED ORDER — CEFTRIAXONE SODIUM 1 G IJ SOLR
1.0000 g | Freq: Once | INTRAMUSCULAR | Status: AC
  Administered 2013-04-14: 1 g via INTRAMUSCULAR

## 2013-04-14 MED ORDER — CEPHALEXIN 500 MG PO CAPS: 500.0000 mg | ORAL_CAPSULE | Freq: Four times a day (QID) | ORAL | Status: DC

## 2013-04-14 MED ORDER — FLUCONAZOLE 150 MG PO TABS: 150.0000 mg | ORAL_TABLET | Freq: Once | ORAL | Status: DC

## 2013-04-14 NOTE — ED Provider Notes (Addendum)
History    CSN: [id number] Arrival date & time 04/14/13  1227  First MD Initiated Contact with Patient 04/14/13 1315     Chief Complaint  Patient presents with  . Puncture Wound    x 4 days ago      HPI Comments: Patient works as a Administrator.  Four days ago a sharp cut branch punctured the dorsum of her right hand.  She washed the wound, and has been applying a daily dressing.  Her last Tdap was last year.   She reports that the dorsum of her hand has become increasingly swollen, tender, warm, and painful.  No fevers, chills, and sweats   Patient is a 57 y.o. female presenting with hand injury. The history is provided by the patient.  Hand Injury Location:  Hand Time since incident:  4 days Injury: yes   Mechanism of injury comment:  Puncture Hand location:  R hand Pain details:    Quality:  Aching   Radiates to:  R arm   Severity:  Moderate   Onset quality:  Gradual   Timing:  Constant   Progression:  Worsening Chronicity:  New Dislocation: no   Foreign body present:  No foreign bodies Tetanus status:  Up to date Prior injury to area:  No Relieved by:  Nothing Exacerbated by: movement of hand. Ineffective treatments: antibiotic ointment. Associated symptoms: decreased range of motion and swelling   Associated symptoms: no fatigue, no fever and no tingling    Past Medical History  Diagnosis Date  . Hypertension   . Arthritis   . Atrial fibrillation   . Hyperlipidemia   . Polycystic kidney disease   . SDH (subdural hematoma)    Past Surgical History  Procedure Laterality Date  . Radial head implant  09/24/2011    Procedure: RADIAL HEAD IMPLANT;  Surgeon: Marlowe Shores, MD;  Location: [facility];  Service: Orthopedics;  Laterality: Left;  left radial head replacement  . Orif elbow fracture  10/22/2011    Procedure: OPEN REDUCTION INTERNAL FIXATION (ORIF) ELBOW/OLECRANON FRACTURE;  Surgeon: Marlowe Shores, MD;  Location:  [facility];  Service: Orthopedics;  Laterality: Left;  open reduction  possible lateral reconstruction with palmaris graft from left or right side.  . Evacuation of subdural hematoma    . Tonsillectomy     Family History  Problem Relation Age of Onset  . Polycystic kidney disease Father   . Heart disease Mother     Rheumatic fever   History  Substance Use Topics  . Smoking status: Former Smoker    Quit date: 09/20/1997  . Smokeless tobacco: Not on file  . Alcohol Use: 1.2 oz/week    2 Glasses of wine per week     Comment: 2-3 glasses wine per day   OB History   Grav Para Term Preterm Abortions TAB SAB Ect Mult Living                 Review of Systems  Constitutional: Negative for fever and fatigue.  All other systems reviewed and are negative.    Allergies  Codeine; Morphine; and Zovirax  Home Medications   Current Outpatient Rx  Name  Route  Sig  Dispense  Refill  . amLODipine-atorvastatin ([name]) 10-80 MG per tablet   Oral   Take 1 tablet by mouth daily.   30 tablet   4   . metoprolol succinate (TOPROL-XL) 100 MG 24 hr tablet  TAKE ONE AND ONE HALF TABLETS ONCE DAILY Take with or immediately following a meal.   30 tablet   12   . valsartan (DIOVAN) 160 MG tablet   Oral   Take 1 tablet (160 mg total) by mouth daily.   30 tablet   6   . venlafaxine (EFFEXOR) 75 MG tablet   Oral   Take 75 mg by mouth daily.          . cephALEXin (KEFLEX) 500 MG capsule   Oral   Take 1 capsule (500 mg total) by mouth 4 (four) times daily.   28 capsule   0   . cyclobenzaprine (FLEXERIL) 10 MG tablet   Oral   Take 10 mg by mouth 3 (three) times daily as needed. Takes 1/2 daily          . doxycycline (VIBRAMYCIN) 100 MG capsule   Oral   Take 1 capsule (100 mg total) by mouth 2 (two) times daily.   14 capsule   0   . fluconazole (DIFLUCAN) 150 MG tablet   Oral   Take 1 tablet (150 mg total) by mouth once.   1 tablet   1    BP 123/78  Pulse 74   Temp(Src) 98 F (36.7 C) (Oral)  Ht 5' 2.5" (1.588 m)  Wt 205 lb (92.987 kg)  BMI 36.87 kg/m2  SpO2 98% Physical Exam  Nursing note and vitals reviewed. Constitutional: She is oriented to person, place, and time. She appears well-developed and well-nourished. No distress.  Patient is obese (BMI 36.9)  HENT:  Head: Normocephalic.  Eyes: Conjunctivae are normal. Pupils are equal, round, and reactive to light.  Cardiovascular: Normal heart sounds.   Pulmonary/Chest: Breath sounds normal.  Musculoskeletal: She exhibits edema and tenderness.       Right hand: She exhibits decreased range of motion, tenderness, laceration and swelling. She exhibits no bony tenderness and normal capillary refill. Normal sensation noted.       Hands: Dorsum of right hand is swollen, tender, warm, and erythematous.  Distal neurovascular function is intact.  There is a 1.5cm dia weeping abrasion on center of dorsum as noted on diagram.  Distal neurovascular function is intact.   Lymphadenopathy:    She has no cervical adenopathy.  Neurological: She is alert and oriented to person, place, and time.  Skin: Skin is warm and dry. There is erythema.    ED Course  Procedures  None   Labs Reviewed  WOUND CULTURE pending   Dg Hand Complete Right  04/14/2013   *RADIOLOGY REPORT*  Clinical Data: Puncture wound to dorsum of right hand  RIGHT HAND - COMPLETE 3+ VIEW  Comparison: None.  Findings: No fracture or dislocation is seen.  Mild degenerative changes of the third MCP joint.  Mild dorsal soft tissue swelling.  No radiopaque foreign body is seen.  IMPRESSION: Mild dorsal soft tissue swelling.  No fracture, dislocation, or radiopaque foreign body is seen.   Original Report Authenticated By: Charline Bills, M.D.   1. Cellulitis of right hand     MDM  Rocephin 1gm IM.  Wound culture pending (Unable to obtain specimen for CBC) Begin Keflex.  Begin doxycycline for MRSA coverage. Rx for Diflucan if candida  vaginitis develops Elevate hand/arm.  Keep wound bandaged and change daily.  Apply heating pad 3 to 4 times daily. Followup with Family Doctor if not improved in 2 days. If symptoms become significantly worse during the night or over the weekend,  proceed to the local emergency room.   Lattie Haw, MD 04/19/13 1006  Addendum:  CBC attempted; unable to obtain specimen  Lattie Haw, MD 04/19/13 1008

## 2013-04-14 NOTE — ED Notes (Addendum)
Mariah [name] punctured her right hand on a tree branch while doing lawn work. The area is now red and swollen. The pain is a ,throbbing, 7/10 and worse if she holds it down. There is some drainage.

## 2013-04-16 LAB — WOUND CULTURE
Gram Stain: NONE SEEN
Gram Stain: NONE SEEN
Organism ID, Bacteria: NO GROWTH

## 2013-04-18 ENCOUNTER — Ambulatory Visit (INDEPENDENT_AMBULATORY_CARE_PROVIDER_SITE_OTHER): Payer: BC Managed Care – PPO | Admitting: Physician Assistant

## 2013-04-18 ENCOUNTER — Encounter: Payer: Self-pay | Admitting: Physician Assistant

## 2013-04-18 VITALS — BP 135/80 | HR 73 | Wt 205.0 lb

## 2013-04-18 DIAGNOSIS — L0390 Cellulitis, unspecified: Secondary | ICD-10-CM

## 2013-04-18 DIAGNOSIS — L0291 Cutaneous abscess, unspecified: Secondary | ICD-10-CM

## 2013-04-18 DIAGNOSIS — T148XXA Other injury of unspecified body region, initial encounter: Secondary | ICD-10-CM

## 2013-04-18 NOTE — Progress Notes (Signed)
  Subjective:    Patient ID: Mariah [name], female    DOB: 06-05-1956, 57 y.o.   MRN: [id number]  HPI Patient presents to the clinic to follow up on cellulitis and puncture wound of right hand. She was seen in urgent care on 04/14/13. Xrays were normal. Given doxycyline and keflex which she is still on. Wound continues to improve. Pain present but improving.    Review of Systems     Objective:   Physical Exam  Constitutional: She appears well-developed and well-nourished.  Skin:  Puncture wound to dorsum of right hand. Scab forming. No drainage. Still area of redness around wound and minimal swelling as well as warm to touch.  Psychiatric: She has a normal mood and affect. Her behavior is normal.          Assessment & Plan:  Cellulitis/puncture wound- Reassured pt that wound was healing properly. 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 Keep covered and keep putting bactroban on it until fully healed. Ice instead of heat at this time. Tylenol or ibuprofen for pain. Call if worsening after stopping abx.

## 2013-04-18 NOTE — Patient Instructions (Addendum)
Keep it covered. Continue antibiotics.

## 2013-04-20 ENCOUNTER — Telehealth: Payer: Self-pay | Admitting: Cardiology

## 2013-04-30 ENCOUNTER — Telehealth: Payer: Self-pay | Admitting: Cardiology

## 2013-04-30 DIAGNOSIS — I4891 Unspecified atrial fibrillation: Secondary | ICD-10-CM

## 2013-04-30 DIAGNOSIS — R002 Palpitations: Secondary | ICD-10-CM

## 2013-04-30 DIAGNOSIS — I10 Essential (primary) hypertension: Secondary | ICD-10-CM

## 2013-04-30 DIAGNOSIS — E785 Hyperlipidemia, unspecified: Secondary | ICD-10-CM

## 2013-04-30 MED ORDER — METOPROLOL SUCCINATE ER 50 MG PO TB24: 50.0000 mg | ORAL_TABLET | Freq: Every day | ORAL | Status: DC

## 2013-04-30 NOTE — Telephone Encounter (Signed)
New problem  Pt calling regarding a new prescription

## 2013-04-30 NOTE — Telephone Encounter (Signed)
Spoke with pt, she is taking toprol 150 mg once daily. She needs 50 mg tablets to go with the 100 mg tablets. Script sent to pharm.

## 2013-06-01 ENCOUNTER — Encounter: Payer: Self-pay | Admitting: Physician Assistant

## 2013-06-01 ENCOUNTER — Ambulatory Visit (INDEPENDENT_AMBULATORY_CARE_PROVIDER_SITE_OTHER): Payer: BC Managed Care – PPO | Admitting: Physician Assistant

## 2013-06-01 VITALS — BP 114/67 | HR 90 | Wt 206.0 lb

## 2013-06-01 DIAGNOSIS — M109 Gout, unspecified: Secondary | ICD-10-CM

## 2013-06-01 DIAGNOSIS — E785 Hyperlipidemia, unspecified: Secondary | ICD-10-CM

## 2013-06-01 DIAGNOSIS — Z131 Encounter for screening for diabetes mellitus: Secondary | ICD-10-CM

## 2013-06-01 DIAGNOSIS — N183 Chronic kidney disease, stage 3 unspecified: Secondary | ICD-10-CM

## 2013-06-01 NOTE — Patient Instructions (Addendum)
Gout  Gout is an inflammatory condition (arthritis) caused by a buildup of uric acid crystals in the joints. Uric acid is a chemical that is normally present in the blood. Under some circumstances, uric acid can form into crystals in your joints. This causes joint redness, soreness, and swelling (inflammation). Repeat attacks are common. Over time, uric acid crystals can form into masses (tophi) near a joint, causing disfigurement. Gout is treatable and often preventable.  CAUSES   The disease begins with elevated levels of uric acid in the blood. Uric acid is produced by your body when it breaks down a naturally found substance called purines. This also happens when you eat certain foods such as meats and fish. Causes of an elevated uric acid level include:   Being passed down from parent to child (heredity).   Diseases that cause increased uric acid production (obesity, psoriasis, some cancers).   Excessive alcohol use.   Diet, especially diets rich in meat and seafood.   Medicines, including certain cancer-fighting drugs (chemotherapy), diuretics, and aspirin.   Chronic kidney disease. The kidneys are no longer able to remove uric acid well.   Problems with metabolism.  Conditions strongly associated with gout include:   Obesity.   High blood pressure.   High cholesterol.   Diabetes.  Not everyone with elevated uric acid levels gets gout. It is not understood why some people get gout and others do not. Surgery, joint injury, and eating too much of certain foods are some of the factors that can lead to gout.  SYMPTOMS    An attack of gout comes on quickly. It causes intense pain with redness, swelling, and warmth in a joint.   Fever can occur.   Often, only one joint is involved. Certain joints are more commonly involved:   Base of the big toe.   Knee.   Ankle.   Wrist.   Finger.  Without treatment, an attack usually goes away in a few days to weeks. Between attacks, you usually will not have  symptoms, which is different from many other forms of arthritis.  DIAGNOSIS   Your caregiver will suspect gout based on your symptoms and exam. Removal of fluid from the joint (arthrocentesis) is done to check for uric acid crystals. Your caregiver will give you a medicine that numbs the area (local anesthetic) and use a needle to remove joint fluid for exam. Gout is confirmed when uric acid crystals are seen in joint fluid, using a special microscope. Sometimes, blood, urine, and X-ray tests are also used.  TREATMENT   There are 2 phases to gout treatment: treating the sudden onset (acute) attack and preventing attacks (prophylaxis).  Treatment of an Acute Attack   Medicines are used. These include anti-inflammatory medicines or steroid medicines.   An injection of steroid medicine into the affected joint is sometimes necessary.   The painful joint is rested. Movement can worsen the arthritis.   You may use warm or cold treatments on painful joints, depending which works best for you.   Discuss the use of coffee, vitamin C, or cherries with your caregiver. These may be helpful treatment options.  Treatment to Prevent Attacks  After the acute attack subsides, your caregiver may advise prophylactic medicine. These medicines either help your kidneys eliminate uric acid from your body or decrease your uric acid production. You may need to stay on these medicines for a very long time.  The early phase of treatment with prophylactic medicine can be associated   with an increase in acute gout attacks. For this reason, during the first few months of treatment, your caregiver may also advise you to take medicines usually used for acute gout treatment. Be sure you understand your caregiver's directions.  You should also discuss dietary treatment with your caregiver. Certain foods such as meats and fish can increase uric acid levels. Other foods such as dairy can decrease levels. Your caregiver can give you a list of foods  to avoid.  HOME CARE INSTRUCTIONS    Do not take aspirin to relieve pain. This raises uric acid levels.   Only take over-the-counter or prescription medicines for pain, discomfort, or fever as directed by your caregiver.   Rest the joint as much as possible. When in bed, keep sheets and blankets off painful areas.   Keep the affected joint raised (elevated).   Use crutches if the painful joint is in your leg.   Drink enough water and fluids to keep your urine clear or pale yellow. This helps your body get rid of uric acid. Do not drink alcoholic beverages. They slow the passage of uric acid.   Follow your caregiver's dietary instructions. Pay careful attention to the amount of protein you eat. Your daily diet should emphasize fruits, vegetables, whole grains, and fat-free or low-fat milk products.   Maintain a healthy body weight.  SEEK MEDICAL CARE IF:    You have an oral temperature above 102 F (38.9 C).   You develop diarrhea, vomiting, or any side effects from medicines.   You do not feel better in 24 hours, or you are getting worse.  SEEK IMMEDIATE MEDICAL CARE IF:    Your joint becomes suddenly more tender and you have:   Chills.   An oral temperature above 102 F (38.9 C), not controlled by medicine.  MAKE SURE YOU:    Understand these instructions.   Will watch your condition.   Will get help right away if you are not doing well or get worse.  Document Released: 09/24/2000 Document Revised: 12/20/2011 Document Reviewed: 01/05/2010  Exit[employer] Patient Information 2014 [employer].

## 2013-06-01 NOTE — Progress Notes (Signed)
  Subjective:    Patient ID: Mariah [name], female    DOB: [birth year], 57 y.o.   MRN: [id number]  HPI  Patient is a 57 year old female who presents to the clinic to discuss  Gout. This past weekend patient had to go to the Sunday emergency clinic at her orthopedist office to have her left knee joint aspirated and injected with prednisone. It was confirmed that it was gout. Orthopedist office referred her to her PCP to talk about gout options. This is only the second flare in the last year. Patient is aware that her diet has been heavy with red meats lately. Patient is very resistant to being on another daily medication if she does not have to. She also has chronic kidney disease stage III that she does not want to add more medications to the list. Today patient has no pain in her left knee.    Review of Systems     Objective:   Physical Exam  Constitutional: She is oriented to person, place, and time. She appears well-developed and well-nourished.  HENT:  Head: Normocephalic and atraumatic.  Cardiovascular: Normal rate, regular rhythm and normal heart sounds.   Pulmonary/Chest: Effort normal and breath sounds normal. She has no wheezes.  Musculoskeletal:  Full range of motion with extension and flexion of left knee. No joint tenderness to palpation. These do not appear to be swollen.  Neurological: She is alert and oriented to person, place, and time.  Skin: Skin is warm and dry.  Psychiatric: She has a normal mood and affect. Her behavior is normal.          Assessment & Plan:  Gout-I have not treated her gout in the past. Today I discussed options of daily medications allopurinol with patient. She is aware that to start allopurinol should also have to start prednisone and colchine at least for short trial with it. Patient cannot remember her last uric acid levels. Patient is aware that diet affects gout. I offered to take her off a gout diet for her to review. I discussed with her  also that since her chronic kidney disease stage III that could be affecting her flares. I discussed with patient that we could wait if she did not want to start medications today until the next layer and see how long in between flares. I did check a CMP today to recheck kidney function along with a uric acid level.  Hyperlipidemia- patient does not like to have her blood drawn about to have it all drawn together. Added to recheck of cholesterol.

## 2013-06-08 ENCOUNTER — Other Ambulatory Visit: Payer: Self-pay | Admitting: Obstetrics & Gynecology

## 2013-06-08 DIAGNOSIS — Z1231 Encounter for screening mammogram for malignant neoplasm of breast: Secondary | ICD-10-CM

## 2013-06-08 LAB — URIC ACID: Uric Acid, Serum: 10.2 mg/dL — ABNORMAL HIGH (ref 2.4–7.0)

## 2013-06-08 LAB — COMPLETE METABOLIC PANEL WITH GFR
ALT: 24 U/L (ref 0–35)
AST: 18 U/L (ref 0–37)
Albumin: 4.3 g/dL (ref 3.5–5.2)
BUN: 33 mg/dL — ABNORMAL HIGH (ref 6–23)
Calcium: 9.4 mg/dL (ref 8.4–10.5)
Chloride: 106 mEq/L (ref 96–112)
Potassium: 5 mEq/L (ref 3.5–5.3)

## 2013-06-08 LAB — LIPID PANEL
Cholesterol: 190 mg/dL (ref 0–200)
VLDL: 69 mg/dL — ABNORMAL HIGH (ref 0–40)

## 2013-06-13 ENCOUNTER — Telehealth: Payer: Self-pay | Admitting: Physician Assistant

## 2013-06-13 NOTE — Telephone Encounter (Signed)
Please call pt: Uric acid is elevated at 10.2. This makes another gout flare likely. Would you like for me to send over allopurinol that you will start 100mg  daily then increase to 100mg  twice a day and continue to increase until we can get your uric acid levels down to 6. We will also need to give you colchine to take twice a day at first and then after 3-4 days decrease to once a day to prevent a flare from starting allopurinol.   Fasting glucose is also elevated. We need to evaluate with A1C. Cr has popped back up again just a tad. Could have been a little dehydration. Stay hydrated. Will need to check kidney function in next 2-3 weeks to make sure staying stable.   LDL is good but TG are elevated. Likely because glucose also seems to be elevated.

## 2013-06-14 NOTE — Telephone Encounter (Signed)
Start allopurinol once a day for one week and then increase to twice a day for one week. After 3 weeks on the twice a day dose then we can recheck uric acid level.

## 2013-06-14 NOTE — Telephone Encounter (Signed)
Can you please clarify the titration up on the allopurinol for this pt?  And when do we recheck uric acid levels? I know this pt & she's gonna ask me for clarification :)

## 2013-06-14 NOTE — Telephone Encounter (Signed)
Notified pt of results & recommendations.  She is wanting to know if there are any natural remedies that she can try first? She states that they're leaving for the beach next week & the last thing she wants is gout while she's there.  I will order an A1c, it could not be added through [facility].  Advised pt to call when she will be swinging by the lab so I can print it & fax it downstairs.

## 2013-06-15 ENCOUNTER — Telehealth: Payer: Self-pay | Admitting: *Deleted

## 2013-06-15 ENCOUNTER — Other Ambulatory Visit: Payer: Self-pay | Admitting: Physician Assistant

## 2013-06-15 DIAGNOSIS — R7301 Impaired fasting glucose: Secondary | ICD-10-CM

## 2013-06-15 MED ORDER — COLCHICINE 0.6 MG PO TABS: ORAL_TABLET | ORAL | Status: DC

## 2013-06-15 MED ORDER — ALLOPURINOL 100 MG PO TABS: ORAL_TABLET | ORAL | Status: DC

## 2013-06-15 NOTE — Telephone Encounter (Signed)
The absolute don'ts: Avoid foods with a high purine content. The following foods contain 100 to 1 000 mg of purine nitrogen per 100 g of food: Anchovies  Brains  Consomm  Goose  Gravy  Heart  Herring  Kidney  Mackerel  Meat extracts  Mincemeat  Mussels  Roe  Sardines  Yeast (baker's and brewer's, taken as supplement)  The maybe's, and in moderation Foods with a moderate purine content These foods contain 9 to 100 mg of purine nitrogen per 100 g of food. One serving of meat, fish or poultry (90 g) or one serving of vegetables (1/2 cup) from this group, is allowed per day, depending on the condition of the patient: Asparagus  Dried beans  Lentils  Meat, fish and poultry (except the above-mentioned)  Mushrooms  Dried peas  Shellfish  Spinach

## 2013-06-15 NOTE — Telephone Encounter (Signed)
What types of foods?

## 2013-06-15 NOTE — Telephone Encounter (Signed)
Only thing natural is to stay away from foods that could trigger. Along with allopurinol i sent colchine to take daily for the first 6 months. If pt feels flare coming along she can take extra colchine every hour up to 3 times a day or symptoms of nausea and vomiting occur.

## 2013-06-15 NOTE — Telephone Encounter (Signed)
Pt notified of instructions

## 2013-06-19 LAB — HEMOGLOBIN A1C: Hgb A1c MFr Bld: 6 % — ABNORMAL HIGH (ref <5.7)

## 2013-06-22 ENCOUNTER — Telehealth: Payer: Self-pay | Admitting: *Deleted

## 2013-06-22 NOTE — Telephone Encounter (Signed)
Pt left message stating that she "don't think she can do 3 more pills" of the colchine.  She states that her knee is feeling better & she's now able to go up & down the stairs but she did in fact experience lots of diarrhea yesterday.  She is asking what the game plan is now-does she start the allopurinal now? Please advise

## 2013-06-22 NOTE — Telephone Encounter (Signed)
Pt notified of instructions. She would like for you to give her the name of an "awesome" nutritionist.  She said you could post it to her mychart

## 2013-06-22 NOTE — Telephone Encounter (Signed)
Stay on colchine once daily. After a week after flare can then add alloupurinol daily along with colchine.

## 2013-06-25 ENCOUNTER — Other Ambulatory Visit: Payer: Self-pay | Admitting: Physician Assistant

## 2013-06-25 DIAGNOSIS — E669 Obesity, unspecified: Secondary | ICD-10-CM

## 2013-06-25 DIAGNOSIS — M109 Gout, unspecified: Secondary | ICD-10-CM

## 2013-06-25 NOTE — Telephone Encounter (Signed)
Ok will refer to our [facility]. We like them they seem nice and on top of it.

## 2013-06-26 ENCOUNTER — Ambulatory Visit (INDEPENDENT_AMBULATORY_CARE_PROVIDER_SITE_OTHER): Payer: BC Managed Care – PPO

## 2013-06-26 DIAGNOSIS — Z1231 Encounter for screening mammogram for malignant neoplasm of breast: Secondary | ICD-10-CM

## 2013-06-28 ENCOUNTER — Ambulatory Visit: Payer: BC Managed Care – PPO

## 2013-07-05 ENCOUNTER — Ambulatory Visit (INDEPENDENT_AMBULATORY_CARE_PROVIDER_SITE_OTHER): Payer: BC Managed Care – PPO | Admitting: Family Medicine

## 2013-07-05 DIAGNOSIS — Z23 Encounter for immunization: Secondary | ICD-10-CM

## 2013-07-05 NOTE — Progress Notes (Signed)
  Subjective:    Patient ID: Mariah [name], female    DOB: Nov 11, 1955, 57 y.o.   MRN: [id number]  HPI    Review of Systems     Objective:   Physical Exam        Assessment & Plan:  Here for flu vaccine

## 2013-07-11 ENCOUNTER — Other Ambulatory Visit: Payer: Self-pay | Admitting: Nurse Practitioner

## 2013-07-11 NOTE — Telephone Encounter (Signed)
RC from patient.  She is taking 1-1 1/2 tablets daily depending on how strenuous work has been that day.  She is a Administrator.  Pt states she has 4 tablets left.  Pt would like refill to be called in before Patty returns on 10/3 as she is leaving early that morning for [place]. Please advise refills.  Paper chart on your desk.

## 2013-07-11 NOTE — Telephone Encounter (Signed)
Pt notified #20 has been called to pharmacy per Dr. Edward Jolly.  She is agreeable and will keep appt on 08/02/13.

## 2013-07-11 NOTE — Telephone Encounter (Signed)
eScribe request for refill on CYCLOBENZAPRINE Last filled - 07/31/12 #90 X 3 Last AEX - 10.21.13 Next AEX - 08/02/13  Per Cala Bradford at [facility], pt has picked up all four fills for this medication. #90 08/08/12 #90 10/26/12 #90 01/21/13 #90 04/16/13 Message left for patient to return call.  Does pt have any pills left?

## 2013-07-15 ENCOUNTER — Other Ambulatory Visit: Payer: Self-pay | Admitting: Physician Assistant

## 2013-07-26 ENCOUNTER — Encounter: Payer: Self-pay | Admitting: Dietician

## 2013-07-26 ENCOUNTER — Encounter: Payer: BC Managed Care – PPO | Attending: "Endocrinology | Admitting: Dietician

## 2013-07-26 ENCOUNTER — Ambulatory Visit: Payer: BC Managed Care – PPO | Admitting: Dietician

## 2013-07-26 VITALS — Ht 62.0 in | Wt 204.8 lb

## 2013-07-26 DIAGNOSIS — Z713 Dietary counseling and surveillance: Secondary | ICD-10-CM | POA: Insufficient documentation

## 2013-07-26 DIAGNOSIS — E669 Obesity, unspecified: Secondary | ICD-10-CM

## 2013-07-26 NOTE — Progress Notes (Signed)
  Medical Nutrition Therapy:  Appt start time: 1630 end time:  1730.  Assessment:  Primary concerns today: Mariah [name] is here today since she would like to lose weight and also was recently diagnosed with pre-diabetes, gout,  and polycystic kidneys. Would like to get off medication since it is hard on her kidneys.  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 Eats half of her meals in a week from a restaurant (lunch most days of the week). Recently cut back on soda, ice cream, beef, and drinking more water at night.   MEDICATIONS: see list   DIETARY INTAKE:   Avoided foods include broccoli, pintos, brussels sprouts  24-hr recall:  B ( AM): coffee with 2 tsp sugar and cream   Snk ( AM): none  L ( PM): fast food, [place], [place] with sweet tea or water Snk ( PM): grapes or apples D ( PM): beef 1 x week with potatoes, grilled chicken Snk ( PM): none Beverages: 1 soda per week  Usual physical activity: nothing outside of work   Estimated energy needs: 1600 calories 180 g carbohydrates 120 g protein 44 g fat  Progress Towards Goal(s):  In progress.   Nutritional Diagnosis:  NB-1.1 Food and nutrition-related knowledge deficit As related to meal skipping, large portions, frequent restaurant meals.  As evidenced by BMI of 37.5 and Hgb A1c of 6.0%.    Intervention:  Nutrition counseling provided. Discussed some strategies to help with weight loss, pre-diabetes, and gout that Kayann agreed she could do such as:   Have breakfast everyday (can be peanut butter toast).  Pack snacks to have while you work (protein + carbohydrates). Aim to pack 2 lunches per week. Split lunch entrees with Mariah [name].  Fill half of your lunch and dinner plate with vegetables. Drink mostly water, limiting soda and sweet tea.  Starting walking 5-10 minutes 3 x week and increase amount of walking until goal of 30 minutes 5 x week. Limit red meat to 1 x week, limit seafood, beer, and  other meat (size of palm of your hand). Consider having some vegetarian meals.   Handouts given during visit include:  MyPlate  Yellow Card  15g CHO Snacks  Monitoring/Evaluation:  Dietary intake, exercise, and body weight prn.

## 2013-07-26 NOTE — Patient Instructions (Signed)
Have breakfast everyday (can be peanut butter toast).  Pack snacks to have while you work (protein + carbohydrates). Aim to pack 2 lunches per week. Split lunch entrees with Pam.  Fill half of your lunch and dinner plate with vegetables. Drink mostly water, limiting soda and sweet tea.  Starting walking 5-10 minutes 3 x week and increase amount of walking until goal of 30 minutes 5 x week. Limit red meat to 1 x week, limit seafood, beer, and other meat (size of palm of your hand). Consider having some vegetarian meals.

## 2013-08-01 ENCOUNTER — Encounter: Payer: Self-pay | Admitting: Nurse Practitioner

## 2013-08-02 ENCOUNTER — Ambulatory Visit (INDEPENDENT_AMBULATORY_CARE_PROVIDER_SITE_OTHER): Payer: BC Managed Care – PPO | Admitting: Nurse Practitioner

## 2013-08-02 ENCOUNTER — Encounter: Payer: Self-pay | Admitting: Nurse Practitioner

## 2013-08-02 VITALS — BP 126/80 | HR 72 | Resp 16 | Ht 62.5 in | Wt 205.0 lb

## 2013-08-02 DIAGNOSIS — Z01419 Encounter for gynecological examination (general) (routine) without abnormal findings: Secondary | ICD-10-CM

## 2013-08-02 DIAGNOSIS — Z0000 Encounter for general adult medical examination without abnormal findings: Secondary | ICD-10-CM

## 2013-08-02 LAB — POCT URINALYSIS DIPSTICK
Bilirubin, UA: NEGATIVE
Blood, UA: NEGATIVE
Glucose, UA: NEGATIVE

## 2013-08-02 MED ORDER — CYCLOBENZAPRINE HCL 10 MG PO TABS: ORAL_TABLET | ORAL | Status: DC

## 2013-08-02 MED ORDER — VENLAFAXINE HCL 75 MG PO TABS: 75.0000 mg | ORAL_TABLET | Freq: Every day | ORAL | Status: DC

## 2013-08-02 NOTE — Progress Notes (Signed)
Patient ID: Mariah [name], female   DOB: 1956-06-20, 57 y.o.   MRN: [id number] 57 y.o. G0P0 Married Caucasian Fe here for annual exam.  Now on increase dose of Toprol to help with A-Fib, SVT.  She feels well most of time except for muscle pain secondary to fibromyalgia if overworked.  No LMP recorded. Patient is postmenopausal.          Sexually active: yes same female partner, married end of last year. The current method of family planning is none.    Exercising: no  The patient has a physically strenuous job, but has no regular exercise apart from work.  Smoker:  no  Health Maintenance: Pap: 07/31/12, WNL, neg HR HPV MMG: 06/26/13, BI-RADS 1: negative Colonoscopy: 03/14/04, normal, 10 year recall BMD: 01/06/11, Normal TDaP: 07/27/11 Shingles vaccine: 03/2013 Flu vaccine: 06/2013 Labs: HB: PCP Urine:  2+ protein, pH 5.0   reports that she quit smoking about 15 years ago. She does not have any smokeless tobacco history on file. She reports that she drinks about 1.2 ounces of alcohol per week. She reports that she does not use illicit drugs.  Past Medical History  Diagnosis Date  . Hypertension   . Arthritis   . Atrial fibrillation   . Hyperlipidemia   . Polycystic kidney disease   . SDH (subdural hematoma)     Past Surgical History  Procedure Laterality Date  . Radial head implant  09/24/2011    Procedure: RADIAL HEAD IMPLANT;  Surgeon: Marlowe Shores, MD;  Location: [facility];  Service: Orthopedics;  Laterality: Left;  left radial head replacement  . Orif elbow fracture  10/22/2011    Procedure: OPEN REDUCTION INTERNAL FIXATION (ORIF) ELBOW/OLECRANON FRACTURE;  Surgeon: Marlowe Shores, MD;  Location: [facility];  Service: Orthopedics;  Laterality: Left;  open reduction  possible lateral reconstruction with palmaris graft from left or right side.  . Evacuation of subdural hematoma    . Tonsillectomy      Current Outpatient Prescriptions   Medication Sig Dispense Refill  . allopurinol (ZYLOPRIM) 100 MG tablet Take one tablet daily for one week then increase to twice a day.  60 tablet  1  . amLODipine-atorvastatin ([name]) 10-80 MG per tablet Take one tablet by mouth one time daily  30 tablet  3  . colchicine 0.6 MG tablet Take one tablet daily.  30 tablet  6  . cyclobenzaprine (FLEXERIL) 10 MG tablet Take one-half to one tablet by mouth nightly at bedtime.  20 tablet  0  . fluconazole (DIFLUCAN) 150 MG tablet Take 1 tablet (150 mg total) by mouth once.  1 tablet  1  . metoprolol succinate (TOPROL XL) 50 MG 24 hr tablet Take 1 tablet (50 mg total) by mouth daily. Take with or immediately following a meal.  30 tablet  12  . metoprolol succinate (TOPROL-XL) 100 MG 24 hr tablet TAKE ONE AND ONE HALF TABLETS ONCE DAILY Take with or immediately following a meal.  30 tablet  12  . valsartan (DIOVAN) 160 MG tablet Take 1 tablet (160 mg total) by mouth daily.  30 tablet  6  . venlafaxine (EFFEXOR) 75 MG tablet Take 75 mg by mouth daily.        No current facility-administered medications for this visit.    Family History  Problem Relation Age of Onset  . Polycystic kidney disease Father   . Heart disease Mother     Rheumatic fever  ROS:  Pertinent items are noted in HPI.  Otherwise, a comprehensive ROS was negative.  Exam:   BP 126/80  Pulse 72  Resp 16  Ht 5' 2.5" (1.588 m)  Wt 205 lb (92.987 kg)  BMI 36.87 kg/m2 Height: 5' 2.5" (158.8 cm)  Ht Readings from Last 3 Encounters:  08/02/13 5' 2.5" (1.588 m)  07/26/13 5\' 2"  (1.575 m)  04/14/13 5' 2.5" (1.588 m)    General appearance: alert, cooperative and appears stated age Head: Normocephalic, without obvious abnormality, atraumatic Neck: no adenopathy, supple, symmetrical, trachea midline and thyroid normal to inspection and palpation Lungs: clear to auscultation bilaterally Breasts: normal appearance, no masses or tenderness Heart: regular rate and rhythm Abdomen:  soft, non-tender; no masses,  no organomegaly Extremities: extremities normal, atraumatic, no cyanosis or edema Skin: Skin color, texture, turgor normal. No rashes or lesions Lymph nodes: Cervical, supraclavicular, and axillary nodes normal. No abnormal inguinal nodes palpated Neurologic: Grossly normal   Pelvic: External genitalia:  no lesions              Urethra:  normal appearing urethra with no masses, tenderness or lesions              Bartholin's and Skene's: normal                 Vagina: normal appearing vagina with normal color and discharge, no lesions              Cervix: anteverted              Pap taken: no Bimanual Exam:  Uterus:  normal size, contour, position, consistency, mobility, non-tender              Adnexa: no mass, fullness, tenderness               Rectovaginal: Confirms               Anus:  normal sphincter tone, no lesions  A:  Well Woman with normal exam  Fibromyalgia  Situational depression and anxiety  History of SVT     P:   Pap smear as per guidelines Not done  Refill of Effexor for a year  Refill on Flexeril for a year to use prn  Mammogram due 9/15  Counseled on breast self exam, adequate intake of calcium and vitamin D, diet and exercise return annually or prn  An After Visit Summary was printed and given to the patient.

## 2013-08-02 NOTE — Patient Instructions (Signed)

## 2013-08-03 ENCOUNTER — Other Ambulatory Visit: Payer: Self-pay | Admitting: Nurse Practitioner

## 2013-08-03 NOTE — Telephone Encounter (Signed)
eScribe request for refill on VENLAFAXINE Last filled - 08/02/13 X 1 YEAR Last AEX - 08/02/13 RX denied.  1 year supply given on 10/23.

## 2013-08-05 NOTE — Progress Notes (Signed)
Encounter reviewed by Dr. Capone Schwinn Silva.  

## 2013-08-06 ENCOUNTER — Other Ambulatory Visit: Payer: Self-pay | Admitting: Obstetrics & Gynecology

## 2013-08-06 ENCOUNTER — Other Ambulatory Visit: Payer: Self-pay | Admitting: Orthopedic Surgery

## 2013-08-06 ENCOUNTER — Telehealth: Payer: Self-pay | Admitting: Nurse Practitioner

## 2013-08-06 MED ORDER — CYCLOBENZAPRINE HCL 10 MG PO TABS: ORAL_TABLET | ORAL | Status: DC

## 2013-08-06 MED ORDER — VENLAFAXINE HCL ER 75 MG PO CP24: 75.0000 mg | ORAL_CAPSULE | Freq: Every day | ORAL | Status: DC

## 2013-08-06 NOTE — Telephone Encounter (Signed)
Chief Complaint  Patient presents with   Medication Management  [facility] regarding pt's Rx for the Effexor. Says she was on the extended relief in the past.  Their number is [phone].

## 2013-08-07 ENCOUNTER — Other Ambulatory Visit: Payer: Self-pay | Admitting: Orthopedic Surgery

## 2013-08-07 NOTE — Telephone Encounter (Signed)
Spoke with tech at [facility] who reports the prescription was clarified yesterday and nothing further is needed.

## 2013-09-14 ENCOUNTER — Encounter: Payer: Self-pay | Admitting: Physician Assistant

## 2013-09-14 ENCOUNTER — Ambulatory Visit (INDEPENDENT_AMBULATORY_CARE_PROVIDER_SITE_OTHER): Payer: BC Managed Care – PPO | Admitting: Physician Assistant

## 2013-09-14 VITALS — BP 122/82 | HR 81 | Temp 97.7°F | Wt 205.0 lb

## 2013-09-14 DIAGNOSIS — J019 Acute sinusitis, unspecified: Secondary | ICD-10-CM

## 2013-09-14 DIAGNOSIS — J209 Acute bronchitis, unspecified: Secondary | ICD-10-CM

## 2013-09-14 MED ORDER — METHYLPREDNISOLONE SODIUM SUCC 125 MG IJ SOLR
125.0000 mg | Freq: Once | INTRAMUSCULAR | Status: AC
  Administered 2013-09-14: 125 mg via INTRAMUSCULAR

## 2013-09-14 MED ORDER — HYDROCODONE-HOMATROPINE 5-1.5 MG/5ML PO SYRP: 5.0000 mL | ORAL_SOLUTION | Freq: Every evening | ORAL | Status: DC | PRN

## 2013-09-14 MED ORDER — AZITHROMYCIN 250 MG PO TABS: ORAL_TABLET | ORAL | Status: DC

## 2013-09-14 NOTE — Patient Instructions (Signed)
Start zpack and hycodan at bedtime.   Acute Bronchitis Bronchitis is inflammation of the airways that extend from the windpipe into the lungs (bronchi). The inflammation often causes mucus to develop. This leads to a cough, which is the most common symptom of bronchitis.  In acute bronchitis, the condition usually develops suddenly and goes away over time, usually in a couple weeks. Smoking, allergies, and asthma can make bronchitis worse. Repeated episodes of bronchitis may cause further lung problems.  CAUSES Acute bronchitis is most often caused by the same virus that causes a cold. The virus can spread from person to person (contagious).  SIGNS AND SYMPTOMS   Cough.   Fever.   Coughing up mucus.   Body aches.   Chest congestion.   Chills.   Shortness of breath.   Sore throat.  DIAGNOSIS  Acute bronchitis is usually diagnosed through a physical exam. Tests, such as chest X-rays, are sometimes done to rule out other conditions.  TREATMENT  Acute bronchitis usually goes away in a couple weeks. Often times, no medical treatment is necessary. Medicines are sometimes given for relief of fever or cough. Antibiotics are usually not needed but may be prescribed in certain situations. In some cases, an inhaler may be recommended to help reduce shortness of breath and control the cough. A cool mist vaporizer may also be used to help thin bronchial secretions and make it easier to clear the chest.  HOME CARE INSTRUCTIONS  Get plenty of rest.   Drink enough fluids to keep your urine clear or pale yellow (unless you have a medical condition that requires fluid restriction). Increasing fluids may help thin your secretions and will prevent dehydration.   Only take over-the-counter or prescription medicines as directed by your health care provider.   Avoid smoking and secondhand smoke. Exposure to cigarette smoke or irritating chemicals will make bronchitis worse. If you are a  smoker, consider using nicotine gum or skin patches to help control withdrawal symptoms. Quitting smoking will help your lungs heal faster.   Reduce the chances of another bout of acute bronchitis by washing your hands frequently, avoiding people with cold symptoms, and trying not to touch your hands to your mouth, nose, or eyes.   Follow up with your health care provider as directed.  SEEK MEDICAL CARE IF: Your symptoms do not improve after 1 week of treatment.  SEEK IMMEDIATE MEDICAL CARE IF:  You develop an increased fever or chills.   You have chest pain.   You have severe shortness of breath.  You have bloody sputum.   You develop dehydration.  You develop fainting.  You develop repeated vomiting.  You develop a severe headache. MAKE SURE YOU:   Understand these instructions.  Will watch your condition.  Will get help right away if you are not doing well or get worse. Document Released: 11/04/2004 Document Revised: 05/30/2013 Document Reviewed: 03/20/2013 [facility] Patient Information 2014 [place], [place].

## 2013-09-14 NOTE — Progress Notes (Signed)
   Subjective:    Patient ID: Mariah [name], female    DOB: 1956/07/15, 57 y.o.   MRN: [id number]  HPI Patient is a 57 yo female who presents to the clinic 2-3 days of upper respiratory symptoms that continue to worsen. Today she has a ST, ear pain, sinus pressure, and chest tightness. She feels very wheezing. She has been hot and chold but does not remember ever having a fever. No nausea, vomiting, diarrrhea 2 days. ST on 2 days. Chest tight ears popping. Fever felt like but not chek.    Review of Systems     Objective:   Physical Exam  Constitutional: She is oriented to person, place, and time. She appears well-developed and well-nourished.  HENT:  Head: Normocephalic and atraumatic.  Right Ear: External ear normal.  Left Ear: External ear normal.  Nose: Nose normal.  Mouth/Throat: Oropharynx is clear and moist.  Maxillary tenderness to palpation bilaterally.   Eyes: Conjunctivae are normal. Right eye exhibits discharge. Left eye exhibits discharge.  Watery eyes bilaterally.   Neck: Normal range of motion. Neck supple.  Cardiovascular: Normal rate, regular rhythm and normal heart sounds.   Pulmonary/Chest: Effort normal and breath sounds normal. She has no wheezes.  Lymphadenopathy:    She has no cervical adenopathy.  Neurological: She is alert and oriented to person, place, and time.  Skin: Skin is warm and dry.  Psychiatric: She has a normal mood and affect. Her behavior is normal.          Assessment & Plan:  Acute bronchitis/sinusitis- Gave shot of solumedrol in office today 125mg . Sent home with zpak and hycodan. REST and stay hydrated. Call if not improving or worsening.

## 2013-09-26 ENCOUNTER — Other Ambulatory Visit: Payer: Self-pay | Admitting: Physician Assistant

## 2013-09-28 ENCOUNTER — Ambulatory Visit (INDEPENDENT_AMBULATORY_CARE_PROVIDER_SITE_OTHER): Payer: BC Managed Care – PPO | Admitting: Physician Assistant

## 2013-09-28 ENCOUNTER — Encounter: Payer: Self-pay | Admitting: Physician Assistant

## 2013-09-28 VITALS — BP 135/74 | HR 75 | Wt 205.0 lb

## 2013-09-28 DIAGNOSIS — S99922A Unspecified injury of left foot, initial encounter: Secondary | ICD-10-CM

## 2013-09-28 DIAGNOSIS — S8990XA Unspecified injury of unspecified lower leg, initial encounter: Secondary | ICD-10-CM

## 2013-09-28 MED ORDER — TRAMADOL HCL 50 MG PO TABS: 50.0000 mg | ORAL_TABLET | Freq: Four times a day (QID) | ORAL | Status: DC | PRN

## 2013-09-28 NOTE — Progress Notes (Signed)
   Subjective:    Patient ID: Mariah [name], female    DOB: May 18, 1956, 57 y.o.   MRN: [id number]  HPI Patient is a 58 year old female who presents to the clinic after the foot injury due to stepping on dog bone last night. After stepping on dog bone patient reports 8/10 pain. Pain woke patient up in the middle of the night. There was no abrasion or laceration to the foot. Patient is not able to take any anti-inflammatories until 24 more hours. She had a skin cancer removed off her nose. She has taken Tylenol but feels like it did not help. There is a lot of pain when she tries to put weight on her left foot. She has not done anything else to make better and any movement or weight seems to make worse. Per patient there was no role of the ankle or any injury other than direct impact of foot on the plantar surface.    Review of Systems     Objective:   Physical Exam  Skin:  Left foot; no bruising noted. Some minimal swelling noted on the lateral side of plantar surface of foot. No tenderness of the ankle to palpation. There is some mild tenderness on the plantar surface and the arch of left foot. Range of motion is limited due to pain. Patient is able to wiggle her toes. Strength is limited 3/5 due to pain.           Assessment & Plan:  Left foot injury- reassured patient that I suspect there is some soft tissue damage to trauma to bottom of foot from dog bone. There is no signs of infection were abrasion to skin. Consider an x-ray but I do not think they will heal much more information. Since weightbearing so painful offered to put her in a postop boot. Patient tried on postop did not feel like it made pain better. Discussed with patient rest, ice, compression with Ace bandage and elevation. I like for patient to stay off foot for the next 24 hours and slowly work on full weightbearing. Patient does have crutches at home and she can use this for the next 24-48 hours. I did give tramadol for  break through pain and after tomorrow she can add in ibuprofen to help with some of the inflammation. If not improving or worsening cough this.

## 2013-09-28 NOTE — Patient Instructions (Addendum)
REST, ICE, compression, elevation. Tramadol as needed for pain.

## 2013-11-24 ENCOUNTER — Other Ambulatory Visit: Payer: Self-pay | Admitting: Physician Assistant

## 2013-12-21 ENCOUNTER — Encounter: Payer: Self-pay | Admitting: Physician Assistant

## 2013-12-21 ENCOUNTER — Ambulatory Visit (INDEPENDENT_AMBULATORY_CARE_PROVIDER_SITE_OTHER): Payer: BC Managed Care – PPO | Admitting: Physician Assistant

## 2013-12-21 VITALS — BP 132/80 | HR 83 | Temp 97.7°F | Wt 210.0 lb

## 2013-12-21 DIAGNOSIS — B9689 Other specified bacterial agents as the cause of diseases classified elsewhere: Secondary | ICD-10-CM

## 2013-12-21 DIAGNOSIS — A499 Bacterial infection, unspecified: Secondary | ICD-10-CM

## 2013-12-21 DIAGNOSIS — R059 Cough, unspecified: Secondary | ICD-10-CM

## 2013-12-21 DIAGNOSIS — R05 Cough: Secondary | ICD-10-CM

## 2013-12-21 DIAGNOSIS — J329 Chronic sinusitis, unspecified: Secondary | ICD-10-CM

## 2013-12-21 MED ORDER — AZITHROMYCIN 250 MG PO TABS: ORAL_TABLET | ORAL | Status: DC

## 2013-12-21 MED ORDER — FLUCONAZOLE 150 MG PO TABS: 150.0000 mg | ORAL_TABLET | Freq: Once | ORAL | Status: DC

## 2013-12-21 MED ORDER — HYDROCODONE-HOMATROPINE 5-1.5 MG/5ML PO SYRP: 5.0000 mL | ORAL_SOLUTION | Freq: Every evening | ORAL | Status: DC | PRN

## 2013-12-21 NOTE — Progress Notes (Signed)
   Subjective:    Patient ID: Mariah [name], female    DOB: 14-Jul-1956, 58 y.o.   MRN: [id number]  HPI Pt presents to the clinic with sinus pressure/pain for last 4-5 days. Her partner has been sick for the last week. She denies any fever, chills, nausea, SOB. She has had a dry cough and occasional wheeze. She has some ear pain and congestion but no ST. Tried mucinex, nyquil and hycodan. Hycodan helped cough in order to sleep. Tremendous head pressure.    Review of Systems     Objective:   Physical Exam  Constitutional: She is oriented to person, place, and time. She appears well-developed and well-nourished.  HENT:  Head: Normocephalic and atraumatic.  Right Ear: External ear normal.  Left Ear: External ear normal.  TM's injected.  Oropharynx is erythematous with PNd present.  Turbinates are red and swollen bilaterally.  Maxillary pressure to palpation.   Eyes: Conjunctivae are normal. Right eye exhibits no discharge. Left eye exhibits no discharge.  Neck: Normal range of motion. Neck supple.  Cardiovascular: Normal rate, regular rhythm and normal heart sounds.   Pulmonary/Chest: Effort normal.  Few expiratory wheezes bilateral lungs. No rhonchi.  Lymphadenopathy:    She has no cervical adenopathy.  Neurological: She is alert and oriented to person, place, and time.  Skin: Skin is dry.  Psychiatric: She has a normal mood and affect. Her behavior is normal.          Assessment & Plan:  Bacterial sinusitis- treated with zpak due to great results in hx. Hycodan given for cough. Discussed OTC treatment. Diflucan for hx of yeast infection after abx usages. Consider using albuterol inhaler if wheezing or cough worsens. Not improving call office.

## 2013-12-21 NOTE — Patient Instructions (Signed)

## 2014-02-26 ENCOUNTER — Other Ambulatory Visit: Payer: Self-pay | Admitting: Physician Assistant

## 2014-03-11 ENCOUNTER — Other Ambulatory Visit: Payer: Self-pay | Admitting: Physician Assistant

## 2014-03-25 ENCOUNTER — Other Ambulatory Visit: Payer: Self-pay | Admitting: Physician Assistant

## 2014-04-16 ENCOUNTER — Other Ambulatory Visit: Payer: Self-pay | Admitting: Physician Assistant

## 2014-04-25 ENCOUNTER — Other Ambulatory Visit: Payer: Self-pay | Admitting: Physician Assistant

## 2014-04-26 ENCOUNTER — Other Ambulatory Visit: Payer: Self-pay | Admitting: Physician Assistant

## 2014-05-09 ENCOUNTER — Other Ambulatory Visit: Payer: Self-pay | Admitting: Cardiology

## 2014-05-16 ENCOUNTER — Other Ambulatory Visit: Payer: Self-pay | Admitting: *Deleted

## 2014-05-16 ENCOUNTER — Other Ambulatory Visit: Payer: Self-pay | Admitting: Physician Assistant

## 2014-05-20 ENCOUNTER — Encounter: Payer: Self-pay | Admitting: Physician Assistant

## 2014-05-20 ENCOUNTER — Ambulatory Visit (INDEPENDENT_AMBULATORY_CARE_PROVIDER_SITE_OTHER): Payer: BC Managed Care – PPO | Admitting: Physician Assistant

## 2014-05-20 VITALS — BP 142/78 | HR 86 | Ht 62.5 in | Wt 202.0 lb

## 2014-05-20 DIAGNOSIS — I10 Essential (primary) hypertension: Secondary | ICD-10-CM | POA: Diagnosis not present

## 2014-05-20 DIAGNOSIS — J209 Acute bronchitis, unspecified: Secondary | ICD-10-CM

## 2014-05-20 DIAGNOSIS — J0100 Acute maxillary sinusitis, unspecified: Secondary | ICD-10-CM

## 2014-05-20 MED ORDER — AZITHROMYCIN 250 MG PO TABS: ORAL_TABLET | ORAL | Status: DC

## 2014-05-20 MED ORDER — AMLODIPINE-ATORVASTATIN 10-80 MG PO TABS: ORAL_TABLET | ORAL | Status: DC

## 2014-05-20 MED ORDER — HYDROCODONE-HOMATROPINE 5-1.5 MG/5ML PO SYRP: 5.0000 mL | ORAL_SOLUTION | Freq: Every evening | ORAL | Status: DC | PRN

## 2014-05-20 NOTE — Progress Notes (Signed)
   Subjective:    Patient ID: Mariah [name], female    DOB: 09/16/1956, 58 y.o.   MRN: [id number]  HPI Pt presents to the clinic with 2 days of cough and sinus pressure. Her cough is dry and productive. She was not able to sleep last night due to cough. Her female partner has been sick for 5 days and just starting to improve. Pt has hx of getting sinus infection/head colds and progressing to asthma exacerbation and bronchitis.  She is not on any ongoing asthma medication. She denies any fever but has felt hot and cold off and on. Her chest does feel tight. Bilaterally ear feel itchy. ST in am. Wheezing at night has started. Pt has tried mucinex D and tylenol cold and sinus with minimal relief. They leave for vacation tomorrow.   HTN- needs refill. No CP, palpitations, SOB, headache or vision changes ongoing.    Review of Systems  All other systems reviewed and are negative.      Objective:   Physical Exam  Constitutional: She is oriented to person, place, and time. She appears well-developed and well-nourished.  HENT:  Head: Normocephalic and atraumatic.  Right Ear: External ear normal.  Left Ear: External ear normal.  Mouth/Throat: Oropharynx is clear and moist.  TM slightly bulging bilaterally with fluid behind TM.   Pressure to palpation over bilateral maxillary sinuses.   Nasal turbinates red and swollen.   Eyes: Conjunctivae are normal. Right eye exhibits no discharge. Left eye exhibits no discharge.  Neck: Normal range of motion. Neck supple.  Cardiovascular: Normal rate, regular rhythm and normal heart sounds.   Pulmonary/Chest: Effort normal and breath sounds normal. She has no wheezes.  Lymphadenopathy:    She has no cervical adenopathy.  Neurological: She is alert and oriented to person, place, and time.  Skin: Skin is dry.  Psychiatric: She has a normal mood and affect. Her behavior is normal.          Assessment & Plan:  Acute maxillary sinusitis/acute  bronchitis- treated with zpak for 5 days. Hycodan given for cough at night. HO given. Continue with mucinex twice daily.   HTN- caduet given for 6 months. BP not great today but has been coughing all day. Recheck at home/pharmacy make sure under 140/90.

## 2014-06-06 ENCOUNTER — Other Ambulatory Visit: Payer: Self-pay | Admitting: *Deleted

## 2014-06-06 MED ORDER — METOPROLOL SUCCINATE ER 50 MG PO TB24: ORAL_TABLET | ORAL | Status: DC

## 2014-06-14 ENCOUNTER — Other Ambulatory Visit: Payer: Self-pay | Admitting: *Deleted

## 2014-06-14 MED ORDER — VALSARTAN 160 MG PO TABS: ORAL_TABLET | ORAL | Status: DC

## 2014-06-20 ENCOUNTER — Other Ambulatory Visit: Payer: Self-pay | Admitting: *Deleted

## 2014-06-20 MED ORDER — AMLODIPINE-ATORVASTATIN 10-80 MG PO TABS: ORAL_TABLET | ORAL | Status: DC

## 2014-06-26 ENCOUNTER — Encounter: Payer: Self-pay | Admitting: Cardiology

## 2014-06-26 ENCOUNTER — Ambulatory Visit (INDEPENDENT_AMBULATORY_CARE_PROVIDER_SITE_OTHER): Payer: BC Managed Care – PPO | Admitting: Cardiology

## 2014-06-26 ENCOUNTER — Ambulatory Visit (INDEPENDENT_AMBULATORY_CARE_PROVIDER_SITE_OTHER): Payer: BC Managed Care – PPO | Admitting: Physician Assistant

## 2014-06-26 VITALS — BP 162/74 | HR 68 | Ht 63.0 in | Wt 208.0 lb

## 2014-06-26 VITALS — Temp 97.2°F

## 2014-06-26 DIAGNOSIS — E669 Obesity, unspecified: Secondary | ICD-10-CM

## 2014-06-26 DIAGNOSIS — Z23 Encounter for immunization: Secondary | ICD-10-CM

## 2014-06-26 DIAGNOSIS — E785 Hyperlipidemia, unspecified: Secondary | ICD-10-CM

## 2014-06-26 DIAGNOSIS — I4891 Unspecified atrial fibrillation: Secondary | ICD-10-CM

## 2014-06-26 DIAGNOSIS — I10 Essential (primary) hypertension: Secondary | ICD-10-CM

## 2014-06-26 DIAGNOSIS — R002 Palpitations: Secondary | ICD-10-CM

## 2014-06-26 MED ORDER — ASPIRIN EC 81 MG PO TBEC: 81.0000 mg | DELAYED_RELEASE_TABLET | Freq: Every day | ORAL | Status: DC

## 2014-06-26 MED ORDER — VALSARTAN 320 MG PO TABS: 320.0000 mg | ORAL_TABLET | Freq: Every day | ORAL | Status: DC

## 2014-06-26 NOTE — Assessment & Plan Note (Signed)
Continue statin. 

## 2014-06-26 NOTE — Assessment & Plan Note (Signed)
Continue beta blocker. 

## 2014-06-26 NOTE — Assessment & Plan Note (Signed)
Discussed weight loss. 

## 2014-06-26 NOTE — Progress Notes (Signed)
HPI: FU hypertension and atrial fibrillation. Patient previously followed in [place]. She apparently had a monitor previously that showed short episodes of atrial fibrillation for up to 1 minute. Patient had a stress Myoview in July 2011 that was normal. Her ejection fraction was 67%. Echocardiogram in April of 2014 showed normal LV function, grade 1 diastolic dysfunction, mild left ventricular hypertrophy and mild left atrial enlargement. Cardiac monitor repeated in April of 2014 because of palpitations. This revealed sinus rhythm. Since she was last seen, the patient has dyspnea with more extreme activities but not with routine activities. It is relieved with rest. It is not associated with chest pain. There is no orthopnea, PND or pedal edema. There is no syncope or palpitations. There is no exertional chest pain.    Current Outpatient Prescriptions  Medication Sig Dispense Refill  . amLODipine-atorvastatin ([name]) 10-80 MG per tablet TAKE ONE TABLET BY MOUTH ONE TIME DAILY  30 tablet  5  . COLCRYS 0.6 MG tablet TAKE ONE TABLET BY MOUTH ONE TIME DAILY   30 tablet  5  . cyclobenzaprine (FLEXERIL) 10 MG tablet Take one-half to one tablet by mouth nightly at bedtime.  90 tablet  4  . loratadine (CLARITIN) 10 MG tablet Take 10 mg by mouth daily.      . metoprolol succinate (TOPROL-XL) 100 MG 24 hr tablet Take one tablet by mouth one time daily immediately following a meal  30 tablet  11  . metoprolol succinate (TOPROL-XL) 50 MG 24 hr tablet TAKE ONE TABLET BY MOUTH ONE TIME DAILY with or immediately following a meal  30 tablet  0  . Multiple Vitamin (MULTIVITAMIN) tablet Take 1 tablet by mouth daily. Centrum Silver      . Probiotic Product (PHILLIPS COLON HEALTH PO) Take 1 capsule by mouth daily.      . traMADol (ULTRAM) 50 MG tablet Take 1 tablet (50 mg total) by mouth every 6 (six) hours as needed for moderate pain.  30 tablet  0  . valsartan (DIOVAN) 160 MG tablet TAKE ONE TABLET BY  MOUTH ONE TIME DAILY  30 tablet  0  . venlafaxine XR (EFFEXOR XR) 75 MG 24 hr capsule Take 1 capsule (75 mg total) by mouth daily.  30 capsule  12   No current facility-administered medications for this visit.     Past Medical History  Diagnosis Date  . Hypertension   . Arthritis   . Atrial fibrillation 2012  . Hyperlipidemia   . Polycystic kidney disease   . SDH (subdural hematoma)   . Gout     Past Surgical History  Procedure Laterality Date  . Radial head implant  09/24/2011    Procedure: RADIAL HEAD IMPLANT;  Surgeon: Schuyler Amor, MD;  Location: [facility];  Service: Orthopedics;  Laterality: Left;  left radial head replacement  . Orif elbow fracture  10/22/2011    Procedure: OPEN REDUCTION INTERNAL FIXATION (ORIF) ELBOW/OLECRANON FRACTURE;  Surgeon: Schuyler Amor, MD;  Location: [facility];  Service: Orthopedics;  Laterality: Left;  open reduction  possible lateral reconstruction with palmaris graft from left or right side.  . Evacuation of subdural hematoma  11/26/2000  . Tonsillectomy    . Colonoscopy  03/2004    repeat 10 years    History   Social History  . Marital Status: Single    Spouse Name: N/A    Number of Children: 0  . Years of Education: N/A  Occupational History  . OWNER    Social History Main Topics  . Smoking status: Former Smoker -- 2.00 packs/day for 24 years    Types: Cigarettes    Quit date: 12/12/1996  . Smokeless tobacco: Never Used  . Alcohol Use: 1.2 oz/week    2 Glasses of wine per week     Comment: 2-3 glasses wine per day  . Drug Use: No  . Sexual Activity: Yes    Partners: Female   Other Topics Concern  . Not on file   Social History Narrative  . No narrative on file    ROS: no fevers or chills, productive cough, hemoptysis, dysphasia, odynophagia, melena, hematochezia, dysuria, hematuria, rash, seizure activity, orthopnea, PND, pedal edema, claudication. Remaining systems are  negative.  Physical Exam: Well-developed obese in no acute distress.  Skin is warm and dry.  HEENT is normal.  Neck is supple.  Chest is clear to auscultation with normal expansion.  Cardiovascular exam is regular rate and rhythm.  Abdominal exam nontender or distended. No masses palpated. Extremities show trace edema. neuro grossly intact  ECG Sinus rhythm at a rate of 68. No ST changes. Low voltage.

## 2014-06-26 NOTE — Progress Notes (Signed)
   Subjective:    Patient ID: Mariah [name], female    DOB: Jan 31, 1956, 57 y.o.   MRN: [id number]  HPI    Review of Systems     Objective:   Physical Exam        Assessment & Plan:  Flu shot given without complication. Iran Planas PA-C

## 2014-06-26 NOTE — Assessment & Plan Note (Addendum)
Blood pressure elevated. Increase Diovan to 320 mg daily. Check potassium and renal function in one week.

## 2014-06-26 NOTE — Assessment & Plan Note (Signed)
Patient is in sinus rhythm. I asked her to take aspirin 81 mg daily. Continue beta blocker.

## 2014-06-26 NOTE — Patient Instructions (Addendum)
Your physician wants you to follow-up in: [place] will receive a reminder letter in the mail two months in advance. If you don't receive a letter, please call our office to schedule the follow-up appointment.   START ASPIRIN 81 MG ONCE DAILY  INCREASE VALSARTAN TO 320 MG ONCE DAILY  Your physician recommends that you return for lab work in: ONE WEEK= DO NOT EAT PRIOR TO LAB WORK

## 2014-07-01 ENCOUNTER — Encounter: Payer: Self-pay | Admitting: Physician Assistant

## 2014-07-01 ENCOUNTER — Other Ambulatory Visit: Payer: Self-pay | Admitting: Obstetrics & Gynecology

## 2014-07-01 DIAGNOSIS — Z0000 Encounter for general adult medical examination without abnormal findings: Secondary | ICD-10-CM

## 2014-07-06 ENCOUNTER — Other Ambulatory Visit: Payer: Self-pay | Admitting: Cardiology

## 2014-07-08 ENCOUNTER — Encounter: Payer: Self-pay | Admitting: Physician Assistant

## 2014-07-08 ENCOUNTER — Ambulatory Visit (INDEPENDENT_AMBULATORY_CARE_PROVIDER_SITE_OTHER): Payer: BC Managed Care – PPO | Admitting: Physician Assistant

## 2014-07-08 VITALS — BP 133/89 | HR 114 | Temp 98.7°F | Ht 63.0 in | Wt 203.0 lb

## 2014-07-08 DIAGNOSIS — R198 Other specified symptoms and signs involving the digestive system and abdomen: Secondary | ICD-10-CM

## 2014-07-08 DIAGNOSIS — I4891 Unspecified atrial fibrillation: Secondary | ICD-10-CM | POA: Insufficient documentation

## 2014-07-08 DIAGNOSIS — S37009A Unspecified injury of unspecified kidney, initial encounter: Secondary | ICD-10-CM | POA: Insufficient documentation

## 2014-07-08 DIAGNOSIS — I10 Essential (primary) hypertension: Secondary | ICD-10-CM | POA: Insufficient documentation

## 2014-07-08 DIAGNOSIS — R110 Nausea: Secondary | ICD-10-CM

## 2014-07-08 DIAGNOSIS — R1012 Left upper quadrant pain: Secondary | ICD-10-CM | POA: Diagnosis not present

## 2014-07-08 DIAGNOSIS — Q612 Polycystic kidney, adult type: Secondary | ICD-10-CM | POA: Insufficient documentation

## 2014-07-08 MED ORDER — PROMETHAZINE HCL 25 MG PO TABS: 25.0000 mg | ORAL_TABLET | Freq: Four times a day (QID) | ORAL | Status: DC | PRN

## 2014-07-08 MED ORDER — PROMETHAZINE HCL 25 MG/ML IJ SOLN
25.0000 mg | Freq: Once | INTRAMUSCULAR | Status: AC
  Administered 2014-07-08: 25 mg via INTRAMUSCULAR

## 2014-07-08 MED ORDER — METOPROLOL SUCCINATE ER 50 MG PO TB24: ORAL_TABLET | ORAL | Status: DC

## 2014-07-08 NOTE — Progress Notes (Signed)
   Subjective:    Patient ID: Mariah [name], female    DOB: 1956/03/13, 58 y.o.   MRN: [id number]  HPI Pt presents to the clinic with LUQ pain. Started yesterday all of a sudden. Rates pain 8/10. Worse with any movement. Radiates to left middle back. Denies any urinary symptoms. No appetitte and not eaten since dinner. No bowel changes or blood in stool. She feels nauseated but has not vomited. She has taken flexeril since last night with no improvement. Fever was 101 last night at home.     Review of Systems  All other systems reviewed and are negative.      Objective:   Physical Exam  Constitutional: She is oriented to person, place, and time. She appears well-developed and well-nourished.  HENT:  Head: Normocephalic and atraumatic.  Right Ear: External ear normal.  Left Ear: External ear normal.  Nose: Nose normal.  Mouth/Throat: Oropharynx is clear and moist. No oropharyngeal exudate.  Eyes: Conjunctivae are normal.  Neck: Normal range of motion. Neck supple.  Cardiovascular: Regular rhythm and normal heart sounds.   Tachycardia at 114.   Pulmonary/Chest: Effort normal and breath sounds normal.  Pain with palpation over left mid to upper back.   Abdominal: Soft.  LUQ guarding and rebound. Pt jumped off table with palpation.   No pain over rest of abdomen.   Lymphadenopathy:    She has no cervical adenopathy.  Neurological: She is alert and oriented to person, place, and time.  Psychiatric: She has a normal mood and affect. Her behavior is normal.          Assessment & Plan:  LUQ pain/nausea/fever- suspect pancreatitis. Needs CT. Also needs stat labs. Stat labs will take 4 hours then get CT. Pt got phenergan 25mg  IM in office and sent to ER. Pt reminded to stay NPO.

## 2014-07-10 ENCOUNTER — Ambulatory Visit: Payer: BC Managed Care – PPO

## 2014-07-10 ENCOUNTER — Telehealth: Payer: Self-pay | Admitting: *Deleted

## 2014-07-10 DIAGNOSIS — R109 Unspecified abdominal pain: Secondary | ICD-10-CM | POA: Insufficient documentation

## 2014-07-10 NOTE — Telephone Encounter (Signed)
Pam called and wanted you to be aware that Mariah [name] was being transferred to [facility] and will be placed on the kidney floor. Pam said that she was not doing well at all and presently is in renal failure and on continuous 02. She is still in a lot of pain that has not changed. Please call if you have any questions. Margette Fast, CMA

## 2014-07-10 NOTE — Telephone Encounter (Signed)
Called pt. Pt in mild renal failure. Stating 92 on 6L of O2. Not sure where pain is coming from. Being transferred to [facility].

## 2014-07-11 DIAGNOSIS — F419 Anxiety disorder, unspecified: Secondary | ICD-10-CM | POA: Insufficient documentation

## 2014-07-11 DIAGNOSIS — J9600 Acute respiratory failure, unspecified whether with hypoxia or hypercapnia: Secondary | ICD-10-CM | POA: Insufficient documentation

## 2014-07-11 DIAGNOSIS — D649 Anemia, unspecified: Secondary | ICD-10-CM | POA: Insufficient documentation

## 2014-07-16 ENCOUNTER — Telehealth: Payer: Self-pay | Admitting: *Deleted

## 2014-07-16 ENCOUNTER — Telehealth: Payer: Self-pay

## 2014-07-16 MED ORDER — HYDROCODONE-ACETAMINOPHEN 10-325 MG PO TABS: 1.0000 | ORAL_TABLET | Freq: Three times a day (TID) | ORAL | Status: DC | PRN

## 2014-07-16 NOTE — Telephone Encounter (Signed)
Ok for norco #15 tabs.

## 2014-07-16 NOTE — Telephone Encounter (Signed)
Pt left vm stating that due to all the hospital stuff she has a huge gout flare and wants to know if you could give her something for pain.  Please advise.

## 2014-07-16 NOTE — Telephone Encounter (Signed)
Mariah [name] has a flare up of gout. She has swelling, redness and pain in her left foot. Could she have a pain medication?

## 2014-07-16 NOTE — Telephone Encounter (Signed)
[place] for norco #15

## 2014-07-18 ENCOUNTER — Institutional Professional Consult (permissible substitution): Payer: BC Managed Care – PPO | Admitting: Sports Medicine

## 2014-07-25 ENCOUNTER — Encounter: Payer: Self-pay | Admitting: Sports Medicine

## 2014-07-25 ENCOUNTER — Ambulatory Visit (INDEPENDENT_AMBULATORY_CARE_PROVIDER_SITE_OTHER): Payer: BC Managed Care – PPO | Admitting: Sports Medicine

## 2014-07-25 VITALS — BP 148/89 | HR 103 | Ht 62.5 in | Wt 195.0 lb

## 2014-07-25 DIAGNOSIS — M109 Gout, unspecified: Secondary | ICD-10-CM | POA: Diagnosis not present

## 2014-07-25 NOTE — Assessment & Plan Note (Addendum)
Acute right ankle swelling. Aspiration and injection as above. Fluid will be sent off for crystal analysis. Start uloric in a week. Strapped ankle with compressive dressing. Return for custom orthotics, she does have significant transverse arch breakdown and would benefit from orthotics with metatarsal pads.

## 2014-07-25 NOTE — Progress Notes (Signed)
   Subjective:    I'm seeing this patient as a consultation for:  Iran Planas, PA-C  CC:  Right ankle pain  HPI: This is a very pleasant 58 year old female with a history of gout. She was recently hospitalized, during her hospitalization she did develop a flare of her gout. It has been uncontrolled with colchicine, she recently saw a local orthopedist who injected her knee and ankle without guidance, she had no relief, not even temporary. She does have uric acid-lowering medication, Uloric but has not yet taken this. Symptoms are severe, persistent with significant ankle swelling and pain. Uric acid levels have been greater than 10 on the most recent check.  Past medical history, Surgical history, Family history not pertinant except as noted below, Social history, Allergies, and medications have been entered into the medical record, reviewed, and no changes needed.   Review of Systems: No headache, visual changes, nausea, vomiting, diarrhea, constipation, dizziness, abdominal pain, skin rash, fevers, chills, night sweats, weight loss, swollen lymph nodes, body aches, joint swelling, muscle aches, chest pain, shortness of breath, mood changes, visual or auditory hallucinations.   Objective:   General: Well Developed, well nourished, and in no acute distress.  Neuro/Psych: Alert and oriented x3, extra-ocular muscles intact, able to move all 4 extremities, sensation grossly intact. Skin: Warm and dry, no rashes noted.  Respiratory: Not using accessory muscles, speaking in full sentences, trachea midline.  Cardiovascular: Pulses palpable, no extremity edema. Abdomen: Does not appear distended. Right Ankle: Visibly swollen with exquisite tenderness to palpation over the talocrural joint anteriorly and posteriorly. Range of motion is full in all directions. Strength is 5/5 in all directions. Stable lateral and medial ligaments; squeeze test and kleiger test unremarkable; Talar dome  nontender; No pain at base of 5th MT; No tenderness over cuboid; No tenderness over N spot or navicular prominence No tenderness on posterior aspects of lateral and medial malleolus No sign of peroneal tendon subluxations; Negative tarsal tunnel tinel's Able to walk 4 steps.  Procedure: Real-time Ultrasound Guided aspiration/Injection of right ankle Device: GE Logiq E  Verbal informed consent obtained.  Time-out conducted.  Noted no overlying erythema, induration, or other signs of local infection.  Skin prepped in a sterile fashion.  Local anesthesia: Topical Ethyl chloride.  With sterile technique and under real time ultrasound guidance:  Using a 22-gauge needle advanced into the talocrural joint which have visible effusion, I injected 1.5 cc of sterile saline, and then aspirated fluid into a 3 cc syringe, the syringe was then switched and 1 cc kenalog 40, 2 cc lidocaine injected easily. Completed without difficulty  Pain immediately resolved suggesting accurate placement of the medication.  Advised to call if fevers/chills, erythema, induration, drainage, or persistent bleeding.  Images permanently stored and available for review in the ultrasound unit.  Impression: Technically successful ultrasound guided injection.  The ankle was then strapped with compressive dressing.  Fluid will be sent off for crystal analysis and cultures.  Impression and Recommendations:   This case required medical decision making of moderate complexity.

## 2014-07-26 ENCOUNTER — Encounter: Payer: Self-pay | Admitting: Sports Medicine

## 2014-07-26 ENCOUNTER — Encounter: Payer: Self-pay | Admitting: Cardiology

## 2014-07-26 LAB — SYNOVIAL CELL COUNT + DIFF, W/ CRYSTALS
Crystals, Fluid: NONE SEEN
Eosinophils-Synovial: 0 % (ref 0–1)
Lymphocytes-Synovial Fld: 14 % (ref 0–20)
Monocyte/Macrophage: 3 % — ABNORMAL LOW (ref 50–90)
Neutrophil, Synovial: 83 % — ABNORMAL HIGH (ref 0–25)
WBC, Synovial: 685 uL — ABNORMAL HIGH (ref 0–200)

## 2014-07-29 LAB — BODY FLUID CULTURE
Gram Stain: NONE SEEN
Organism ID, Bacteria: NO GROWTH

## 2014-07-31 ENCOUNTER — Telehealth: Payer: Self-pay | Admitting: *Deleted

## 2014-07-31 NOTE — Telephone Encounter (Signed)
Spoke with pt, medications reviewed and forwarded to dr Stanford Breed for review

## 2014-08-06 ENCOUNTER — Ambulatory Visit: Payer: BC Managed Care – PPO | Admitting: Nurse Practitioner

## 2014-08-06 ENCOUNTER — Encounter: Payer: BC Managed Care – PPO | Admitting: Sports Medicine

## 2014-08-20 ENCOUNTER — Encounter: Payer: Self-pay | Admitting: Sports Medicine

## 2014-08-20 ENCOUNTER — Ambulatory Visit (INDEPENDENT_AMBULATORY_CARE_PROVIDER_SITE_OTHER): Payer: BC Managed Care – PPO | Admitting: Sports Medicine

## 2014-08-20 DIAGNOSIS — M109 Gout, unspecified: Secondary | ICD-10-CM | POA: Diagnosis not present

## 2014-08-20 MED ORDER — MAGNESIUM OXIDE 400 MG PO TABS: 800.0000 mg | ORAL_TABLET | Freq: Every day | ORAL | Status: DC

## 2014-08-20 NOTE — Progress Notes (Signed)

## 2014-08-20 NOTE — Assessment & Plan Note (Signed)
Custom orthotics as above. Right ankle is now pain-free after injection. Starting magnesium for cramping. Return to see me as needed.

## 2014-08-21 NOTE — Telephone Encounter (Signed)
Note from nephrologist wanted you to have bmp, uric acid, and UA drawn here. Please come back and can get ordered.

## 2014-08-28 ENCOUNTER — Ambulatory Visit: Payer: BC Managed Care – PPO

## 2014-08-29 ENCOUNTER — Ambulatory Visit (INDEPENDENT_AMBULATORY_CARE_PROVIDER_SITE_OTHER): Payer: BC Managed Care – PPO | Admitting: Nurse Practitioner

## 2014-08-29 ENCOUNTER — Encounter: Payer: Self-pay | Admitting: Nurse Practitioner

## 2014-08-29 VITALS — BP 130/86 | HR 96 | Ht 62.25 in | Wt 202.0 lb

## 2014-08-29 DIAGNOSIS — Z01419 Encounter for gynecological examination (general) (routine) without abnormal findings: Secondary | ICD-10-CM

## 2014-08-29 DIAGNOSIS — Q613 Polycystic kidney, unspecified: Secondary | ICD-10-CM

## 2014-08-29 MED ORDER — VENLAFAXINE HCL ER 37.5 MG PO CP24: ORAL_CAPSULE | ORAL | Status: DC

## 2014-08-29 NOTE — Patient Instructions (Signed)

## 2014-08-29 NOTE — Progress Notes (Signed)
Patient ID: Mariah [name], female   DOB: 12/28/1955, 58 y.o.   MRN: [id number] 58 y.o. G0P0 Married (partner is Janalyn Shy) Caucasian [place] here for annual exam.  She has history of polycystic kidneys and recently was in hospital with renal failure and what sounds like almost septic.  She was in intensive care at [facility] for days. Her partner (Pam)  transferred her there after she failed to improve in [place]. She has been back to work only a few days and most likely over did it yesterday as she has pain left upper abdomen from a pulled muscle.  Her maternal aunt died last week whom she was very close to.  Pam's father died 2 weeks earlier - his was expected as his Alzheimer's was worsening  Patient's last menstrual period was 07/11/2009 (approximate).          Sexually active: Yes.    The current method of family planning is none.    Exercising: Yes.    The patient has a physically strenuous job, but has no regular exercise apart from work.  Smoker:  no  Health Maintenance: Pap: 07/31/12, WNL, neg HR HPV MMG: 06/26/13, BI-RADS 1: negative, scheduled for 09/11/14 Colonoscopy: 03/14/04, normal, 10 year recall BMD: 01/06/11, Normal TDaP: 07/27/11 Labs:  PCP, multiple test at [place]   reports that she quit smoking about 17 years ago. Her smoking use included Cigarettes. She has a 48 pack-year smoking history. She has never used smokeless tobacco. She reports that she drinks about 1.2 oz of alcohol per week. She reports that she does not use illicit drugs.  Past Medical History  Diagnosis Date  . Hypertension   . Arthritis   . Atrial fibrillation 2012  . Hyperlipidemia   . Polycystic kidney disease   . SDH (subdural hematoma)   . Gout     Past Surgical History  Procedure Laterality Date  . Radial head implant  09/24/2011    Procedure: RADIAL HEAD IMPLANT;  Surgeon: Schuyler Amor, MD;  Location: [place];  Service: Orthopedics;  Laterality: Left;  left radial head  replacement  . Orif elbow fracture  10/22/2011    Procedure: OPEN REDUCTION INTERNAL FIXATION (ORIF) ELBOW/OLECRANON FRACTURE;  Surgeon: Schuyler Amor, MD;  Location: [place];  Service: Orthopedics;  Laterality: Left;  open reduction  possible lateral reconstruction with palmaris graft from left or right side.  . Evacuation of subdural hematoma  11/26/2000  . Tonsillectomy    . Colonoscopy  03/2004    repeat 10 years    Current Outpatient Prescriptions  Medication Sig Dispense Refill  . amLODipine-atorvastatin ([name]) 10-80 MG per tablet TAKE ONE TABLET BY MOUTH ONE TIME DAILY 30 tablet 5  . aspirin EC 81 MG tablet Take 1 tablet (81 mg total) by mouth daily. 90 tablet 3  . COLCRYS 0.6 MG tablet TAKE ONE TABLET BY MOUTH ONE TIME DAILY  30 tablet 5  . febuxostat (ULORIC) 40 MG tablet Take 40 mg by mouth daily.    . furosemide (LASIX) 20 MG tablet Take 20 mg by mouth daily.     [name] Kitchen labetalol (NORMODYNE) 100 MG tablet Take 0.5 tablets by mouth 2 (two) times daily.  0  . magnesium oxide (MAG-OX) 400 MG tablet Take 2 tablets (800 mg total) by mouth at bedtime. (Patient taking differently: Take 400 mg by mouth at bedtime. ) 90 tablet 3  . Multiple Vitamin (MULTIVITAMIN) tablet Take 1 tablet by mouth daily. Centrum Silver    .  Probiotic Product (PHILLIPS COLON HEALTH PO) Take 1 capsule by mouth daily.    [name] Kitchen venlafaxine XR (EFFEXOR XR) 37.5 MG 24 hr capsule Take 2 tablets daily 120 capsule 3   No current facility-administered medications for this visit.    Family History  Problem Relation Age of Onset  . Polycystic kidney disease Father   . Heart failure Father   . Heart disease Mother     Rheumatic fever    ROS:  Pertinent items are noted in HPI.  Otherwise, a comprehensive ROS was negative.  Exam:   BP 130/86 mmHg  Pulse 96  Ht 5' 2.25" (1.581 m)  Wt 202 lb (91.627 kg)  BMI 36.66 kg/m2  LMP 07/11/2009 (Approximate) Height: 5' 2.25" (158.1 cm)  Ht Readings from  Last 3 Encounters:  08/29/14 5' 2.25" (1.581 m)  08/20/14 5' 2.5" (1.588 m)  07/25/14 5' 2.5" (1.588 m)    General appearance: alert, cooperative and appears stated age.  In general does not feel well today.   Head: Normocephalic, without obvious abnormality, atraumatic Neck: no adenopathy, supple, symmetrical, trachea midline and thyroid normal to inspection and palpation Lungs: clear to auscultation bilaterally Breasts: normal appearance, no masses or tenderness Heart: regular rate and rhythm Abdomen: soft, non-tender; no masses,  no organomegaly, some discomfort upper abdomen that seems to be muscular. No flank pain. Extremities: extremities normal, atraumatic, no cyanosis or edema Skin: Skin color, texture, turgor normal. No rashes or lesions Lymph nodes: Cervical, supraclavicular, and axillary nodes normal. No abnormal inguinal nodes palpated Neurologic: Grossly normal   Pelvic: External genitalia:  no lesions              Urethra:  normal appearing urethra with no masses, tenderness or lesions              Bartholin's and Skene's: normal                 Vagina: normal appearing vagina with normal color and discharge, no lesions              Cervix: anteverted              Pap taken: No. Bimanual Exam:  Uterus:  normal size, contour, position, consistency, mobility, non-tender              Adnexa: no mass, fullness, tenderness               Rectovaginal: Confirms               Anus:  normal sphincter tone, no lesions  A:  Well Woman with normal exam  Postmenopausal no HRT  Fibromyalgia Situational depression and anxiety History of SVT, gout  History of polycystic kidney and recent kidney disease stage III9/30/15   P:   Reviewed health and wellness pertinent to exam  Pap smear not taken today  Mammogram is due now and is scheduled  She will try to taper off Effexor this coming 6 + months -will try 75 mg and alternate with 37.5 mg every other day  for a month.  Then try 37.5 mg every day for a month, then go to every other day for a month, then every 3 days for a month.  Will  try a very slow taper because I feel like she will need the medication.  Counseled on breast self exam, mammography screening, adequate intake of calcium and vitamin D, diet and exercise, Kegel's exercises return annually or prn  An After Visit  Summary was printed and given to the patient.

## 2014-08-30 ENCOUNTER — Encounter: Payer: Self-pay | Admitting: Nurse Practitioner

## 2014-09-01 NOTE — Progress Notes (Signed)
Encounter reviewed by Dr. Branston Halsted Silva.  

## 2014-09-02 ENCOUNTER — Other Ambulatory Visit: Payer: Self-pay | Admitting: *Deleted

## 2014-09-02 MED ORDER — FEBUXOSTAT 40 MG PO TABS
40.0000 mg | ORAL_TABLET | Freq: Every day | ORAL | Status: DC
Start: 2014-09-02 — End: 2015-06-17

## 2014-09-10 ENCOUNTER — Ambulatory Visit (INDEPENDENT_AMBULATORY_CARE_PROVIDER_SITE_OTHER): Payer: BC Managed Care – PPO | Admitting: Physician Assistant

## 2014-09-10 ENCOUNTER — Ambulatory Visit: Payer: BC Managed Care – PPO | Admitting: Sports Medicine

## 2014-09-10 ENCOUNTER — Encounter: Payer: Self-pay | Admitting: Physician Assistant

## 2014-09-10 VITALS — BP 138/85 | HR 115 | Ht 62.25 in | Wt 200.0 lb

## 2014-09-10 DIAGNOSIS — R600 Localized edema: Secondary | ICD-10-CM

## 2014-09-10 DIAGNOSIS — N183 Chronic kidney disease, stage 3 unspecified: Secondary | ICD-10-CM

## 2014-09-10 DIAGNOSIS — I10 Essential (primary) hypertension: Secondary | ICD-10-CM | POA: Diagnosis not present

## 2014-09-10 DIAGNOSIS — Q612 Polycystic kidney, adult type: Secondary | ICD-10-CM

## 2014-09-10 DIAGNOSIS — M1000 Idiopathic gout, unspecified site: Secondary | ICD-10-CM

## 2014-09-10 DIAGNOSIS — R000 Tachycardia, unspecified: Secondary | ICD-10-CM

## 2014-09-10 MED ORDER — AMBULATORY NON FORMULARY MEDICATION: Status: DC

## 2014-09-10 MED ORDER — METOPROLOL SUCCINATE ER 25 MG PO TB24: 25.0000 mg | ORAL_TABLET | Freq: Every day | ORAL | Status: DC

## 2014-09-10 NOTE — Progress Notes (Signed)
   Subjective:    Patient ID: Mariah [name], female    DOB: 10-Mar-1956, 58 y.o.   MRN: [id number]  HPI  Patient is a 58 year old female who presents to the clinic for follow-up on hypertension. Patient has polycystic kidney disease and stage III kidney disease. She recently was admitted to the hospital with acute renal failure due to increase in Diovan. She is being managed by Dr. Candy Sledge a nephrologist. She comes in today requesting referral because her insurance is no longer going to pay for him out of network. He had switched her metoprolol to labetalol. He has even increased it recently and he can still not get her pulse down. Patient tolerated in light metoprolol better she would like to switch back today.  Her Lasix was also recently increase to 40 mg as well due to bilateral leg and ankle edema. She also admits to taking OTC diruretic today to try to go to the bathroom more. She is not voiding regularly or increased at all. She remains swollen legs/feet with bilateral feet pain.     Review of Systems  All other systems reviewed and are negative.      Objective:   Physical Exam  Constitutional: She is oriented to person, place, and time. She appears well-developed and well-nourished.  HENT:  Head: Normocephalic and atraumatic.  Cardiovascular: Normal rate, regular rhythm and normal heart sounds.   Pulmonary/Chest: Effort normal and breath sounds normal.  Neurological: She is alert and oriented to person, place, and time.  Skin: Skin is dry.  1 and 1/2 pitting edema bilateral feet and ankles.   Psychiatric: She has a normal mood and affect. Her behavior is normal.          Assessment & Plan:  HTN/tachycardia- pt request metoprolol due to feeling better on it, pulse being lower and only once a day. Metoprolol 25 ER restarted. Stop labetalol. I did not see any reason to affect kidneys. Follow up in 2 weeks.   Pt would like new cardiologist will make referral.  Reassured pt last echo in hospital EF was good. Some mild mitral regurgitation and grade I diastolic dysfunction.   Polycystic kidney disease/CKD/bilateral leg edema-patient is being managed by Dr. Candy Sledge a nephrologist. Discussed with pt stay on same dose of lasix right now. Given compression stockings to try to help with edema. Will check BMP before adjusting dose. Please stop any OTC diuretic you have picked up.   Insurance is changing so needs a new referral will make.   Bilateral feet pain- hx of gout. Will treat with prednisone taper.

## 2014-09-11 ENCOUNTER — Encounter: Payer: Self-pay | Admitting: Physician Assistant

## 2014-09-11 ENCOUNTER — Ambulatory Visit (INDEPENDENT_AMBULATORY_CARE_PROVIDER_SITE_OTHER): Payer: BC Managed Care – PPO

## 2014-09-11 DIAGNOSIS — M1000 Idiopathic gout, unspecified site: Secondary | ICD-10-CM | POA: Insufficient documentation

## 2014-09-11 DIAGNOSIS — R000 Tachycardia, unspecified: Secondary | ICD-10-CM | POA: Insufficient documentation

## 2014-09-11 DIAGNOSIS — Z1231 Encounter for screening mammogram for malignant neoplasm of breast: Secondary | ICD-10-CM

## 2014-09-11 DIAGNOSIS — Z0000 Encounter for general adult medical examination without abnormal findings: Secondary | ICD-10-CM

## 2014-09-11 DIAGNOSIS — R600 Localized edema: Secondary | ICD-10-CM | POA: Insufficient documentation

## 2014-09-11 LAB — BASIC METABOLIC PANEL WITH GFR
BUN: 34 mg/dL — AB (ref 6–23)
CHLORIDE: 103 meq/L (ref 96–112)
CO2: 23 mEq/L (ref 19–32)
Calcium: 9.7 mg/dL (ref 8.4–10.5)
Creat: 2.77 mg/dL — ABNORMAL HIGH (ref 0.50–1.10)
GFR, EST AFRICAN AMERICAN: 21 mL/min — AB
GFR, EST NON AFRICAN AMERICAN: 18 mL/min — AB
Glucose, Bld: 77 mg/dL (ref 70–99)
Potassium: 4.4 mEq/L (ref 3.5–5.3)
Sodium: 140 mEq/L (ref 135–145)

## 2014-09-11 MED ORDER — PREDNISONE 20 MG PO TABS: ORAL_TABLET | ORAL | Status: DC

## 2014-09-13 NOTE — Telephone Encounter (Signed)
Pt informed me referral for nephrologist needed to be [facility] [place].

## 2014-09-17 ENCOUNTER — Encounter: Payer: Self-pay | Admitting: Physician Assistant

## 2014-09-18 ENCOUNTER — Other Ambulatory Visit: Payer: Self-pay | Admitting: Physician Assistant

## 2014-09-18 MED ORDER — METOPROLOL SUCCINATE ER 25 MG PO TB24: ORAL_TABLET | ORAL | Status: DC

## 2014-09-18 MED ORDER — FUROSEMIDE 20 MG PO TABS: 80.0000 mg | ORAL_TABLET | Freq: Every day | ORAL | Status: DC

## 2014-09-25 DIAGNOSIS — D509 Iron deficiency anemia, unspecified: Secondary | ICD-10-CM | POA: Insufficient documentation

## 2014-10-10 ENCOUNTER — Ambulatory Visit (INDEPENDENT_AMBULATORY_CARE_PROVIDER_SITE_OTHER): Payer: BLUE CROSS/BLUE SHIELD | Admitting: Sports Medicine

## 2014-10-10 ENCOUNTER — Ambulatory Visit (INDEPENDENT_AMBULATORY_CARE_PROVIDER_SITE_OTHER): Payer: BLUE CROSS/BLUE SHIELD

## 2014-10-10 ENCOUNTER — Encounter: Payer: Self-pay | Admitting: Physician Assistant

## 2014-10-10 ENCOUNTER — Other Ambulatory Visit: Payer: Self-pay | Admitting: *Deleted

## 2014-10-10 ENCOUNTER — Encounter: Payer: Self-pay | Admitting: Sports Medicine

## 2014-10-10 VITALS — BP 144/86 | HR 90 | Wt 190.0 lb

## 2014-10-10 DIAGNOSIS — Z1389 Encounter for screening for other disorder: Secondary | ICD-10-CM | POA: Diagnosis not present

## 2014-10-10 DIAGNOSIS — M25461 Effusion, right knee: Secondary | ICD-10-CM

## 2014-10-10 DIAGNOSIS — M1711 Unilateral primary osteoarthritis, right knee: Secondary | ICD-10-CM | POA: Insufficient documentation

## 2014-10-10 DIAGNOSIS — M25561 Pain in right knee: Secondary | ICD-10-CM

## 2014-10-10 DIAGNOSIS — M1712 Unilateral primary osteoarthritis, left knee: Secondary | ICD-10-CM | POA: Insufficient documentation

## 2014-10-10 MED ORDER — COLCHICINE 0.6 MG PO TABS
0.6000 mg | ORAL_TABLET | Freq: Every day | ORAL | Status: DC
Start: 2014-10-10 — End: 2015-06-17

## 2014-10-10 NOTE — Assessment & Plan Note (Signed)
Aspiration and injection as above. Return in one month.  x-rays.

## 2014-10-10 NOTE — Progress Notes (Signed)
  Subjective:    CC: right knee pain  HPI: This is a very pleasant 58 year old female, for sometime now she's had low-grade pain in her right knee at the joint lines, more recently she stepped wrong, twisted, and had an increase of pain and swelling. Pain is moderate, persistent and localized mostly at the joint line. She denies any mechanical symptoms. She has tried oral NSAIDs without any improvement in her pain. She does desire interventional treatment today.  Past medical history, Surgical history, Family history not pertinant except as noted below, Social history, Allergies, and medications have been entered into the medical record, reviewed, and no changes needed.   Review of Systems: No fevers, chills, night sweats, weight loss, chest pain, or shortness of breath.   Objective:    General: Well Developed, well nourished, and in no acute distress.  Neuro: Alert and oriented x3, extra-ocular muscles intact, sensation grossly intact.  HEENT: Normocephalic, atraumatic, pupils equal round reactive to light, neck supple, no masses, no lymphadenopathy, thyroid nonpalpable.  Skin: Warm and dry, no rashes. Cardiac: Regular rate and rhythm, no murmurs rubs or gallops, no lower extremity edema.  Respiratory: Clear to auscultation bilaterally. Not using accessory muscles, speaking in full sentences. Right Knee: Visibly swollen with a palpable fluid wave. Tenderness over the medial line. ROM normal in flexion and extension and lower leg rotation. Ligaments with solid consistent endpoints including ACL, PCL, LCL, MCL. Negative Mcmurray's and provocative meniscal tests. Non painful patellar compression. Patellar and quadriceps tendons unremarkable. Hamstring and quadriceps strength is normal.  Procedure: Real-time Ultrasound Guided aspiration/Injection of right knee Device: GE Logiq E  Verbal informed consent obtained.  Time-out conducted.  Noted no overlying erythema, induration, or other  signs of local infection.  Skin prepped in a sterile fashion.  Local anesthesia: Topical Ethyl chloride.  With sterile technique and under real time ultrasound guidance:  20 mL of straw-colored fluid aspirated, syringe switched and 2 mL kenalog 40, 4 mL lidocaine injected easily. Completed without difficulty  Pain immediately resolved suggesting accurate placement of the medication.  Advised to call if fevers/chills, erythema, induration, drainage, or persistent bleeding.  Images permanently stored and available for review in the ultrasound unit.  Impression: Technically successful ultrasound guided injection.  Impression and Recommendations:

## 2014-10-17 ENCOUNTER — Encounter: Payer: Self-pay | Admitting: Sports Medicine

## 2014-10-17 ENCOUNTER — Other Ambulatory Visit: Payer: Self-pay | Admitting: Nurse Practitioner

## 2014-10-17 ENCOUNTER — Encounter: Payer: Self-pay | Admitting: Nurse Practitioner

## 2014-10-17 DIAGNOSIS — S83206D Unspecified tear of unspecified meniscus, current injury, right knee, subsequent encounter: Secondary | ICD-10-CM

## 2014-10-17 MED ORDER — CYCLOBENZAPRINE HCL 10 MG PO TABS: 10.0000 mg | ORAL_TABLET | Freq: Three times a day (TID) | ORAL | Status: DC | PRN

## 2014-10-21 ENCOUNTER — Telehealth: Payer: Self-pay | Admitting: *Deleted

## 2014-10-21 NOTE — Telephone Encounter (Signed)
MRI prior auth initiated via [employer]. Case went to clinical review awaiting decision.

## 2014-10-25 NOTE — Telephone Encounter (Signed)
MRI approval received from [facility] - [id number] valid 10/25/14-11/23/14. Radiology and patient notified.

## 2014-10-28 ENCOUNTER — Ambulatory Visit (INDEPENDENT_AMBULATORY_CARE_PROVIDER_SITE_OTHER): Payer: BLUE CROSS/BLUE SHIELD

## 2014-10-28 DIAGNOSIS — M2241 Chondromalacia patellae, right knee: Secondary | ICD-10-CM

## 2014-10-28 DIAGNOSIS — S83206D Unspecified tear of unspecified meniscus, current injury, right knee, subsequent encounter: Secondary | ICD-10-CM

## 2014-10-31 ENCOUNTER — Encounter: Payer: Self-pay | Admitting: Physician Assistant

## 2014-11-04 ENCOUNTER — Ambulatory Visit (INDEPENDENT_AMBULATORY_CARE_PROVIDER_SITE_OTHER): Payer: BLUE CROSS/BLUE SHIELD | Admitting: Physician Assistant

## 2014-11-04 ENCOUNTER — Encounter: Payer: Self-pay | Admitting: Physician Assistant

## 2014-11-04 VITALS — BP 135/85 | HR 96 | Temp 97.6°F | Wt 189.0 lb

## 2014-11-04 DIAGNOSIS — J069 Acute upper respiratory infection, unspecified: Secondary | ICD-10-CM | POA: Diagnosis not present

## 2014-11-04 DIAGNOSIS — N184 Chronic kidney disease, stage 4 (severe): Secondary | ICD-10-CM | POA: Insufficient documentation

## 2014-11-04 MED ORDER — AZITHROMYCIN 250 MG PO TABS: ORAL_TABLET | ORAL | Status: DC

## 2014-11-04 MED ORDER — HYDROCODONE-HOMATROPINE 5-1.5 MG/5ML PO SYRP: 5.0000 mL | ORAL_SOLUTION | Freq: Every evening | ORAL | Status: DC | PRN

## 2014-11-04 NOTE — Progress Notes (Signed)
   Subjective:    Patient ID: Mariah [name], female    DOB: 09-15-1956, 59 y.o.   MRN: [id number]  HPI Pt is a 59 yo female who presents to the clinic with 2 weeks of ST and congestion that have progressed into cough and wheezing with sinus pressure. She does have stage 4 renal disease. Partner has also been sick with bronchitis for 1 week. She has tried mucinex and delsym with no relief. No fever. Cough is productive.    Review of Systems  All other systems reviewed and are negative.      Objective:   Physical Exam  Constitutional: She is oriented to person, place, and time. She appears well-developed and well-nourished.  HENT:  Head: Normocephalic and atraumatic.  Right Ear: External ear normal.  Left Ear: External ear normal.  Mouth/Throat: Oropharynx is clear and moist.  TM's clear bilaterally.   Pressure to palpation over bilateral maxillary sinuses.   Bilateral nasal turbinates red and swollen.   Eyes: Conjunctivae are normal. Right eye exhibits no discharge. Left eye exhibits no discharge.  Neck: Normal range of motion. Neck supple.  Cardiovascular: Normal rate, regular rhythm and normal heart sounds.   Pulmonary/Chest: Effort normal and breath sounds normal. She has no wheezes.  Lymphadenopathy:    She has no cervical adenopathy.  Neurological: She is alert and oriented to person, place, and time.  Psychiatric: She has a normal mood and affect. Her behavior is normal.          Assessment & Plan:  Acute upper respiratory infection- given zpak today. Hycodan for cough at bedtime. Rest and hydration. Discussed use albuterol inhaler at home as needed up to every 2 hours for next couple of days.

## 2014-11-08 ENCOUNTER — Encounter: Payer: Self-pay | Admitting: Sports Medicine

## 2014-11-08 ENCOUNTER — Ambulatory Visit (INDEPENDENT_AMBULATORY_CARE_PROVIDER_SITE_OTHER): Payer: BLUE CROSS/BLUE SHIELD | Admitting: Sports Medicine

## 2014-11-08 DIAGNOSIS — M1711 Unilateral primary osteoarthritis, right knee: Secondary | ICD-10-CM

## 2014-11-08 MED ORDER — DICLOFENAC SODIUM 2 % TD SOLN: 2.0000 | Freq: Two times a day (BID) | TRANSDERMAL | Status: DC

## 2014-11-08 NOTE — Assessment & Plan Note (Signed)
Overall did well with approximately 60-70% relief of pain after aspiration and injection last visit. 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 Overall doing okay, we are going to add topical Pennsaid samples, and she can return as needed, next step would be Visco supplementation. We would need to get her approved for [facility] for an appointment.

## 2014-11-08 NOTE — Progress Notes (Signed)
  Subjective:    CC: follow-up  HPI: Right knee pain: Aspiration and injection at the last visit, has done very well, 60-70% improvement in pain, she did request that we do an MRI the results of which will be dictated below, pain is moderate, persistent, localized at the anterior knee. No mechanical symptoms.  Past medical history, Surgical history, Family history not pertinant except as noted below, Social history, Allergies, and medications have been entered into the medical record, reviewed, and no changes needed.   Review of Systems: No fevers, chills, night sweats, weight loss, chest pain, or shortness of breath.   Objective:    General: Well Developed, well nourished, and in no acute distress.  Neuro: Alert and oriented x3, extra-ocular muscles intact, sensation grossly intact.  HEENT: Normocephalic, atraumatic, pupils equal round reactive to light, neck supple, no masses, no lymphadenopathy, thyroid nonpalpable.  Skin: Warm and dry, no rashes. Cardiac: Regular rate and rhythm, no murmurs rubs or gallops, no lower extremity edema.  Respiratory: Clear to auscultation bilaterally. Not using accessory muscles, speaking in full sentences. Right Knee: Normal to inspection with no erythema or effusion or obvious bony abnormalities. Palpation normal with no warmth or joint line tenderness or patellar tenderness or condyle tenderness. ROM normal in flexion and extension and lower leg rotation. Ligaments with solid consistent endpoints including ACL, PCL, LCL, MCL. Negative Mcmurray's and provocative meniscal tests. Non painful patellar compression. Patellar and quadriceps tendons unremarkable. Hamstring and quadriceps strength is normal.  MRI reviewed and shows moderate chondromalacia patellae particularly along the lateral patellar facet and lateral femoral trochlear groove, there is mild DJD in the lateral compartment and none in the medial compartment. No meniscal tears.  Impression  and Recommendations:

## 2014-11-19 NOTE — Progress Notes (Signed)
HPI: FU hypertension and atrial fibrillation. Patient previously followed in [place]. She apparently had a monitor previously that showed short episodes of atrial fibrillation for up to 1 minute. Patient had a stress Myoview in July 2011 that was normal. Her ejection fraction was 67%. Cardiac monitor repeated in April of 2014 because of palpitations. This revealed sinus rhythm. Patient was admitted to [facility] in September 2015 with acute renal failure. She does have polycystic kidney disease. She is now followed by nephrology. Echocardiogram October 2015 showed normal LV function, grade 1 diastolic dysfunction, mild left atrial enlargement, moderate or 2+ mitral regurgitation. Since she was last seen, at present she denies dyspnea, chest pain, palpitations or syncope. No pedal edema.  Current Outpatient Prescriptions  Medication Sig Dispense Refill  . AMBULATORY NON FORMULARY MEDICATION Knee high compression stocking for bilateral leg edema.  [id number] 1 application 0  . amLODipine-atorvastatin ([name]) 10-80 MG per tablet TAKE ONE TABLET BY MOUTH ONE TIME DAILY 30 tablet 5  . colchicine (COLCRYS) 0.6 MG tablet Take 1 tablet (0.6 mg total) by mouth daily. 30 tablet 5  . Diclofenac Sodium 2 % SOLN Place 2 sprays onto the skin 2 (two) times daily. 1 Bottle 11  . famotidine (PEPCID) 20 MG tablet Take 20 mg by mouth daily.    . febuxostat (ULORIC) 40 MG tablet Take 1 tablet (40 mg total) by mouth daily. 30 tablet 1  . furosemide (LASIX) 20 MG tablet Take 4 tablets (80 mg total) by mouth daily. (Patient taking differently: Take 80 mg by mouth daily. Take 80 mg in the morning and 40 mg in the afternoon.) 30 tablet 0  . magnesium oxide (MAG-OX) 400 MG tablet Take 2 tablets (800 mg total) by mouth at bedtime. (Patient taking differently: Take 400 mg by mouth at bedtime. ) 90 tablet 3  . metoprolol succinate (TOPROL-XL) 25 MG 24 hr tablet 75mg  once daily. 30 tablet 0  . Multiple  Vitamin (MULTIVITAMIN) tablet Take 1 tablet by mouth daily. Centrum Silver    . Probiotic Product (PHILLIPS COLON HEALTH PO) Take 1 capsule by mouth daily.    . valsartan (DIOVAN) 160 MG tablet Take 160 mg by mouth daily.    [name] Kitchen venlafaxine XR (EFFEXOR XR) 37.5 MG 24 hr capsule Take 2 tablets daily 120 capsule 3   No current facility-administered medications for this visit.     Past Medical History  Diagnosis Date  . Hypertension   . Arthritis   . Atrial fibrillation 2012  . Hyperlipidemia   . Polycystic kidney disease   . SDH (subdural hematoma)   . Gout     Past Surgical History  Procedure Laterality Date  . Radial head implant  09/24/2011    Procedure: RADIAL HEAD IMPLANT;  Surgeon: Schuyler Amor, MD;  Location: [place];  Service: Orthopedics;  Laterality: Left;  left radial head replacement  . Orif elbow fracture  10/22/2011    Procedure: OPEN REDUCTION INTERNAL FIXATION (ORIF) ELBOW/OLECRANON FRACTURE;  Surgeon: Schuyler Amor, MD;  Location: [place];  Service: Orthopedics;  Laterality: Left;  open reduction  possible lateral reconstruction with palmaris graft from left or right side.  . Evacuation of subdural hematoma  11/26/2000  . Tonsillectomy    . Colonoscopy  03/2004    repeat 10 years    History   Social History  . Marital Status: Married    Spouse Name: N/A  . Number of Children: 0  .  Years of Education: N/A   Occupational History  . OWNER    Social History Main Topics  . Smoking status: Former Smoker -- 2.00 packs/day for 24 years    Types: Cigarettes    Quit date: 12/12/1996  . Smokeless tobacco: Never Used  . Alcohol Use: 1.2 oz/week    2 Glasses of wine per week     Comment: 2-3 glasses wine per day  . Drug Use: No  . Sexual Activity:    Partners: Female   Other Topics Concern  . Not on file   Social History Narrative    ROS: no fevers or chills, productive cough, hemoptysis, dysphasia, odynophagia,  melena, hematochezia, dysuria, hematuria, rash, seizure activity, orthopnea, PND, pedal edema, claudication. Remaining systems are negative.  Physical Exam: Well-developed well-nourished in no acute distress.  Skin is warm and dry.  HEENT is normal.  Neck is supple.  Chest is clear to auscultation with normal expansion.  Cardiovascular exam is regular rate and rhythm.  Abdominal exam nontender or distended. No masses palpated. Extremities show no edema. neuro grossly intact

## 2014-11-20 ENCOUNTER — Ambulatory Visit (INDEPENDENT_AMBULATORY_CARE_PROVIDER_SITE_OTHER): Payer: BLUE CROSS/BLUE SHIELD | Admitting: Cardiology

## 2014-11-20 ENCOUNTER — Encounter: Payer: Self-pay | Admitting: Cardiology

## 2014-11-20 VITALS — BP 112/78 | HR 77 | Ht 62.5 in | Wt 188.4 lb

## 2014-11-20 DIAGNOSIS — I10 Essential (primary) hypertension: Secondary | ICD-10-CM

## 2014-11-20 DIAGNOSIS — I340 Nonrheumatic mitral (valve) insufficiency: Secondary | ICD-10-CM | POA: Insufficient documentation

## 2014-11-20 DIAGNOSIS — N183 Chronic kidney disease, stage 3 unspecified: Secondary | ICD-10-CM

## 2014-11-20 DIAGNOSIS — I480 Paroxysmal atrial fibrillation: Secondary | ICD-10-CM

## 2014-11-20 DIAGNOSIS — R002 Palpitations: Secondary | ICD-10-CM

## 2014-11-20 NOTE — Assessment & Plan Note (Signed)
Plan follow-up echoes in the future.

## 2014-11-20 NOTE — Patient Instructions (Signed)
Your physician wants you to follow-up in: 6 MONTHS WITH DR CRENSHAW You will receive a reminder letter in the mail two months in advance. If you don't receive a letter, please call our office to schedule the follow-up appointment.  

## 2014-11-20 NOTE — Assessment & Plan Note (Signed)
Blood pressure controlled. Continue present medications. 

## 2014-11-20 NOTE — Assessment & Plan Note (Signed)
Continue statin. 

## 2014-11-20 NOTE — Assessment & Plan Note (Signed)
Continue beta blocker. 

## 2014-11-20 NOTE — Assessment & Plan Note (Signed)
Patient apparently only had one brief less than 1 minute episode previously. I do not think this requires long-term anticoagulation. She occasionally has brief skips but no sustained palpitations. If she has more prolonged episodes in the future I would want to proceed with a monitor. If atrial fibrillation documented she would need anticoagulation. Continue beta blocker.

## 2014-11-20 NOTE — Assessment & Plan Note (Signed)
Followed by nephrology. 

## 2014-12-24 ENCOUNTER — Other Ambulatory Visit: Payer: Self-pay | Admitting: Physician Assistant

## 2015-01-22 ENCOUNTER — Other Ambulatory Visit: Payer: Self-pay | Admitting: Physician Assistant

## 2015-02-14 LAB — URIC ACID: Uric Acid: 6.9

## 2015-02-14 LAB — BASIC METABOLIC PANEL
CREATININE: 2.7 mg/dL — AB (ref 0.5–1.1)
Glucose: 96 mg/dL
POTASSIUM: 4.8 mmol/L (ref 3.4–5.3)
SODIUM: 135 mmol/L — AB (ref 137–147)

## 2015-02-14 LAB — CHLORIDE
Calcium: 9.6 mg/dL
Chloride: 99 mmol/L
PHOSPHORUS: 4.3

## 2015-02-14 LAB — CBC AND DIFFERENTIAL: HEMOGLOBIN: 11.9 g/dL — AB (ref 12.0–16.0)

## 2015-02-14 LAB — CALCIUM: CALCIUM: 9.6 mg/dL

## 2015-02-14 LAB — ESTIMATED GFR
EGFR (African American): 22
EGFR (Non-African Amer.): 19

## 2015-02-14 LAB — PROTEIN / CREATININE RATIO, URINE: PROTEIN CREATININE RATIO: 444

## 2015-02-14 LAB — PROTEIN C, TOTAL: TOTAL PROTEIN: 24.4 g/dL

## 2015-02-14 LAB — ALBUMIN: Albumin: 4.7

## 2015-02-14 LAB — PHOSPHORUS: PHOSPHORUS: 4.3

## 2015-02-21 ENCOUNTER — Other Ambulatory Visit: Payer: Self-pay | Admitting: Physician Assistant

## 2015-02-23 ENCOUNTER — Encounter: Payer: Self-pay | Admitting: Physician Assistant

## 2015-02-24 ENCOUNTER — Other Ambulatory Visit: Payer: Self-pay | Admitting: *Deleted

## 2015-02-24 MED ORDER — AMLODIPINE-ATORVASTATIN 10-80 MG PO TABS: ORAL_TABLET | ORAL | Status: DC

## 2015-02-25 ENCOUNTER — Encounter: Payer: Self-pay | Admitting: Physician Assistant

## 2015-03-24 ENCOUNTER — Other Ambulatory Visit: Payer: Self-pay | Admitting: Nurse Practitioner

## 2015-03-24 NOTE — Telephone Encounter (Signed)
Medication refill request: Flexeril  Last AEX:  08-29-14  Next AEX: 08-30-15 Last MMG (if hormonal medication request): 09-12-14 WNL Refill authorized: please advise

## 2015-05-16 LAB — BASIC METABOLIC PANEL
BUN: 74 mg/dL — AB (ref 4–21)
CREATININE: 2.7 mg/dL — AB (ref 0.5–1.1)
Glucose: 93 mg/dL
Potassium: 5.1 mmol/L (ref 3.4–5.3)
SODIUM: 136 mmol/L — AB (ref 137–147)

## 2015-05-16 LAB — CBC AND DIFFERENTIAL: Hemoglobin: 11 g/dL — AB (ref 12.0–16.0)

## 2015-06-02 ENCOUNTER — Ambulatory Visit (INDEPENDENT_AMBULATORY_CARE_PROVIDER_SITE_OTHER): Payer: BLUE CROSS/BLUE SHIELD | Admitting: Physician Assistant

## 2015-06-02 DIAGNOSIS — Z23 Encounter for immunization: Secondary | ICD-10-CM | POA: Diagnosis not present

## 2015-06-02 NOTE — Progress Notes (Signed)
   Subjective:    Patient ID: Mariah [name], female    DOB: 1956/03/13, 59 y.o.   MRN: [id number] Flu shot given today during her partner's visit.  Given LD with no complications.   Beatris Ship, CMA HPI    Review of Systems     Objective:   Physical Exam        Assessment & Plan:

## 2015-06-11 ENCOUNTER — Ambulatory Visit: Payer: BLUE CROSS/BLUE SHIELD | Admitting: Cardiology

## 2015-06-13 ENCOUNTER — Emergency Department (HOSPITAL_COMMUNITY)
Admission: EM | Admit: 2015-06-13 | Discharge: 2015-06-13 | Disposition: A | Discharge status: Home / Self Care | Payer: BLUE CROSS/BLUE SHIELD | Attending: Emergency Medicine | Admitting: Emergency Medicine

## 2015-06-13 ENCOUNTER — Emergency Department (HOSPITAL_COMMUNITY): Payer: BLUE CROSS/BLUE SHIELD

## 2015-06-13 ENCOUNTER — Encounter (HOSPITAL_COMMUNITY): Payer: Self-pay | Admitting: Family Medicine

## 2015-06-13 DIAGNOSIS — R1011 Right upper quadrant pain: Secondary | ICD-10-CM | POA: Diagnosis present

## 2015-06-13 DIAGNOSIS — Z79899 Other long term (current) drug therapy: Secondary | ICD-10-CM | POA: Insufficient documentation

## 2015-06-13 DIAGNOSIS — Z8739 Personal history of other diseases of the musculoskeletal system and connective tissue: Secondary | ICD-10-CM | POA: Insufficient documentation

## 2015-06-13 DIAGNOSIS — Z8639 Personal history of other endocrine, nutritional and metabolic disease: Secondary | ICD-10-CM | POA: Diagnosis not present

## 2015-06-13 DIAGNOSIS — Z87891 Personal history of nicotine dependence: Secondary | ICD-10-CM | POA: Insufficient documentation

## 2015-06-13 DIAGNOSIS — Q613 Polycystic kidney, unspecified: Secondary | ICD-10-CM

## 2015-06-13 DIAGNOSIS — I10 Essential (primary) hypertension: Secondary | ICD-10-CM | POA: Diagnosis not present

## 2015-06-13 DIAGNOSIS — R109 Unspecified abdominal pain: Secondary | ICD-10-CM

## 2015-06-13 LAB — URINALYSIS, ROUTINE W REFLEX MICROSCOPIC
Bilirubin Urine: NEGATIVE
GLUCOSE, UA: NEGATIVE mg/dL
HGB URINE DIPSTICK: NEGATIVE
KETONES UR: NEGATIVE mg/dL
LEUKOCYTES UA: NEGATIVE
Nitrite: NEGATIVE
PROTEIN: NEGATIVE mg/dL
Specific Gravity, Urine: 1.009 (ref 1.005–1.030)
UROBILINOGEN UA: 0.2 mg/dL (ref 0.0–1.0)
pH: 5 (ref 5.0–8.0)

## 2015-06-13 LAB — LIPASE, BLOOD: LIPASE: 32 U/L (ref 22–51)

## 2015-06-13 LAB — CBC
HCT: 31.5 % — ABNORMAL LOW (ref 36.0–46.0)
Hemoglobin: 10.6 g/dL — ABNORMAL LOW (ref 12.0–15.0)
MCH: 28.8 pg (ref 26.0–34.0)
MCHC: 33.7 g/dL (ref 30.0–36.0)
MCV: 85.6 fL (ref 78.0–100.0)
PLATELETS: 239 10*3/uL (ref 150–400)
RBC: 3.68 MIL/uL — AB (ref 3.87–5.11)
RDW: 12.4 % (ref 11.5–15.5)
WBC: 11.2 10*3/uL — ABNORMAL HIGH (ref 4.0–10.5)

## 2015-06-13 LAB — COMPREHENSIVE METABOLIC PANEL
ALBUMIN: 4.2 g/dL (ref 3.5–5.0)
ALK PHOS: 167 U/L — AB (ref 38–126)
ALT: 24 U/L (ref 14–54)
AST: 29 U/L (ref 15–41)
Anion gap: 13 (ref 5–15)
BILIRUBIN TOTAL: 0.4 mg/dL (ref 0.3–1.2)
BUN: 82 mg/dL — ABNORMAL HIGH (ref 6–20)
CALCIUM: 9 mg/dL (ref 8.9–10.3)
CO2: 19 mmol/L — AB (ref 22–32)
CREATININE: 2.91 mg/dL — AB (ref 0.44–1.00)
Chloride: 100 mmol/L — ABNORMAL LOW (ref 101–111)
GFR calc non Af Amer: 16 mL/min — ABNORMAL LOW (ref >60)
GFR, EST AFRICAN AMERICAN: 19 mL/min — AB (ref >60)
Glucose, Bld: 124 mg/dL — ABNORMAL HIGH (ref 65–99)
Potassium: 4.4 mmol/L (ref 3.5–5.1)
Sodium: 132 mmol/L — ABNORMAL LOW (ref 135–145)
TOTAL PROTEIN: 7 g/dL (ref 6.5–8.1)

## 2015-06-13 MED ORDER — ONDANSETRON HCL 4 MG/2ML IJ SOLN
4.0000 mg | Freq: Once | INTRAMUSCULAR | Status: AC
  Administered 2015-06-13: 4 mg via INTRAVENOUS
  Filled 2015-06-13: qty 2

## 2015-06-13 MED ORDER — ONDANSETRON HCL 4 MG PO TABS: 4.0000 mg | ORAL_TABLET | Freq: Four times a day (QID) | ORAL | Status: DC

## 2015-06-13 MED ORDER — DIPHENHYDRAMINE HCL 50 MG/ML IJ SOLN
25.0000 mg | Freq: Once | INTRAMUSCULAR | Status: AC
  Administered 2015-06-13: 25 mg via INTRAVENOUS
  Filled 2015-06-13: qty 1

## 2015-06-13 MED ORDER — OXYCODONE-ACETAMINOPHEN 5-325 MG PO TABS: 1.0000 | ORAL_TABLET | ORAL | Status: DC | PRN

## 2015-06-13 MED ORDER — OXYCODONE-ACETAMINOPHEN 5-325 MG PO TABS
1.0000 | ORAL_TABLET | Freq: Once | ORAL | Status: AC
  Administered 2015-06-13: 1 via ORAL
  Filled 2015-06-13: qty 1

## 2015-06-13 MED ORDER — MORPHINE SULFATE (PF) 2 MG/ML IV SOLN
2.0000 mg | Freq: Once | INTRAVENOUS | Status: AC
  Administered 2015-06-13: 2 mg via INTRAVENOUS
  Filled 2015-06-13: qty 1

## 2015-06-13 NOTE — ED Provider Notes (Signed)
CSN: [id number]     Arrival date & time 06/13/15  1415 History   First MD Initiated Contact with Patient 06/13/15 1428     Chief Complaint  Patient presents with  . Abdominal Pain  . Flank Pain   HPI  Mariah [name] is a 59 year old female with PMHx of polycystic kidney disease and a fib not on anticoagulants presenting with  RUQ pain and nausea. Pain started this morning and is described as sharp, constant and radiates through to her back. Associated symptoms include nausea. Pt had an infected kidney cyst last year and is concerned that it is happening again. Pt also with history of kidney stones. Denies fevers, chills, chest pain, SOB, vomiting, change in urination or diarrhea. Pt has not tried anything for pain control.   Past Medical History  Diagnosis Date  . Hypertension   . Arthritis   . Atrial fibrillation 2012  . Hyperlipidemia   . Polycystic kidney disease   . SDH (subdural hematoma)   . Gout    Past Surgical History  Procedure Laterality Date  . Radial head implant  09/24/2011    Procedure: RADIAL HEAD IMPLANT;  Surgeon: Schuyler Amor, MD;  Location: [place];  Service: Orthopedics;  Laterality: Left;  left radial head replacement  . Orif elbow fracture  10/22/2011    Procedure: OPEN REDUCTION INTERNAL FIXATION (ORIF) ELBOW/OLECRANON FRACTURE;  Surgeon: Schuyler Amor, MD;  Location: [place];  Service: Orthopedics;  Laterality: Left;  open reduction  possible lateral reconstruction with palmaris graft from left or right side.  . Evacuation of subdural hematoma  11/26/2000  . Tonsillectomy    . Colonoscopy  03/2004    repeat 10 years   Family History  Problem Relation Age of Onset  . Polycystic kidney disease Father   . Heart failure Father   . Heart disease Mother     Rheumatic fever   Social History  Substance Use Topics  . Smoking status: Former Smoker -- 2.00 packs/day for 24 years    Types: Cigarettes    Quit date:  12/12/1996  . Smokeless tobacco: Never Used  . Alcohol Use: 1.2 oz/week    2 Glasses of wine per week     Comment: 2-3 glasses wine per day   OB History    Gravida Para Term Preterm AB TAB SAB Ectopic Multiple Living   0 0             Review of Systems  Constitutional: Negative for fever and chills.  Respiratory: Negative for shortness of breath.   Cardiovascular: Negative for chest pain.  Gastrointestinal: Positive for nausea and abdominal pain. Negative for vomiting, diarrhea and abdominal distention.  Genitourinary: Positive for flank pain. Negative for dysuria, frequency and hematuria.  Neurological: Negative for headaches.      Allergies  Codeine; Morphine; and Zovirax  Home Medications   Prior to Admission medications   Medication Sig Start Date End Date Taking? Authorizing Provider  AMBULATORY NON FORMULARY MEDICATION Knee high compression stocking for bilateral leg edema.  35-15mmHg 09/10/14   [name] L Breeback, PA-C  amLODipine-atorvastatin ([name]) 10-80 MG per tablet TAKE ONE TABLET BY MOUTH ONCE DAILY 02/24/15   [name] L Breeback, PA-C  colchicine (COLCRYS) 0.6 MG tablet Take 1 tablet (0.6 mg total) by mouth daily. 10/10/14   [name] L Breeback, PA-C  cyclobenzaprine (FLEXERIL) 10 MG tablet TAKE ONE TABLET BY MOUTH THREE TIMES DAILY AS NEEDED FOR MUSCLE SPASMS 03/24/15  Kem Boroughs, FNP  Diclofenac Sodium 2 % SOLN Place 2 sprays onto the skin 2 (two) times daily. 11/08/14   Silverio Decamp, MD  famotidine (PEPCID) 20 MG tablet Take 20 mg by mouth daily.    Historical Provider, MD  febuxostat (ULORIC) 40 MG tablet Take 1 tablet (40 mg total) by mouth daily. 09/02/14   [name] L Breeback, PA-C  furosemide (LASIX) 20 MG tablet Take 4 tablets (80 mg total) by mouth daily. Patient taking differently: Take 80 mg by mouth daily. Take 80 mg in the morning and 40 mg in the afternoon. 09/18/14   [name] L Breeback, PA-C  magnesium oxide (MAG-OX) 400 MG tablet Take 2 tablets (800 mg  total) by mouth at bedtime. Patient taking differently: Take 400 mg by mouth at bedtime.  08/20/14   Silverio Decamp, MD  metoprolol succinate (TOPROL-XL) 25 MG 24 hr tablet 75mg  once daily. 09/18/14   [name] L Breeback, PA-C  Multiple Vitamin (MULTIVITAMIN) tablet Take 1 tablet by mouth daily. Centrum Silver    Historical Provider, MD  ondansetron (ZOFRAN) 4 MG tablet Take 1 tablet (4 mg total) by mouth every 6 (six) hours. 06/13/15   Iliya Spivack, PA-C  oxyCODONE-acetaminophen (PERCOCET/ROXICET) 5-325 MG per tablet Take 1-2 tablets by mouth every 4 (four) hours as needed for severe pain. 06/13/15   Braylynn Ghan, PA-C  Probiotic Product (PHILLIPS COLON HEALTH PO) Take 1 capsule by mouth daily.    Historical Provider, MD  valsartan (DIOVAN) 160 MG tablet Take 160 mg by mouth daily.    Historical Provider, MD  venlafaxine XR (EFFEXOR XR) 37.5 MG 24 hr capsule Take 2 tablets daily 08/29/14   Kem Boroughs, FNP   BP 121/64 mmHg  Pulse 106  Temp(Src) 98.4 F (36.9 C) (Oral)  Resp 18  Ht 5\' 2"  (1.575 m)  Wt 180 lb (81.647 kg)  BMI 32.91 kg/m2  SpO2 97%  LMP 07/11/2009 (Approximate) Physical Exam  Constitutional: She is oriented to person, place, and time. She appears well-developed and well-nourished. No distress.  HENT:  Head: Normocephalic and atraumatic.  Eyes: Conjunctivae and EOM are normal. Pupils are equal, round, and reactive to light.  Neck: Normal range of motion.  Cardiovascular: Normal rate, regular rhythm and normal heart sounds.   Pulmonary/Chest: Effort normal and breath sounds normal. No respiratory distress. She has no wheezes. She has no rales.  Abdominal: Soft. She exhibits no distension and no mass. There is tenderness (RUQ). There is no rebound and no guarding.  No redness or bruising over abdomen or flank. Positive CVA tenderness  Musculoskeletal: Normal range of motion.  Neurological: She is alert and oriented to person, place, and time.  Skin: Skin is warm and  dry.  Psychiatric: She has a normal mood and affect. Her behavior is normal.  Nursing note and vitals reviewed.   ED Course  Procedures (including critical care time) Labs Review Labs Reviewed  COMPREHENSIVE METABOLIC PANEL - Abnormal; Notable for the following:    Sodium 132 (*)    Chloride 100 (*)    CO2 19 (*)    Glucose, Bld 124 (*)    BUN 82 (*)    Creatinine, Ser 2.91 (*)    Alkaline Phosphatase 167 (*)    GFR calc non Af Amer 16 (*)    GFR calc Af Amer 19 (*)    All other components within normal limits  CBC - Abnormal; Notable for the following:    WBC 11.2 (*)  RBC 3.68 (*)    Hemoglobin 10.6 (*)    HCT 31.5 (*)    All other components within normal limits  LIPASE, BLOOD  URINALYSIS, ROUTINE W REFLEX MICROSCOPIC (NOT AT [facility])    Imaging Review Ct Renal Stone Study  06/13/2015   CLINICAL DATA:  RIGHT upper quadrant and RIGHT flank pain, nausea, history polycystic kidney disease ; patient has says symptoms feel similar to prior episode of infected renal cyst ; past history hypertension, atrial fibrillation, former smoker  EXAM: CT ABDOMEN AND PELVIS WITHOUT CONTRAST  TECHNIQUE: Multidetector CT imaging of the abdomen and pelvis was performed following the standard protocol without IV contrast. Sagittal and coronal MPR images reconstructed from axial data set.  COMPARISON:  None  FINDINGS: Minimal bibasilar atelectasis.  Innumerable cysts throughout both kidneys compatible with polycystic kidney disease.  Some of the observed cysts are intermediate and high in attenuation bilaterally.  No definite ureteral calcification or hydroureteronephrosis.  Bladder decompressed and ureters unremarkable.  Numerous hepatic cysts largest 8.1 x 7.0 cm image 15.  No significant abnormalities of the spleen, pancreas, or adrenal glands.  Stomach and small bowel loops unremarkable.  Normal appendix.  Sigmoid diverticulosis.  Stomach and bowel loops otherwise normal.  Scattered atherosclerotic  calcification.  No mass, adenopathy, free air or free fluid.  Atrophic uterus with tiny anterior leiomyoma.  Adnexa unremarkable.  Mild degenerative disc disease changes L5-S1.  IMPRESSION: Numerous cysts throughout the kidneys compatible apply cystic kidney disease.  Some of the observed nodules are intermediate to high in attenuation compatible likely representing complicated cysts with prior hemorrhage, though solid nodules are not excluded ; these could be evaluated by followup MR imaging to characterize.  Extensive hepatic cystic disease.  Sigmoid diverticulosis.  No definite acute intra-abdominal or intrapelvic abnormalities.   Electronically Signed   By: Lavonia Dana M.D.   On: 06/13/2015 16:58   I have personally reviewed and evaluated these images and lab results as part of my medical decision-making.   EKG Interpretation None      MDM   Final diagnoses:  Flank pain  Polycystic kidney disease   Pt with PMHx of ploycystic kidney disease presenting with 1 day history of RUQ pain and nausea. Pain is severe, sharp and radiates through to the back. VSS. Afebrile and pt nontoxic. Abdomen tender in RUQ with CVA tenderness to right. No distension or color changes over abdomen. UA normal. Abd CT showing high attenuation cysts that likely represent hemorrhage. Likely cause of pain. No signs of infection or acute intra-abdominal disease processes. Pain well controlled with morphine and zofran in ED. Will discharge pt with percocet and zofran. Pt will need to call her nephrologist for follow up. Return to ED with fevers, chills, abdominal swelling, increased abdominale pain, changes in urination or further worsening of symptoms. Pt agrees with this plan.      Lahoma Crocker Fronia Depass, PA-C 06/13/15 2232  Lajean Saver, MD 06/16/15 1010

## 2015-06-13 NOTE — ED Notes (Signed)
Pt returned from CT °

## 2015-06-13 NOTE — ED Notes (Signed)
Pt here for RUQ pain, flank pain that started this am. sts hx of same with kidney problems.

## 2015-06-13 NOTE — ED Notes (Signed)
Pt off unit with CT 

## 2015-06-13 NOTE — Discharge Instructions (Signed)
-   Use percocet as needed for pain control - Zofran for nausea control - Call your nephrologist to schedule a follow up appointment - Return to ED with fevers, chills, abdominal swelling, change in urination, passing out or further worsening of symptoms

## 2015-06-15 ENCOUNTER — Emergency Department (HOSPITAL_COMMUNITY): Payer: BLUE CROSS/BLUE SHIELD

## 2015-06-15 ENCOUNTER — Inpatient Hospital Stay (HOSPITAL_COMMUNITY)
Admission: EM | Admit: 2015-06-15 | Discharge: 2015-06-17 | DRG: 699 | Disposition: A | Discharge status: Home / Self Care | Payer: BLUE CROSS/BLUE SHIELD | Attending: Internal Medicine | Admitting: Internal Medicine

## 2015-06-15 ENCOUNTER — Encounter (HOSPITAL_COMMUNITY): Payer: Self-pay | Admitting: *Deleted

## 2015-06-15 DIAGNOSIS — R109 Unspecified abdominal pain: Secondary | ICD-10-CM | POA: Diagnosis present

## 2015-06-15 DIAGNOSIS — Z87891 Personal history of nicotine dependence: Secondary | ICD-10-CM

## 2015-06-15 DIAGNOSIS — D631 Anemia in chronic kidney disease: Secondary | ICD-10-CM | POA: Diagnosis present

## 2015-06-15 DIAGNOSIS — R1011 Right upper quadrant pain: Secondary | ICD-10-CM | POA: Diagnosis not present

## 2015-06-15 DIAGNOSIS — I10 Essential (primary) hypertension: Secondary | ICD-10-CM | POA: Diagnosis present

## 2015-06-15 DIAGNOSIS — Q613 Polycystic kidney, unspecified: Secondary | ICD-10-CM

## 2015-06-15 DIAGNOSIS — E872 Acidosis: Secondary | ICD-10-CM | POA: Diagnosis present

## 2015-06-15 DIAGNOSIS — N179 Acute kidney failure, unspecified: Secondary | ICD-10-CM | POA: Diagnosis present

## 2015-06-15 DIAGNOSIS — Z79891 Long term (current) use of opiate analgesic: Secondary | ICD-10-CM

## 2015-06-15 DIAGNOSIS — I129 Hypertensive chronic kidney disease with stage 1 through stage 4 chronic kidney disease, or unspecified chronic kidney disease: Secondary | ICD-10-CM | POA: Diagnosis present

## 2015-06-15 DIAGNOSIS — Z888 Allergy status to other drugs, medicaments and biological substances status: Secondary | ICD-10-CM

## 2015-06-15 DIAGNOSIS — N170 Acute kidney failure with tubular necrosis: Secondary | ICD-10-CM | POA: Diagnosis present

## 2015-06-15 DIAGNOSIS — M1990 Unspecified osteoarthritis, unspecified site: Secondary | ICD-10-CM | POA: Diagnosis present

## 2015-06-15 DIAGNOSIS — R509 Fever, unspecified: Secondary | ICD-10-CM | POA: Diagnosis present

## 2015-06-15 DIAGNOSIS — Z79899 Other long term (current) drug therapy: Secondary | ICD-10-CM

## 2015-06-15 DIAGNOSIS — E785 Hyperlipidemia, unspecified: Secondary | ICD-10-CM | POA: Diagnosis present

## 2015-06-15 DIAGNOSIS — N183 Chronic kidney disease, stage 3 (moderate): Secondary | ICD-10-CM | POA: Diagnosis present

## 2015-06-15 DIAGNOSIS — Z885 Allergy status to narcotic agent status: Secondary | ICD-10-CM

## 2015-06-15 DIAGNOSIS — M109 Gout, unspecified: Secondary | ICD-10-CM | POA: Diagnosis present

## 2015-06-15 LAB — COMPREHENSIVE METABOLIC PANEL
ALBUMIN: 3.6 g/dL (ref 3.5–5.0)
ALT: 35 U/L (ref 14–54)
ANION GAP: 13 (ref 5–15)
AST: 33 U/L (ref 15–41)
Alkaline Phosphatase: 207 U/L — ABNORMAL HIGH (ref 38–126)
BILIRUBIN TOTAL: 0.7 mg/dL (ref 0.3–1.2)
BUN: 92 mg/dL — AB (ref 6–20)
CO2: 20 mmol/L — AB (ref 22–32)
Calcium: 8.8 mg/dL — ABNORMAL LOW (ref 8.9–10.3)
Chloride: 97 mmol/L — ABNORMAL LOW (ref 101–111)
Creatinine, Ser: 4.67 mg/dL — ABNORMAL HIGH (ref 0.44–1.00)
GFR calc Af Amer: 11 mL/min — ABNORMAL LOW (ref >60)
GFR calc non Af Amer: 9 mL/min — ABNORMAL LOW (ref >60)
GLUCOSE: 101 mg/dL — AB (ref 65–99)
POTASSIUM: 4.5 mmol/L (ref 3.5–5.1)
SODIUM: 130 mmol/L — AB (ref 135–145)
TOTAL PROTEIN: 6.8 g/dL (ref 6.5–8.1)

## 2015-06-15 LAB — CBC
HEMATOCRIT: 30 % — AB (ref 36.0–46.0)
HEMOGLOBIN: 9.7 g/dL — AB (ref 12.0–15.0)
MCH: 28.3 pg (ref 26.0–34.0)
MCHC: 32.3 g/dL (ref 30.0–36.0)
MCV: 87.5 fL (ref 78.0–100.0)
Platelets: 231 10*3/uL (ref 150–400)
RBC: 3.43 MIL/uL — ABNORMAL LOW (ref 3.87–5.11)
RDW: 13 % (ref 11.5–15.5)
WBC: 8.7 10*3/uL (ref 4.0–10.5)

## 2015-06-15 LAB — URINALYSIS, ROUTINE W REFLEX MICROSCOPIC
BILIRUBIN URINE: NEGATIVE
GLUCOSE, UA: NEGATIVE mg/dL
HGB URINE DIPSTICK: NEGATIVE
Ketones, ur: NEGATIVE mg/dL
Leukocytes, UA: NEGATIVE
Nitrite: NEGATIVE
PH: 5 (ref 5.0–8.0)
Protein, ur: NEGATIVE mg/dL
SPECIFIC GRAVITY, URINE: 1.009 (ref 1.005–1.030)
UROBILINOGEN UA: 0.2 mg/dL (ref 0.0–1.0)

## 2015-06-15 LAB — LIPASE, BLOOD: Lipase: 20 U/L — ABNORMAL LOW (ref 22–51)

## 2015-06-15 MED ORDER — ONDANSETRON HCL 4 MG/2ML IJ SOLN
4.0000 mg | Freq: Once | INTRAMUSCULAR | Status: DC
  Filled 2015-06-15: qty 2

## 2015-06-15 MED ORDER — HYDROMORPHONE HCL 1 MG/ML IJ SOLN
1.0000 mg | Freq: Once | INTRAMUSCULAR | Status: AC
  Administered 2015-06-15: 1 mg via INTRAMUSCULAR

## 2015-06-15 MED ORDER — ONDANSETRON 4 MG PO TBDP
4.0000 mg | ORAL_TABLET | Freq: Once | ORAL | Status: AC
  Administered 2015-06-15: 4 mg via ORAL
  Filled 2015-06-15: qty 1

## 2015-06-15 MED ORDER — HYDROMORPHONE HCL 1 MG/ML IJ SOLN
1.0000 mg | Freq: Once | INTRAMUSCULAR | Status: DC
  Filled 2015-06-15: qty 1

## 2015-06-15 NOTE — ED Provider Notes (Addendum)
CSN: [id number]     Arrival date & time 06/15/15  1753 History   First MD Initiated Contact with Patient 06/15/15 2154     Chief Complaint  Patient presents with  . Abdominal Pain     (Consider location/radiation/quality/duration/timing/severity/associated sxs/prior Treatment) HPI Comments: Patient presents to the ER for evaluation of right-sided flank pain. Patient has a history of polycystic kidney disease and has had hemorrhagic and infected cysts in the past. Patient was seen in the ER 2 days ago with pain in the right flank area and noncontrast CT showed evidence of hemorrhagic cysts. Patient was treated with analgesia. After discharge from the ER, however, patient developed fever to 102. She contacted her nephrologist, Dr. Mercy Moore, and he started her on Cipro. She reports that the fever has improved since starting the Cipro, but pain has been worse. It is not controlled with her oral pain medicine at home.  Patient is a 59 y.o. female presenting with abdominal pain.  Abdominal Pain Associated symptoms: fever and shortness of breath     Past Medical History  Diagnosis Date  . Hypertension   . Arthritis   . Atrial fibrillation 2012  . Hyperlipidemia   . Polycystic kidney disease   . SDH (subdural hematoma)   . Gout    Past Surgical History  Procedure Laterality Date  . Radial head implant  09/24/2011    Procedure: RADIAL HEAD IMPLANT;  Surgeon: Schuyler Amor, MD;  Location: [place];  Service: Orthopedics;  Laterality: Left;  left radial head replacement  . Orif elbow fracture  10/22/2011    Procedure: OPEN REDUCTION INTERNAL FIXATION (ORIF) ELBOW/OLECRANON FRACTURE;  Surgeon: Schuyler Amor, MD;  Location: [place];  Service: Orthopedics;  Laterality: Left;  open reduction  possible lateral reconstruction with palmaris graft from left or right side.  . Evacuation of subdural hematoma  11/26/2000  . Tonsillectomy    . Colonoscopy   03/2004    repeat 10 years   Family History  Problem Relation Age of Onset  . Polycystic kidney disease Father   . Heart failure Father   . Heart disease Mother     Rheumatic fever   Social History  Substance Use Topics  . Smoking status: Former Smoker -- 2.00 packs/day for 24 years    Types: Cigarettes    Quit date: 12/12/1996  . Smokeless tobacco: Never Used  . Alcohol Use: 1.2 oz/week    2 Glasses of wine per week     Comment: 2-3 glasses wine per day   OB History    Gravida Para Term Preterm AB TAB SAB Ectopic Multiple Living   0 0             Review of Systems  Constitutional: Positive for fever.  Respiratory: Positive for shortness of breath.   Gastrointestinal: Positive for abdominal pain.  Genitourinary: Positive for flank pain.  All other systems reviewed and are negative.     Allergies  Codeine; Morphine; and Zovirax  Home Medications   Prior to Admission medications   Medication Sig Start Date End Date Taking? Authorizing Provider  acetaminophen (TYLENOL) 500 MG tablet Take 1,000 mg by mouth every 6 (six) hours as needed for fever (pain).   Yes Historical Provider, MD  amLODipine-atorvastatin ([name]) 10-80 MG per tablet TAKE ONE TABLET BY MOUTH ONCE DAILY Patient taking differently: Take 1 tablet by mouth at bedtime.  02/24/15  Yes [name] L Breeback, PA-C  ciprofloxacin (CIPRO) 500 MG  tablet Take 500 mg by mouth at bedtime. 10 day course started 06/13/15   Yes Historical Provider, MD  colchicine (COLCRYS) 0.6 MG tablet Take 1 tablet (0.6 mg total) by mouth daily. Patient taking differently: Take 0.6 mg by mouth See admin instructions. Take 1 tablet (0.6 mg) by mouth every hour for 3 hours when needed for gout attacks, repeat 2nd day, if gout continues on 3rd day consult with doctor 10/10/14  Yes [name] L Breeback, PA-C  cyclobenzaprine (FLEXERIL) 10 MG tablet TAKE ONE TABLET BY MOUTH THREE TIMES DAILY AS NEEDED FOR MUSCLE SPASMS Patient taking differently: TAKE  ONE-HALF TABLET BY MOUTH THREE TIMES DAILY AS NEEDED FOR MUSCLE SPASMS 03/24/15  Yes Kem Boroughs, FNP  diphenhydrAMINE (BENADRYL) 25 MG tablet Take 25 mg by mouth every 4 (four) hours as needed (for itching caused by percocet).   Yes Historical Provider, MD  famotidine (PEPCID) 20 MG tablet Take 20 mg by mouth daily with lunch.    Yes Historical Provider, MD  Febuxostat (ULORIC) 80 MG TABS Take 80 mg by mouth daily with lunch.   Yes Historical Provider, MD  furosemide (LASIX) 40 MG tablet Take 40 mg by mouth daily with lunch. 05/30/15  Yes Historical Provider, MD  furosemide (LASIX) 80 MG tablet Take 80 mg by mouth daily with breakfast. 05/16/15  Yes Historical Provider, MD  loratadine (CLARITIN) 10 MG tablet Take 10 mg by mouth daily as needed (seasonal allergies). Daily with lunch   Yes Historical Provider, MD  magnesium oxide (MAG-OX) 400 MG tablet Take 2 tablets (800 mg total) by mouth at bedtime. Patient taking differently: Take 400 mg by mouth at bedtime as needed (cramping).  08/20/14  Yes Silverio Decamp, MD  metoprolol succinate (TOPROL-XL) 100 MG 24 hr tablet Take 100 mg by mouth at bedtime. 05/23/15  Yes Historical Provider, MD  Multiple Vitamin (MULTIVITAMIN WITH MINERALS) TABS tablet Take 1 tablet by mouth daily with lunch. Centrum Silver for Women   Yes Historical Provider, MD  ondansetron (ZOFRAN) 4 MG tablet Take 1 tablet (4 mg total) by mouth every 6 (six) hours. Patient taking differently: Take 4 mg by mouth every 6 (six) hours as needed for nausea or vomiting.  06/13/15  Yes Stevi Barrett, PA-C  oxyCODONE-acetaminophen (PERCOCET/ROXICET) 5-325 MG per tablet Take 1-2 tablets by mouth every 4 (four) hours as needed for severe pain. Patient taking differently: Take 1 tablet by mouth every 4 (four) hours as needed for severe pain.  06/13/15  Yes Stevi Barrett, PA-C  Probiotic Product (PHILLIPS COLON HEALTH PO) Take 1 capsule by mouth daily.   Yes Historical Provider, MD  valsartan  (DIOVAN) 160 MG tablet Take 160 mg by mouth daily.   Yes Historical Provider, MD  venlafaxine XR (EFFEXOR XR) 37.5 MG 24 hr capsule Take 2 tablets daily Patient taking differently: Take 37.5 mg by mouth daily with lunch.  08/29/14  Yes Kem Boroughs, FNP  AMBULATORY NON FORMULARY MEDICATION Knee high compression stocking for bilateral leg edema.  35-58mmHg Patient not taking: Reported on 06/15/2015 09/10/14   Royetta Car Breeback, PA-C  febuxostat (ULORIC) 40 MG tablet Take 1 tablet (40 mg total) by mouth daily. Patient not taking: Reported on 06/15/2015 09/02/14   Donella Stade, PA-C  furosemide (LASIX) 20 MG tablet Take 4 tablets (80 mg total) by mouth daily. Patient not taking: Reported on 06/15/2015 09/18/14   Donella Stade, PA-C  metoprolol succinate (TOPROL-XL) 25 MG 24 hr tablet 75mg  once daily. Patient not taking:  Reported on 06/15/2015 09/18/14   [name] L Breeback, PA-C   BP 126/106 mmHg  Pulse 95  Temp(Src) 99.4 F (37.4 C) (Oral)  Resp 18  Ht 5\' 2"  (1.575 m)  Wt 180 lb (81.647 kg)  BMI 32.91 kg/m2  SpO2 94%  LMP 07/11/2009 (Approximate) Physical Exam  Constitutional: She is oriented to person, place, and time. She appears well-developed and well-nourished. No distress.  HENT:  Head: Normocephalic and atraumatic.  Right Ear: Hearing normal.  Left Ear: Hearing normal.  Nose: Nose normal.  Mouth/Throat: Oropharynx is clear and moist and mucous membranes are normal.  Eyes: Conjunctivae and EOM are normal. Pupils are equal, round, and reactive to light.  Neck: Normal range of motion. Neck supple.  Cardiovascular: Regular rhythm, S1 normal and S2 normal.  Exam reveals no gallop and no friction rub.   No murmur heard. Pulmonary/Chest: Effort normal and breath sounds normal. No respiratory distress. She exhibits no tenderness.  Abdominal: Soft. Normal appearance and bowel sounds are normal. There is no hepatosplenomegaly. There is no tenderness. There is CVA tenderness (R). There is no  rebound, no guarding, no tenderness at McBurney's point and negative Murphy's sign. No hernia.  Musculoskeletal: Normal range of motion.  Neurological: She is alert and oriented to person, place, and time. She has normal strength. No cranial nerve deficit or sensory deficit. Coordination normal. GCS eye subscore is 4. GCS verbal subscore is 5. GCS motor subscore is 6.  Skin: Skin is warm, dry and intact. No rash noted. No cyanosis.  Psychiatric: She has a normal mood and affect. Her speech is normal and behavior is normal. Thought content normal.  Nursing note and vitals reviewed.   ED Course  Procedures (including critical care time) Labs Review Labs Reviewed  LIPASE, BLOOD - Abnormal; Notable for the following:    Lipase 20 (*)    All other components within normal limits  COMPREHENSIVE METABOLIC PANEL - Abnormal; Notable for the following:    Sodium 130 (*)    Chloride 97 (*)    CO2 20 (*)    Glucose, Bld 101 (*)    BUN 92 (*)    Creatinine, Ser 4.67 (*)    Calcium 8.8 (*)    Alkaline Phosphatase 207 (*)    GFR calc non Af Amer 9 (*)    GFR calc Af Amer 11 (*)    All other components within normal limits  CBC - Abnormal; Notable for the following:    RBC 3.43 (*)    Hemoglobin 9.7 (*)    HCT 30.0 (*)    All other components within normal limits  URINE CULTURE  CULTURE, BLOOD (ROUTINE X 2)  CULTURE, BLOOD (ROUTINE X 2)  URINALYSIS, ROUTINE W REFLEX MICROSCOPIC (NOT AT [facility])    Imaging Review No results found. I have personally reviewed and evaluated these images and lab results as part of my medical decision-making.   EKG Interpretation   Date/Time:  Sunday June 15 2015 17:57:05 EDT Ventricular Rate:  96 PR Interval:  164 QRS Duration: 68 QT Interval:  306 QTC Calculation: 386 R Axis:   -32 Text Interpretation:  Normal sinus rhythm Left axis deviation Pulmonary  disease pattern Septal infarct , age undetermined Abnormal ECG No  significant change since  last tracing Confirmed by [place] [phone]) on 06/15/2015 9:57:33 PM      MDM   Final diagnoses:  None  Hemorrhagic Renal Cyst  Patient presents to the ER for  worsening right flank pain with history of polycystic kidney disease and hemorrhagic and infected cysts. Patient had CT scan 2 days ago that did show evidence of hemorrhagic cyst. She has developed fever since. She has been on oral Cipro since yesterday with improvement of the fever. She tells me that when she had similar symptoms in the past she became very sick with infection. Blood work reveals acute kidney injury. Baseline creatinine is in the mid 2 range, creatinine is 4.67 today. That is about 2.92 days ago. Case was discussed with Dr. Jimmy Footman who recommends performing renal ultrasound to rule out obstruction. Patient to be admitted to the hospital for further management.  Patient indicated that she was experiencing some difficulty breathing. Patient reports that the amount of pain she was experiencing in the flank was causing her to have difficulty breathing, as opposed to actual shortness of breath. X-ray was performed, however, to further evaluate. There was no pathology noted.    Orpah Greek, MD 06/15/15 2217  Orpah Greek, MD 06/16/15 [phone]

## 2015-06-15 NOTE — ED Notes (Signed)
Pt c/o right upper abd pain since Friday morning. States that she came into the ED and was told tha a cyst on her kidney had burst which was causing the pain. Pt has hx of polycystic kidney disease. States that the pain has not gotten better.

## 2015-06-15 NOTE — ED Notes (Signed)
Pt states that she started taking Cipro (prescribed by [facility], Dr Mercy Moore) on Friday.

## 2015-06-16 ENCOUNTER — Encounter (HOSPITAL_COMMUNITY): Payer: Self-pay | Admitting: Internal Medicine

## 2015-06-16 ENCOUNTER — Inpatient Hospital Stay (HOSPITAL_COMMUNITY): Payer: BLUE CROSS/BLUE SHIELD

## 2015-06-16 DIAGNOSIS — Z79891 Long term (current) use of opiate analgesic: Secondary | ICD-10-CM | POA: Diagnosis not present

## 2015-06-16 DIAGNOSIS — N179 Acute kidney failure, unspecified: Secondary | ICD-10-CM | POA: Diagnosis not present

## 2015-06-16 DIAGNOSIS — I129 Hypertensive chronic kidney disease with stage 1 through stage 4 chronic kidney disease, or unspecified chronic kidney disease: Secondary | ICD-10-CM | POA: Diagnosis present

## 2015-06-16 DIAGNOSIS — R509 Fever, unspecified: Secondary | ICD-10-CM | POA: Diagnosis not present

## 2015-06-16 DIAGNOSIS — E785 Hyperlipidemia, unspecified: Secondary | ICD-10-CM | POA: Diagnosis present

## 2015-06-16 DIAGNOSIS — M109 Gout, unspecified: Secondary | ICD-10-CM | POA: Diagnosis present

## 2015-06-16 DIAGNOSIS — Q613 Polycystic kidney, unspecified: Secondary | ICD-10-CM | POA: Diagnosis not present

## 2015-06-16 DIAGNOSIS — N183 Chronic kidney disease, stage 3 (moderate): Secondary | ICD-10-CM | POA: Diagnosis present

## 2015-06-16 DIAGNOSIS — D631 Anemia in chronic kidney disease: Secondary | ICD-10-CM | POA: Diagnosis present

## 2015-06-16 DIAGNOSIS — E872 Acidosis: Secondary | ICD-10-CM | POA: Diagnosis present

## 2015-06-16 DIAGNOSIS — Z79899 Other long term (current) drug therapy: Secondary | ICD-10-CM | POA: Diagnosis not present

## 2015-06-16 DIAGNOSIS — N170 Acute kidney failure with tubular necrosis: Secondary | ICD-10-CM | POA: Diagnosis present

## 2015-06-16 DIAGNOSIS — R109 Unspecified abdominal pain: Secondary | ICD-10-CM | POA: Diagnosis not present

## 2015-06-16 DIAGNOSIS — Z888 Allergy status to other drugs, medicaments and biological substances status: Secondary | ICD-10-CM | POA: Diagnosis not present

## 2015-06-16 DIAGNOSIS — R1011 Right upper quadrant pain: Secondary | ICD-10-CM | POA: Diagnosis present

## 2015-06-16 DIAGNOSIS — Z885 Allergy status to narcotic agent status: Secondary | ICD-10-CM | POA: Diagnosis not present

## 2015-06-16 DIAGNOSIS — I10 Essential (primary) hypertension: Secondary | ICD-10-CM | POA: Diagnosis not present

## 2015-06-16 DIAGNOSIS — M1990 Unspecified osteoarthritis, unspecified site: Secondary | ICD-10-CM | POA: Diagnosis present

## 2015-06-16 DIAGNOSIS — Z87891 Personal history of nicotine dependence: Secondary | ICD-10-CM | POA: Diagnosis not present

## 2015-06-16 LAB — CBC WITH DIFFERENTIAL/PLATELET
Basophils Absolute: 0 10*3/uL (ref 0.0–0.1)
Basophils Relative: 0 % (ref 0–1)
Eosinophils Absolute: 0.1 10*3/uL (ref 0.0–0.7)
Eosinophils Relative: 2 % (ref 0–5)
HEMATOCRIT: 26.6 % — AB (ref 36.0–46.0)
HEMOGLOBIN: 8.7 g/dL — AB (ref 12.0–15.0)
LYMPHS ABS: 0.8 10*3/uL (ref 0.7–4.0)
LYMPHS PCT: 14 % (ref 12–46)
MCH: 28.2 pg (ref 26.0–34.0)
MCHC: 32.7 g/dL (ref 30.0–36.0)
MCV: 86.1 fL (ref 78.0–100.0)
Monocytes Absolute: 0.2 10*3/uL (ref 0.1–1.0)
Monocytes Relative: 4 % (ref 3–12)
NEUTROS ABS: 4.2 10*3/uL (ref 1.7–7.7)
NEUTROS PCT: 80 % — AB (ref 43–77)
Platelets: 223 10*3/uL (ref 150–400)
RBC: 3.09 MIL/uL — AB (ref 3.87–5.11)
RDW: 12.9 % (ref 11.5–15.5)
WBC: 5.4 10*3/uL (ref 4.0–10.5)

## 2015-06-16 LAB — COMPREHENSIVE METABOLIC PANEL
ALT: 32 U/L (ref 14–54)
ANION GAP: 13 (ref 5–15)
AST: 28 U/L (ref 15–41)
Albumin: 3.2 g/dL — ABNORMAL LOW (ref 3.5–5.0)
Alkaline Phosphatase: 205 U/L — ABNORMAL HIGH (ref 38–126)
BUN: 93 mg/dL — AB (ref 6–20)
CHLORIDE: 96 mmol/L — AB (ref 101–111)
CO2: 19 mmol/L — ABNORMAL LOW (ref 22–32)
Calcium: 8.2 mg/dL — ABNORMAL LOW (ref 8.9–10.3)
Creatinine, Ser: 4.59 mg/dL — ABNORMAL HIGH (ref 0.44–1.00)
GFR, EST AFRICAN AMERICAN: 11 mL/min — AB (ref >60)
GFR, EST NON AFRICAN AMERICAN: 10 mL/min — AB (ref >60)
Glucose, Bld: 103 mg/dL — ABNORMAL HIGH (ref 65–99)
POTASSIUM: 4 mmol/L (ref 3.5–5.1)
Sodium: 128 mmol/L — ABNORMAL LOW (ref 135–145)
Total Bilirubin: 1 mg/dL (ref 0.3–1.2)
Total Protein: 6.5 g/dL (ref 6.5–8.1)

## 2015-06-16 LAB — LACTIC ACID, PLASMA: LACTIC ACID, VENOUS: 0.6 mmol/L (ref 0.5–2.0)

## 2015-06-16 MED ORDER — LORATADINE 10 MG PO TABS: 10.0000 mg | ORAL_TABLET | Freq: Every day | ORAL | Status: DC | PRN

## 2015-06-16 MED ORDER — PIPERACILLIN-TAZOBACTAM IN DEX 2-0.25 GM/50ML IV SOLN
2.2500 g | Freq: Four times a day (QID) | INTRAVENOUS | Status: DC
  Administered 2015-06-16 – 2015-06-17 (×5): 2.25 g via INTRAVENOUS
  Filled 2015-06-16 (×9): qty 50

## 2015-06-16 MED ORDER — FAMOTIDINE 20 MG PO TABS
20.0000 mg | ORAL_TABLET | Freq: Every day | ORAL | Status: DC
  Administered 2015-06-16 – 2015-06-17 (×2): 20 mg via ORAL
  Filled 2015-06-16 (×2): qty 1

## 2015-06-16 MED ORDER — HYDRALAZINE HCL 20 MG/ML IJ SOLN: 10.0000 mg | INTRAMUSCULAR | Status: DC | PRN

## 2015-06-16 MED ORDER — FEBUXOSTAT 40 MG PO TABS
80.0000 mg | ORAL_TABLET | Freq: Every day | ORAL | Status: DC
  Administered 2015-06-16 – 2015-06-17 (×2): 80 mg via ORAL
  Filled 2015-06-16 (×3): qty 2

## 2015-06-16 MED ORDER — HYDROMORPHONE HCL 1 MG/ML IJ SOLN
1.0000 mg | INTRAMUSCULAR | Status: DC | PRN
  Administered 2015-06-16 – 2015-06-17 (×5): 1 mg via INTRAVENOUS
  Filled 2015-06-16 (×5): qty 1

## 2015-06-16 MED ORDER — PIPERACILLIN-TAZOBACTAM 3.375 G IVPB 30 MIN
3.3750 g | Freq: Once | INTRAVENOUS | Status: AC
  Administered 2015-06-16: 3.375 g via INTRAVENOUS
  Filled 2015-06-16: qty 50

## 2015-06-16 MED ORDER — METOPROLOL SUCCINATE ER 100 MG PO TB24
100.0000 mg | ORAL_TABLET | Freq: Every day | ORAL | Status: DC
  Administered 2015-06-16: 100 mg via ORAL
  Filled 2015-06-16: qty 1

## 2015-06-16 MED ORDER — VENLAFAXINE HCL ER 37.5 MG PO CP24
37.5000 mg | ORAL_CAPSULE | Freq: Every day | ORAL | Status: DC
  Administered 2015-06-16 – 2015-06-17 (×2): 37.5 mg via ORAL
  Filled 2015-06-16 (×2): qty 1

## 2015-06-16 MED ORDER — DEXTROSE 5 % IV SOLN
1.0000 g | Freq: Once | INTRAVENOUS | Status: AC
  Administered 2015-06-16: 1 g via INTRAVENOUS
  Filled 2015-06-16: qty 10

## 2015-06-16 MED ORDER — AMLODIPINE-ATORVASTATIN 10-80 MG PO TABS: 1.0000 | ORAL_TABLET | Freq: Every day | ORAL | Status: DC

## 2015-06-16 MED ORDER — ONDANSETRON HCL 4 MG PO TABS: 4.0000 mg | ORAL_TABLET | Freq: Four times a day (QID) | ORAL | Status: DC | PRN

## 2015-06-16 MED ORDER — ACETAMINOPHEN 650 MG RE SUPP: 650.0000 mg | Freq: Four times a day (QID) | RECTAL | Status: DC | PRN

## 2015-06-16 MED ORDER — ONDANSETRON HCL 4 MG/2ML IJ SOLN: 4.0000 mg | Freq: Four times a day (QID) | INTRAMUSCULAR | Status: DC | PRN

## 2015-06-16 MED ORDER — VANCOMYCIN HCL IN DEXTROSE 1-5 GM/200ML-% IV SOLN
1000.0000 mg | Freq: Once | INTRAVENOUS | Status: AC
  Administered 2015-06-16: 1000 mg via INTRAVENOUS
  Filled 2015-06-16: qty 200

## 2015-06-16 MED ORDER — SODIUM CHLORIDE 0.9 % IV SOLN
INTRAVENOUS | Status: DC
  Administered 2015-06-16: 03:00:00 via INTRAVENOUS

## 2015-06-16 MED ORDER — ACETAMINOPHEN 325 MG PO TABS: 650.0000 mg | ORAL_TABLET | Freq: Four times a day (QID) | ORAL | Status: DC | PRN

## 2015-06-16 MED ORDER — ATORVASTATIN CALCIUM 80 MG PO TABS
80.0000 mg | ORAL_TABLET | Freq: Every day | ORAL | Status: DC
  Administered 2015-06-16: 80 mg via ORAL
  Filled 2015-06-16: qty 1

## 2015-06-16 MED ORDER — DARBEPOETIN ALFA 60 MCG/0.3ML IJ SOSY
60.0000 ug | PREFILLED_SYRINGE | INTRAMUSCULAR | Status: DC
  Administered 2015-06-17: 60 ug via SUBCUTANEOUS
  Filled 2015-06-16 (×2): qty 0.3

## 2015-06-16 MED ORDER — VANCOMYCIN HCL IN DEXTROSE 750-5 MG/150ML-% IV SOLN: 750.0000 mg | INTRAVENOUS | Status: DC

## 2015-06-16 MED ORDER — AMLODIPINE BESYLATE 10 MG PO TABS: 10.0000 mg | ORAL_TABLET | Freq: Every day | ORAL | Status: DC

## 2015-06-16 NOTE — Progress Notes (Signed)
Incentive spirometry given to patient.  Patient verbalized understanding on when and how to use.

## 2015-06-16 NOTE — Consult Note (Signed)
[facility] Renal Consultation Note  Requesting MD: Elgergawy Indication for Consultation: A on CRF, PKD patient   HPI:  Mariah [name] is a 59 y.o. female normal with past medical history significant for hypertension, atrial fibrillation, as well as polycystic kidney disease and chronic renal failure followed by Dr. Mercy Moore at the [place] [facility]. Her last known creatinine to have been in the high twos. She presented twice to the emergency department over the weekend with abdominal pain. On Friday CT of the abdomen and pelvis did not show anything acute therefore she was discharged. After she went home she developed fever. She had called the nephrology office and got a prescription for Cipro. However, pain continued to worsen so they returned to the emergency department late last night. Of note in her labs creatinine was noted to be 4.6 which was worse than her baseline so she was admitted. She's been given IV antibiotic's and a presumed diagnosis is an infected ruptured renal cyst. We are asked to see her for her acute on chronic renal failure.  She's had a MAXIMUM TEMPERATURE of 99.4 here. Her white count is within normal limits. Her UA is really unremarkable. She still has right-sided abdominal pain. She reported decreased urine output but seems to be picking up. She is complaining of swelling.   CREATININE  Date/Time Value Ref Range Status  02/14/2015 2.7* 0.5 - 1.1 mg/dL Final  08/08/2012 1.2* 0.5 - 1.1 mg/dL   08/04/2011 1.0 0.5 - 1.1 mg/dL    CREAT  Date/Time Value Ref Range Status  09/10/2014 02:28 PM 2.77* 0.50 - 1.10 mg/dL Final  06/08/2013 10:03 AM 1.50* 0.50 - 1.10 mg/dL Final  11/03/2012 11:44 AM 1.24* 0.50 - 1.10 mg/dL Final   CREATININE, SER  Date/Time Value Ref Range Status  06/16/2015 03:23 AM 4.59* 0.44 - 1.00 mg/dL Final  06/15/2015 06:20 PM 4.67* 0.44 - 1.00 mg/dL Final  06/13/2015 03:00 PM 2.91* 0.44 - 1.00 mg/dL Final  10/22/2011 01:36 PM  0.90 0.50 - 1.10 mg/dL Final  09/21/2011 12:00 PM 0.91 0.50 - 1.10 mg/dL Final  10/09/2010 11:05 PM 0.95 0.40-1.20 mg/dL Final  04/05/2008 0.73 mg/dL   02/24/2007 05:58 PM 0.79 0.40-1.20 mg/dL Final     PMHx:   Past Medical History  Diagnosis Date  . Hypertension   . Arthritis   . Atrial fibrillation 2012  . Hyperlipidemia   . Polycystic kidney disease   . SDH (subdural hematoma)   . Gout     Past Surgical History  Procedure Laterality Date  . Radial head implant  09/24/2011    Procedure: RADIAL HEAD IMPLANT;  Surgeon: Schuyler Amor, MD;  Location: [facility];  Service: Orthopedics;  Laterality: Left;  left radial head replacement  . Orif elbow fracture  10/22/2011    Procedure: OPEN REDUCTION INTERNAL FIXATION (ORIF) ELBOW/OLECRANON FRACTURE;  Surgeon: Schuyler Amor, MD;  Location: [facility];  Service: Orthopedics;  Laterality: Left;  open reduction  possible lateral reconstruction with palmaris graft from left or right side.  . Evacuation of subdural hematoma  11/26/2000  . Tonsillectomy    . Colonoscopy  03/2004    repeat 10 years    Family Hx:  Family History  Problem Relation Age of Onset  . Polycystic kidney disease Father   . Heart failure Father   . Heart disease Mother     Rheumatic fever    Social History:  reports that she quit smoking about 18 years  ago. Her smoking use included Cigarettes. She has a 48 pack-year smoking history. She has never used smokeless tobacco. She reports that she drinks about 1.2 oz of alcohol per week. She reports that she does not use illicit drugs.  Allergies:  Allergies  Allergen Reactions  . Codeine Itching  . Morphine Itching  . Zovirax [Acyclovir Sodium] Itching    Medications: Prior to Admission medications   Medication Sig Start Date End Date Taking? Authorizing Provider  acetaminophen (TYLENOL) 500 MG tablet Take 1,000 mg by mouth every 6 (six) hours as needed for fever (pain).    Yes Historical Provider, MD  amLODipine-atorvastatin ([name]) 10-80 MG per tablet TAKE ONE TABLET BY MOUTH ONCE DAILY Patient taking differently: Take 1 tablet by mouth at bedtime.  02/24/15  Yes [name] L Breeback, PA-C  ciprofloxacin (CIPRO) 500 MG tablet Take 500 mg by mouth at bedtime. 10 day course started 06/13/15   Yes Historical Provider, MD  colchicine (COLCRYS) 0.6 MG tablet Take 1 tablet (0.6 mg total) by mouth daily. Patient taking differently: Take 0.6 mg by mouth See admin instructions. Take 1 tablet (0.6 mg) by mouth every hour for 3 hours when needed for gout attacks, repeat 2nd day, if gout continues on 3rd day consult with doctor 10/10/14  Yes [name] L Breeback, PA-C  cyclobenzaprine (FLEXERIL) 10 MG tablet TAKE ONE TABLET BY MOUTH THREE TIMES DAILY AS NEEDED FOR MUSCLE SPASMS Patient taking differently: TAKE ONE-HALF TABLET BY MOUTH THREE TIMES DAILY AS NEEDED FOR MUSCLE SPASMS 03/24/15  Yes Kem Boroughs, FNP  diphenhydrAMINE (BENADRYL) 25 MG tablet Take 25 mg by mouth every 4 (four) hours as needed (for itching caused by percocet).   Yes Historical Provider, MD  famotidine (PEPCID) 20 MG tablet Take 20 mg by mouth daily with lunch.    Yes Historical Provider, MD  Febuxostat (ULORIC) 80 MG TABS Take 80 mg by mouth daily with lunch.   Yes Historical Provider, MD  furosemide (LASIX) 40 MG tablet Take 40 mg by mouth daily with lunch. 05/30/15  Yes Historical Provider, MD  furosemide (LASIX) 80 MG tablet Take 80 mg by mouth daily with breakfast. 05/16/15  Yes Historical Provider, MD  loratadine (CLARITIN) 10 MG tablet Take 10 mg by mouth daily as needed (seasonal allergies). Daily with lunch   Yes Historical Provider, MD  magnesium oxide (MAG-OX) 400 MG tablet Take 2 tablets (800 mg total) by mouth at bedtime. Patient taking differently: Take 400 mg by mouth at bedtime as needed (cramping).  08/20/14  Yes Silverio Decamp, MD  metoprolol succinate (TOPROL-XL) 100 MG 24 hr tablet Take 100  mg by mouth at bedtime. 05/23/15  Yes Historical Provider, MD  Multiple Vitamin (MULTIVITAMIN WITH MINERALS) TABS tablet Take 1 tablet by mouth daily with lunch. Centrum Silver for Women   Yes Historical Provider, MD  ondansetron (ZOFRAN) 4 MG tablet Take 1 tablet (4 mg total) by mouth every 6 (six) hours. Patient taking differently: Take 4 mg by mouth every 6 (six) hours as needed for nausea or vomiting.  06/13/15  Yes Stevi Barrett, PA-C  oxyCODONE-acetaminophen (PERCOCET/ROXICET) 5-325 MG per tablet Take 1-2 tablets by mouth every 4 (four) hours as needed for severe pain. Patient taking differently: Take 1 tablet by mouth every 4 (four) hours as needed for severe pain.  06/13/15  Yes Stevi Barrett, PA-C  Probiotic Product (PHILLIPS COLON HEALTH PO) Take 1 capsule by mouth daily.   Yes Historical Provider, MD  valsartan (DIOVAN) 160 MG tablet  Take 160 mg by mouth daily.   Yes Historical Provider, MD  venlafaxine XR (EFFEXOR XR) 37.5 MG 24 hr capsule Take 2 tablets daily Patient taking differently: Take 37.5 mg by mouth daily with lunch.  08/29/14  Yes Kem Boroughs, FNP  AMBULATORY NON FORMULARY MEDICATION Knee high compression stocking for bilateral leg edema.  35-61mHg Patient not taking: Reported on 06/15/2015 09/10/14   JRoyetta CarBreeback, PA-C  febuxostat (ULORIC) 40 MG tablet Take 1 tablet (40 mg total) by mouth daily. Patient not taking: Reported on 06/15/2015 09/02/14   JDonella Stade PA-C  furosemide (LASIX) 20 MG tablet Take 4 tablets (80 mg total) by mouth daily. Patient not taking: Reported on 06/15/2015 09/18/14   JDonella Stade PA-C  metoprolol succinate (TOPROL-XL) 25 MG 24 hr tablet 766monce daily. Patient not taking: Reported on 06/15/2015 09/18/14   JaDonella StadePA-C    I have reviewed the patient's current medications.  Labs:  Results for orders placed or performed during the hospital encounter of 06/15/15 (from the past 48 hour(s))  Lipase, blood     Status: Abnormal    Collection Time: 06/15/15  6:20 PM  Result Value Ref Range   Lipase 20 (L) 22 - 51 U/L  Comprehensive metabolic panel     Status: Abnormal   Collection Time: 06/15/15  6:20 PM  Result Value Ref Range   Sodium 130 (L) 135 - 145 mmol/L   Potassium 4.5 3.5 - 5.1 mmol/L   Chloride 97 (L) 101 - 111 mmol/L   CO2 20 (L) 22 - 32 mmol/L   Glucose, Bld 101 (H) 65 - 99 mg/dL   BUN 92 (H) 6 - 20 mg/dL   Creatinine, Ser 4.67 (H) 0.44 - 1.00 mg/dL   Calcium 8.8 (L) 8.9 - 10.3 mg/dL   Total Protein 6.8 6.5 - 8.1 g/dL   Albumin 3.6 3.5 - 5.0 g/dL   AST 33 15 - 41 U/L   ALT 35 14 - 54 U/L   Alkaline Phosphatase 207 (H) 38 - 126 U/L   Total Bilirubin 0.7 0.3 - 1.2 mg/dL   GFR calc non Af Amer 9 (L) >60 mL/min   GFR calc Af Amer 11 (L) >60 mL/min    Comment: (NOTE) The eGFR has been calculated using the CKD EPI equation. This calculation has not been validated in all clinical situations. eGFR's persistently <60 mL/min signify possible Chronic Kidney Disease.    Anion gap 13 5 - 15  CBC     Status: Abnormal   Collection Time: 06/15/15  6:20 PM  Result Value Ref Range   WBC 8.7 4.0 - 10.5 K/uL   RBC 3.43 (L) 3.87 - 5.11 MIL/uL   Hemoglobin 9.7 (L) 12.0 - 15.0 g/dL   HCT 30.0 (L) 36.0 - 46.0 %   MCV 87.5 78.0 - 100.0 fL   MCH 28.3 26.0 - 34.0 pg   MCHC 32.3 30.0 - 36.0 g/dL   RDW 13.0 11.5 - 15.5 %   Platelets 231 150 - 400 K/uL  Urinalysis, Routine w reflex microscopic ([facility]    Status: Abnormal   Collection Time: 06/15/15 10:40 PM  Result Value Ref Range   Color, Urine YELLOW YELLOW   APPearance CLOUDY (A) CLEAR   Specific Gravity, Urine 1.009 1.005 - 1.030   pH 5.0 5.0 - 8.0   Glucose, UA NEGATIVE NEGATIVE mg/dL   Hgb urine dipstick NEGATIVE NEGATIVE   Bilirubin Urine NEGATIVE NEGATIVE  Ketones, ur NEGATIVE NEGATIVE mg/dL   Protein, ur NEGATIVE NEGATIVE mg/dL   Urobilinogen, UA 0.2 0.0 - 1.0 mg/dL   Nitrite NEGATIVE NEGATIVE   Leukocytes, UA NEGATIVE NEGATIVE    Comment:  MICROSCOPIC NOT DONE ON URINES WITH NEGATIVE PROTEIN, BLOOD, LEUKOCYTES, NITRITE, OR GLUCOSE <1000 mg/dL.  Lactic acid, plasma     Status: None   Collection Time: 06/16/15  3:23 AM  Result Value Ref Range   Lactic Acid, Venous 0.6 0.5 - 2.0 mmol/L  Comprehensive metabolic panel     Status: Abnormal   Collection Time: 06/16/15  3:23 AM  Result Value Ref Range   Sodium 128 (L) 135 - 145 mmol/L   Potassium 4.0 3.5 - 5.1 mmol/L   Chloride 96 (L) 101 - 111 mmol/L   CO2 19 (L) 22 - 32 mmol/L   Glucose, Bld 103 (H) 65 - 99 mg/dL   BUN 93 (H) 6 - 20 mg/dL   Creatinine, Ser 4.59 (H) 0.44 - 1.00 mg/dL   Calcium 8.2 (L) 8.9 - 10.3 mg/dL   Total Protein 6.5 6.5 - 8.1 g/dL   Albumin 3.2 (L) 3.5 - 5.0 g/dL   AST 28 15 - 41 U/L   ALT 32 14 - 54 U/L   Alkaline Phosphatase 205 (H) 38 - 126 U/L   Total Bilirubin 1.0 0.3 - 1.2 mg/dL   GFR calc non Af Amer 10 (L) >60 mL/min   GFR calc Af Amer 11 (L) >60 mL/min    Comment: (NOTE) The eGFR has been calculated using the CKD EPI equation. This calculation has not been validated in all clinical situations. eGFR's persistently <60 mL/min signify possible Chronic Kidney Disease.    Anion gap 13 5 - 15  CBC WITH DIFFERENTIAL     Status: Abnormal   Collection Time: 06/16/15  3:23 AM  Result Value Ref Range   WBC 5.4 4.0 - 10.5 K/uL   RBC 3.09 (L) 3.87 - 5.11 MIL/uL   Hemoglobin 8.7 (L) 12.0 - 15.0 g/dL   HCT 26.6 (L) 36.0 - 46.0 %   MCV 86.1 78.0 - 100.0 fL   MCH 28.2 26.0 - 34.0 pg   MCHC 32.7 30.0 - 36.0 g/dL   RDW 12.9 11.5 - 15.5 %   Platelets 223 150 - 400 K/uL   Neutrophils Relative % 80 (H) 43 - 77 %   Neutro Abs 4.2 1.7 - 7.7 K/uL   Lymphocytes Relative 14 12 - 46 %   Lymphs Abs 0.8 0.7 - 4.0 K/uL   Monocytes Relative 4 3 - 12 %   Monocytes Absolute 0.2 0.1 - 1.0 K/uL   Eosinophils Relative 2 0 - 5 %   Eosinophils Absolute 0.1 0.0 - 0.7 K/uL   Basophils Relative 0 0 - 1 %   Basophils Absolute 0.0 0.0 - 0.1 K/uL     ROS:  A  comprehensive review of systems was negative except for: Gastrointestinal: positive for abdominal pain  Physical Exam: Filed Vitals:   06/16/15 0843  BP: 102/56  Pulse: 92  Temp: 98.8 F (37.1 C)  Resp: 18     General: Pale, obese white female with nasal cannula oxygen in place HEENT: Pupils are equal round reactive to light, extraocular motions are intact Heart: Slightly tachycardic Lungs: Poor effort due to abdominal pain Abdomen: Distended, tender mostly on the right side. No guarding or rebound Extremities: Trace to 1+ pitting edema bilaterally Skin: Warm and dry  Neuro: Alert and nonfocal  Assessment/Plan:  59 year old white female with polycystic kidney disease and fairly advanced CKD at baseline. She presents with abdominal pain presumed kidney cyst rupture also with fever and acute on chronic renal failure  1. Renal-  acute on chronic renal failure. Her baseline creatinine seems to be in the 2.6- 2.8 range. Now presenting at 4.6. She reported decreased urine output but states now it is picking up. I suspect her acute kidney injury is a result of this ruptured/infected renal cyst. There also could be an element of ATN with decreased blood pressure.  She does not appear uremic at this time. There are no dialysis and medications. 2. Hypertension/volume  - her blood pressure is borderline. The patient and her spouse notice that she is retaining more fluid. She had been started on IV fluids and her Lasix is on hold. I'm going to stop the IV fluids because I believe her tank and his full. Regarding her low blood pressure I'm going to hold her amlodipine. 3.  renal cyst pathology  - the presumed diagnosis is a ruptured renal cyst with possible infection. Her urinalysis is clear. She is on antibiotics.  To be continued on Zosyn and vanc. Pain control is an issue as well 4. Anemia- hemoglobin dropping with hydration- will check iron stores and also give a dose of ESA. 5. Dispo- patient is  asking about going home. Told them that we would need to see a trend in the correct direction as far as her renal function before that could happen.   Thank you for this consultation. We will continue to follow with you  Erminio Nygard A 06/16/2015, 12:59 PM

## 2015-06-16 NOTE — ED Notes (Signed)
Attempted to call report

## 2015-06-16 NOTE — Progress Notes (Signed)
ANTIBIOTIC CONSULT NOTE Pharmacy Consult for Vancomycin and Zosyn  Indication: rule out sepsis  Allergies  Allergen Reactions  . Codeine Itching  . Morphine Itching  . Zovirax [Acyclovir Sodium] Itching    Patient Measurements: Height: 5\' 2"  (157.5 cm) Weight: 170 lb (77.111 kg) IBW/kg (Calculated) : 50.1 Adjusted Body Weight: 65 kg  Vital Signs: Temp: 97.3 F (36.3 C) (09/05 0253) Temp Source: Oral (09/05 0253) BP: 139/92 mmHg (09/05 0253) Pulse Rate: 112 (09/05 0253) Intake/Output from previous day:   Intake/Output from this shift:    Labs:  Recent Labs  06/13/15 1500 06/15/15 1820  WBC 11.2* 8.7  HGB 10.6* 9.7*  PLT 239 231  CREATININE 2.91* 4.67*   Estimated Creatinine Clearance: 12.3 mL/min (by C-G formula based on Cr of 4.67). No results for input(s): VANCOTROUGH, VANCOPEAK, VANCORANDOM, GENTTROUGH, GENTPEAK, GENTRANDOM, TOBRATROUGH, TOBRAPEAK, TOBRARND, AMIKACINPEAK, AMIKACINTROU, AMIKACIN in the last 72 hours.   Microbiology: No results found for this or any previous visit (from the past 720 hour(s)).  Medical History: Past Medical History  Diagnosis Date  . Hypertension   . Arthritis   . Atrial fibrillation 2012  . Hyperlipidemia   . Polycystic kidney disease   . SDH (subdural hematoma)   . Gout     Medications:  Prescriptions prior to admission  Medication Sig Dispense Refill Last Dose  . acetaminophen (TYLENOL) 500 MG tablet Take 1,000 mg by mouth every 6 (six) hours as needed for fever (pain).   06/15/2015 at noon  . amLODipine-atorvastatin ([name]) 10-80 MG per tablet TAKE ONE TABLET BY MOUTH ONCE DAILY (Patient taking differently: Take 1 tablet by mouth at bedtime. ) 30 tablet 3 06/14/2015 at Unknown time  . ciprofloxacin (CIPRO) 500 MG tablet Take 500 mg by mouth at bedtime. 10 day course started 06/13/15   06/14/2015 at Unknown time  . colchicine (COLCRYS) 0.6 MG tablet Take 1 tablet (0.6 mg total) by mouth daily. (Patient taking differently:  Take 0.6 mg by mouth See admin instructions. Take 1 tablet (0.6 mg) by mouth every hour for 3 hours when needed for gout attacks, repeat 2nd day, if gout continues on 3rd day consult with doctor) 30 tablet 5 few months ago  . cyclobenzaprine (FLEXERIL) 10 MG tablet TAKE ONE TABLET BY MOUTH THREE TIMES DAILY AS NEEDED FOR MUSCLE SPASMS (Patient taking differently: TAKE ONE-HALF TABLET BY MOUTH THREE TIMES DAILY AS NEEDED FOR MUSCLE SPASMS) 30 tablet 5 06/12/2015  . diphenhydrAMINE (BENADRYL) 25 MG tablet Take 25 mg by mouth every 4 (four) hours as needed (for itching caused by percocet).   06/15/2015 at 730  . famotidine (PEPCID) 20 MG tablet Take 20 mg by mouth daily with lunch.    06/15/2015 at Unknown time  . Febuxostat (ULORIC) 80 MG TABS Take 80 mg by mouth daily with lunch.   06/15/2015 at Unknown time  . furosemide (LASIX) 40 MG tablet Take 40 mg by mouth daily with lunch.  8 06/15/2015 at Unknown time  . furosemide (LASIX) 80 MG tablet Take 80 mg by mouth daily with breakfast.  8 06/15/2015 at Unknown time  . loratadine (CLARITIN) 10 MG tablet Take 10 mg by mouth daily as needed (seasonal allergies). Daily with lunch   06/15/2015 at Unknown time  . magnesium oxide (MAG-OX) 400 MG tablet Take 2 tablets (800 mg total) by mouth at bedtime. (Patient taking differently: Take 400 mg by mouth at bedtime as needed (cramping). ) 90 tablet 3 06/11/2015  . metoprolol succinate (TOPROL-XL) 100 MG  24 hr tablet Take 100 mg by mouth at bedtime.  1 06/14/2015 at 2230  . Multiple Vitamin (MULTIVITAMIN WITH MINERALS) TABS tablet Take 1 tablet by mouth daily with lunch. Centrum Silver for Women   06/13/2015  . ondansetron (ZOFRAN) 4 MG tablet Take 1 tablet (4 mg total) by mouth every 6 (six) hours. (Patient taking differently: Take 4 mg by mouth every 6 (six) hours as needed for nausea or vomiting. ) 12 tablet 0 06/14/2015 at Unknown time  . oxyCODONE-acetaminophen (PERCOCET/ROXICET) 5-325 MG per tablet Take 1-2 tablets by mouth every 4  (four) hours as needed for severe pain. (Patient taking differently: Take 1 tablet by mouth every 4 (four) hours as needed for severe pain. ) 20 tablet 0 06/15/2015 at 730  . Probiotic Product (PHILLIPS COLON HEALTH PO) Take 1 capsule by mouth daily.   06/15/2015 at Unknown time  . valsartan (DIOVAN) 160 MG tablet Take 160 mg by mouth daily.   06/15/2015 at Unknown time  . venlafaxine XR (EFFEXOR XR) 37.5 MG 24 hr capsule Take 2 tablets daily (Patient taking differently: Take 37.5 mg by mouth daily with lunch. ) 120 capsule 3 06/15/2015 at Unknown time  . AMBULATORY NON FORMULARY MEDICATION Knee high compression stocking for bilateral leg edema.  35-105mmHg (Patient not taking: Reported on 10/12/8784) 1 application 0 Not Taking at Unknown time  . febuxostat (ULORIC) 40 MG tablet Take 1 tablet (40 mg total) by mouth daily. (Patient not taking: Reported on 06/15/2015) 30 tablet 1 Not Taking at Unknown time  . furosemide (LASIX) 20 MG tablet Take 4 tablets (80 mg total) by mouth daily. (Patient not taking: Reported on 06/15/2015) 30 tablet 0 Not Taking at Unknown time  . metoprolol succinate (TOPROL-XL) 25 MG 24 hr tablet 75mg  once daily. (Patient not taking: Reported on 06/15/2015) 30 tablet 0 Not Taking at Unknown time   Assessment: 59 y.o. female with abdominal pain, possible renal abcess, for empiric antibiotics.  Vancomycin 1 g IV given in ED at 0130   Goal of Therapy:  Vancomycin trough level 15-20 mcg/ml  Plan:  Vancomycin 750 mg IV q48h Zosyn 2.25 g IV q6h  Caryl Pina 06/16/2015,3:33 AM

## 2015-06-16 NOTE — H&P (Addendum)
[facility] History and Physical  Mariah [name] PZW:[id number] DOB: 12/21/1955 DOA: 06/15/2015  Referring physician: Dr. Tawanna Sat. PCP: Iran Planas, PA-C  Specialists: Dr. Mercy Moore. Nephrologist.  Chief Complaint: Fever and abdominal pain.  HPI: Mariah [name] is a 59 y.o. female with history of chronic kidney disease stage III with polycystic kidney disease, hypertension presents to the ER because of persistent abdominal pain. Patient's symptoms started 2 days ago when patient came to the ER and had CT abdomen and pelvis which did not show anything acute. After patient went home patient started developing fever chills with temperatures around 102F. Patient's nephrologist called in [place]. Despite taking which patient was still having abdominal pain and fever chills. In the ER patient's creatinine has worsened from her baseline of 2.9 - 4.6. Patient was mildly febrile. Renal sonogram and abdominal sonogram was unremarkable. Patient has been started on empiric antibiotics and admitted for fever most likely from infected renal cyst. Denies any chest pain or shortness of breath or any productive cough. Denies any diarrhea.  Review of Systems: As presented in the history of presenting illness, rest negative.  Past Medical History  Diagnosis Date  . Hypertension   . Arthritis   . Atrial fibrillation 2012  . Hyperlipidemia   . Polycystic kidney disease   . SDH (subdural hematoma)   . Gout    Past Surgical History  Procedure Laterality Date  . Radial head implant  09/24/2011    Procedure: RADIAL HEAD IMPLANT;  Surgeon: Schuyler Amor, MD;  Location: [facility];  Service: Orthopedics;  Laterality: Left;  left radial head replacement  . Orif elbow fracture  10/22/2011    Procedure: OPEN REDUCTION INTERNAL FIXATION (ORIF) ELBOW/OLECRANON FRACTURE;  Surgeon: Schuyler Amor, MD;  Location: [facility];  Service: Orthopedics;  Laterality: Left;  open  reduction  possible lateral reconstruction with palmaris graft from left or right side.  . Evacuation of subdural hematoma  11/26/2000  . Tonsillectomy    . Colonoscopy  03/2004    repeat 10 years   Social History:  reports that she quit smoking about 18 years ago. Her smoking use included Cigarettes. She has a 48 pack-year smoking history. She has never used smokeless tobacco. She reports that she drinks about 1.2 oz of alcohol per week. She reports that she does not use illicit drugs. Where does patient live home. Can patient participate in ADLs? Yes.  Allergies  Allergen Reactions  . Codeine Itching  . Morphine Itching  . Zovirax [Acyclovir Sodium] Itching    Family History:  Family History  Problem Relation Age of Onset  . Polycystic kidney disease Father   . Heart failure Father   . Heart disease Mother     Rheumatic fever      Prior to Admission medications   Medication Sig Start Date End Date Taking? Authorizing Provider  acetaminophen (TYLENOL) 500 MG tablet Take 1,000 mg by mouth every 6 (six) hours as needed for fever (pain).   Yes Historical Provider, MD  amLODipine-atorvastatin ([name]) 10-80 MG per tablet TAKE ONE TABLET BY MOUTH ONCE DAILY Patient taking differently: Take 1 tablet by mouth at bedtime.  02/24/15  Yes [name] L Breeback, PA-C  ciprofloxacin (CIPRO) 500 MG tablet Take 500 mg by mouth at bedtime. 10 day course started 06/13/15   Yes Historical Provider, MD  colchicine (COLCRYS) 0.6 MG tablet Take 1 tablet (0.6 mg total) by mouth daily. Patient taking differently: Take 0.6 mg by mouth  See admin instructions. Take 1 tablet (0.6 mg) by mouth every hour for 3 hours when needed for gout attacks, repeat 2nd day, if gout continues on 3rd day consult with doctor 10/10/14  Yes [name] L Breeback, PA-C  cyclobenzaprine (FLEXERIL) 10 MG tablet TAKE ONE TABLET BY MOUTH THREE TIMES DAILY AS NEEDED FOR MUSCLE SPASMS Patient taking differently: TAKE ONE-HALF TABLET BY MOUTH THREE  TIMES DAILY AS NEEDED FOR MUSCLE SPASMS 03/24/15  Yes Kem Boroughs, FNP  diphenhydrAMINE (BENADRYL) 25 MG tablet Take 25 mg by mouth every 4 (four) hours as needed (for itching caused by percocet).   Yes Historical Provider, MD  famotidine (PEPCID) 20 MG tablet Take 20 mg by mouth daily with lunch.    Yes Historical Provider, MD  Febuxostat (ULORIC) 80 MG TABS Take 80 mg by mouth daily with lunch.   Yes Historical Provider, MD  furosemide (LASIX) 40 MG tablet Take 40 mg by mouth daily with lunch. 05/30/15  Yes Historical Provider, MD  furosemide (LASIX) 80 MG tablet Take 80 mg by mouth daily with breakfast. 05/16/15  Yes Historical Provider, MD  loratadine (CLARITIN) 10 MG tablet Take 10 mg by mouth daily as needed (seasonal allergies). Daily with lunch   Yes Historical Provider, MD  magnesium oxide (MAG-OX) 400 MG tablet Take 2 tablets (800 mg total) by mouth at bedtime. Patient taking differently: Take 400 mg by mouth at bedtime as needed (cramping).  08/20/14  Yes Silverio Decamp, MD  metoprolol succinate (TOPROL-XL) 100 MG 24 hr tablet Take 100 mg by mouth at bedtime. 05/23/15  Yes Historical Provider, MD  Multiple Vitamin (MULTIVITAMIN WITH MINERALS) TABS tablet Take 1 tablet by mouth daily with lunch. Centrum Silver for Women   Yes Historical Provider, MD  ondansetron (ZOFRAN) 4 MG tablet Take 1 tablet (4 mg total) by mouth every 6 (six) hours. Patient taking differently: Take 4 mg by mouth every 6 (six) hours as needed for nausea or vomiting.  06/13/15  Yes Stevi Barrett, PA-C  oxyCODONE-acetaminophen (PERCOCET/ROXICET) 5-325 MG per tablet Take 1-2 tablets by mouth every 4 (four) hours as needed for severe pain. Patient taking differently: Take 1 tablet by mouth every 4 (four) hours as needed for severe pain.  06/13/15  Yes Stevi Barrett, PA-C  Probiotic Product (PHILLIPS COLON HEALTH PO) Take 1 capsule by mouth daily.   Yes Historical Provider, MD  valsartan (DIOVAN) 160 MG tablet Take 160 mg  by mouth daily.   Yes Historical Provider, MD  venlafaxine XR (EFFEXOR XR) 37.5 MG 24 hr capsule Take 2 tablets daily Patient taking differently: Take 37.5 mg by mouth daily with lunch.  08/29/14  Yes Kem Boroughs, FNP  AMBULATORY NON FORMULARY MEDICATION Knee high compression stocking for bilateral leg edema.  35-18mmHg Patient not taking: Reported on 06/15/2015 09/10/14   Royetta Car Breeback, PA-C  febuxostat (ULORIC) 40 MG tablet Take 1 tablet (40 mg total) by mouth daily. Patient not taking: Reported on 06/15/2015 09/02/14   Donella Stade, PA-C  furosemide (LASIX) 20 MG tablet Take 4 tablets (80 mg total) by mouth daily. Patient not taking: Reported on 06/15/2015 09/18/14   Donella Stade, PA-C  metoprolol succinate (TOPROL-XL) 25 MG 24 hr tablet 75mg  once daily. Patient not taking: Reported on 06/15/2015 09/18/14   Donella Stade, PA-C    Physical Exam: Filed Vitals:   06/15/15 1801 06/15/15 2200 06/15/15 2215 06/15/15 2330  BP: 126/106 106/56 109/65 117/72  Pulse: 95  85 82  Temp: 99.4  F (37.4 C)     TempSrc: Oral     Resp: 18 18 28 16   Height: 5\' 2"  (1.575 m)     Weight: 81.647 kg (180 lb)     SpO2: 94%  96% 95%     General:  Moderately built and nourished.  Eyes: Anicteric no pallor.  ENT: No discharge from the ears eyes nose or mouth.  Neck: No mass felt. No JVD appreciated.  Cardiovascular: S1 and S2 heard.  Respiratory: No rhonchi or crepitations.  Abdomen: Tenderness right upper quadrant. No guarding or rigidity.  Skin: No rash.  Musculoskeletal: No edema.  Psychiatric: Appears normal.  Neurologic: Alert awake oriented to time place and person. Moves all extremities.  Labs on Admission:  Basic Metabolic Panel:  Recent Labs Lab 06/13/15 1500 06/15/15 1820  NA 132* 130*  K 4.4 4.5  CL 100* 97*  CO2 19* 20*  GLUCOSE 124* 101*  BUN 82* 92*  CREATININE 2.91* 4.67*  CALCIUM 9.0 8.8*   Liver Function Tests:  Recent Labs Lab 06/13/15 1500  06/15/15 1820  AST 29 33  ALT 24 35  ALKPHOS 167* 207*  BILITOT 0.4 0.7  PROT 7.0 6.8  ALBUMIN 4.2 3.6    Recent Labs Lab 06/13/15 1500 06/15/15 1820  LIPASE 32 20*   No results for input(s): AMMONIA in the last 168 hours. CBC:  Recent Labs Lab 06/13/15 1500 06/15/15 1820  WBC 11.2* 8.7  HGB 10.6* 9.7*  HCT 31.5* 30.0*  MCV 85.6 87.5  PLT 239 231   Cardiac Enzymes: No results for input(s): CKTOTAL, CKMB, CKMBINDEX, TROPONINI in the last 168 hours.  BNP (last 3 results) No results for input(s): BNP in the last 8760 hours.  ProBNP (last 3 results) No results for input(s): PROBNP in the last 8760 hours.  CBG: No results for input(s): GLUCAP in the last 168 hours.  Radiological Exams on Admission: US Renal  06/16/2015   CLINICAL DATA:  59 year old female with right flank pain. History of polycystic kidney disease.  EXAM: RENAL / URINARY TRACT ULTRASOUND COMPLETE  COMPARISON:  CT dated 06/13/2015 all  FINDINGS: Right Kidney:  Length: Enlarged measuring 18.1 cm. There are innumerable cyst in the right kidney essentially replacing the renal parenchyma. The largest cyst is in the inferior pole of the right kidney measuring up to 4.3 cm. No hydronephrosis or echogenic stone.  Left Kidney:  Length: Enlarged measuring 18.6 cm. There are innumerable cysts within the left kidney replacing the left renal parenchyma. The largest fluid measures up to 5.2 cm in the inferior pole of the left kidney. There is no hydronephrosis or echogenic stone.  Bladder:  Collapsed  IMPRESSION: Enlarged polycystic kidneys compatible with known polycystic kidney disease. No hydronephrosis or echogenic stone.   Electronically Signed   By: Anner Crete M.D.   On: 06/16/2015 00:23   Dg Chest Port 1 View  06/15/2015   CLINICAL DATA:  Right flank pain. History of polycystic kidney disease. Patient was seen in the ER 2 days ago with right flank pain and noncontrast CT showed hemorrhagic cysts. Fever.  EXAM:  PORTABLE CHEST - 1 VIEW  COMPARISON:  07/06/2005  FINDINGS: Shallow inspiration with linear atelectasis in the lung bases. Normal heart size and pulmonary vascularity. No blunting of costophrenic angles. No pneumothorax. Mediastinal contours appear intact.  IMPRESSION: Shallow inspiration with linear atelectasis in the lung bases.   Electronically Signed   By: Lucienne Capers M.D.   On: 06/15/2015 23:07  Assessment/Plan Principal Problem:   Fever Active Problems:   Polycystic kidney   Abdominal pain   AKI (acute kidney injury)   Hypertension   Hyperlipidemia   1. Fever with abdominal pain - most likely from infected renal cyst. Patient has been empirically placed on vancomycin and Zosyn for now. Follow cultures. 2. Acute on chronic renal failure with history of polycystic kidney disease with metabolic acidosis - I'm holding off patient's diuretic and ARB. Continue with gentle hydration and closely follow intake output and metabolic panel. 3. Hypertension - since patient's ARB is on hold. I have placed patient on when necessary IV hydralazine along with patient's metoprolol. 4. Anemia probably from renal disease - follow CBC. 5. Hyperlipidemia on statins. 6. History of one episode of atrial fibrillation.  I have reviewed patient's old charts of labs and personally reviewed patient's chest x-ray done yesterday.   DVT Prophylaxis SCDs.  Code Status: Full code.  Family Communication: Patient's family at the bedside.  Disposition Plan: Admit to inpatient.    Viet Kemmerer N. [facility] Pager [phone].  If 7PM-7AM, please contact night-coverage [email or web address] Password [email or web address] 06/16/2015, 1:23 AM

## 2015-06-16 NOTE — Progress Notes (Signed)
Patient Demographics  Mariah [name], is a 59 y.o. female, DOB - 20-Jul-1956, ZHG:[id number]  Admit date - 06/15/2015   Admitting Physician Rise Patience, MD  Outpatient Primary MD for the patient is [facility], [name], PA-C  LOS - 0   Chief Complaint  Patient presents with  . Abdominal Pain       Admission HPI/Brief narrative:  Subjective:   Mariah [name] today has, No headache, No chest pain, complains of abdominal pain - No Nausea, No new weakness tingling or numbness, No Cough - SOB.   Assessment & Plan    Principal Problem:   Fever Active Problems:   Polycystic kidney   Abdominal pain   AKI (acute kidney injury)   Hypertension   Hyperlipidemia     Abdominal pain/fever - This is most likely related to infected/ruptured renal cyst, patient reports a [facility] where she required admission. - Continue with IV Zosyn, stop IV vancomycin - Continue with when necessary pain and nausea  medication.  Acute on chronic renal failure - Patient with known history of polycystic kidney disease, baseline creatinine  2.6- 2.8 range. presenting at 4.6 - Continue to hold ARB and diuresis  Hypertension - ARP on hold, continue with when necessary hydralazine, continue with metoprolol, blood pressure acceptable  Hyperlipidemia - Continue with statin  Anemia - Anemia of chronic renal disease  Code Status: Full  Family Communication: None at bedside  Disposition Plan: Home when stable   Procedures  None   Consults   Renal   Medications  Scheduled Meds: . atorvastatin  80 mg Oral QHS  . [START ON 06/17/2015] darbepoetin (ARANESP) injection - NON-DIALYSIS  60 mcg Subcutaneous Q Tue-1800  . famotidine  20 mg Oral Q lunch  . febuxostat  80 mg Oral Q lunch  . metoprolol succinate  100 mg Oral QHS  . piperacillin-tazobactam (ZOSYN)  IV   2.25 g Intravenous Q6H  . venlafaxine XR  37.5 mg Oral Q lunch   Continuous Infusions:  PRN Meds:.acetaminophen **OR** acetaminophen, hydrALAZINE, HYDROmorphone (DILAUDID) injection, loratadine, ondansetron **OR** ondansetron (ZOFRAN) IV  DVT Prophylaxis   SCDs   Lab Results  Component Value Date   PLT 223 06/16/2015    Antibiotics    Anti-infectives    Start     Dose/Rate Route Frequency Ordered Stop   06/18/15 0600  vancomycin (VANCOCIN) IVPB 750 mg/150 ml premix  Status:  Discontinued     750 mg 150 mL/hr over 60 Minutes Intravenous Every 48 hours 06/16/15 0342 06/16/15 1357   06/16/15 0800  piperacillin-tazobactam (ZOSYN) IVPB 2.25 g     2.25 g 100 mL/hr over 30 Minutes Intravenous Every 6 hours 06/16/15 0342     06/16/15 0100  piperacillin-tazobactam (ZOSYN) IVPB 3.375 g     3.375 g 100 mL/hr over 30 Minutes Intravenous  Once 06/16/15 0045 06/16/15 0154   06/16/15 0045  vancomycin (VANCOCIN) IVPB 1000 mg/200 mL premix     1,000 mg 200 mL/hr over 60 Minutes Intravenous  Once 06/16/15 0045 06/16/15 0224   06/16/15 0030  cefTRIAXone (ROCEPHIN) 1 g in dextrose 5 % 50 mL IVPB     1 g 100 mL/hr over 30 Minutes Intravenous  Once 06/16/15 0022  06/16/15 0136          Objective:   Filed Vitals:   06/16/15 0450 06/16/15 0843 06/16/15 0938 06/16/15 0939  BP: 101/63 102/56    Pulse: 101 92    Temp:  98.8 F (37.1 C)    TempSrc:  Oral    Resp: 18 18    Height:      Weight:      SpO2: 98% 93% 88% 92%    Wt Readings from Last 3 Encounters:  06/16/15 77.111 kg (170 lb)  06/13/15 81.647 kg (180 lb)  11/20/14 85.458 kg (188 lb 6.4 oz)     Intake/Output Summary (Last 24 hours) at 06/16/15 1547 Last data filed at 06/16/15 1518  Gross per 24 hour  Intake    910 ml  Output   1250 ml  Net   -340 ml     Physical Exam  Awake Alert, Oriented X 3, No new F.N deficits, Normal affect [place].AT,PERRAL Supple Neck,No JVD, No cervical lymphadenopathy appriciated.    Symmetrical Chest wall movement, Good air movement bilaterally, CTAB RRR,No Gallops,Rubs or new Murmurs, No Parasternal Heave +ve B.Sounds, tenderness in right abdomen area,  No organomegaly appriciated, No rebound - guarding or rigidity. No Cyanosis, Clubbing or edema, No new Rash or bruise     Data Review   Micro Results Recent Results (from the past 240 hour(s))  Culture, blood (routine x 2)     Status: None (Preliminary result)   Collection Time: 06/15/15 10:48 PM  Result Value Ref Range Status   Specimen Description BLOOD LEFT HAND  Final   Special Requests BOTTLES DRAWN AEROBIC AND ANAEROBIC 5CC EA  Final   Culture NO GROWTH < 24 HOURS  Final   Report Status PENDING  Incomplete    Radiology Reports US Renal  06/16/2015   CLINICAL DATA:  60 year old female with right flank pain. History of polycystic kidney disease.  EXAM: RENAL / URINARY TRACT ULTRASOUND COMPLETE  COMPARISON:  CT dated 06/13/2015 all  FINDINGS: Right Kidney:  Length: Enlarged measuring 18.1 cm. There are innumerable cyst in the right kidney essentially replacing the renal parenchyma. The largest cyst is in the inferior pole of the right kidney measuring up to 4.3 cm. No hydronephrosis or echogenic stone.  Left Kidney:  Length: Enlarged measuring 18.6 cm. There are innumerable cysts within the left kidney replacing the left renal parenchyma. The largest fluid measures up to 5.2 cm in the inferior pole of the left kidney. There is no hydronephrosis or echogenic stone.  Bladder:  Collapsed  IMPRESSION: Enlarged polycystic kidneys compatible with known polycystic kidney disease. No hydronephrosis or echogenic stone.   Electronically Signed   By: Anner Crete M.D.   On: 06/16/2015 00:23   US Abdomen Limited  06/16/2015   CLINICAL DATA:  Right upper quadrant pain since Friday.  EXAM: US ABDOMEN LIMITED - RIGHT UPPER QUADRANT  COMPARISON:  Ultrasound kidneys 06/15/2015. CT abdomen and pelvis 06/13/2015.  FINDINGS:  Gallbladder:  No gallstones or wall thickening visualized. No sonographic Murphy sign noted.  Common bile duct:  Diameter: 5 mm, normal  Liver:  Multiple cysts are demonstrated throughout the liver. Largest measures about 8.6 cm maximal diameter. Similar findings are demonstrated on previous CT scan. Appearance is consistent with polycystic renal and liver disease.  IMPRESSION: Normal ultrasound appearance of gallbladder and bile ducts. Polycystic liver disease.   Electronically Signed   By: Lucienne Capers M.D.   On: 06/16/2015 02:41   Dg Chest  Port 1 View  06/15/2015   CLINICAL DATA:  Right flank pain. History of polycystic kidney disease. Patient was seen in the ER 2 days ago with right flank pain and noncontrast CT showed hemorrhagic cysts. Fever.  EXAM: PORTABLE CHEST - 1 VIEW  COMPARISON:  07/06/2005  FINDINGS: Shallow inspiration with linear atelectasis in the lung bases. Normal heart size and pulmonary vascularity. No blunting of costophrenic angles. No pneumothorax. Mediastinal contours appear intact.  IMPRESSION: Shallow inspiration with linear atelectasis in the lung bases.   Electronically Signed   By: Lucienne Capers M.D.   On: 06/15/2015 23:07   Ct Renal Stone Study  06/13/2015   CLINICAL DATA:  RIGHT upper quadrant and RIGHT flank pain, nausea, history polycystic kidney disease ; patient has says symptoms feel similar to prior episode of infected renal cyst ; past history hypertension, atrial fibrillation, former smoker  EXAM: CT ABDOMEN AND PELVIS WITHOUT CONTRAST  TECHNIQUE: Multidetector CT imaging of the abdomen and pelvis was performed following the standard protocol without IV contrast. Sagittal and coronal MPR images reconstructed from axial data set.  COMPARISON:  None  FINDINGS: Minimal bibasilar atelectasis.  Innumerable cysts throughout both kidneys compatible with polycystic kidney disease.  Some of the observed cysts are intermediate and high in attenuation bilaterally.  No definite  ureteral calcification or hydroureteronephrosis.  Bladder decompressed and ureters unremarkable.  Numerous hepatic cysts largest 8.1 x 7.0 cm image 15.  No significant abnormalities of the spleen, pancreas, or adrenal glands.  Stomach and small bowel loops unremarkable.  Normal appendix.  Sigmoid diverticulosis.  Stomach and bowel loops otherwise normal.  Scattered atherosclerotic calcification.  No mass, adenopathy, free air or free fluid.  Atrophic uterus with tiny anterior leiomyoma.  Adnexa unremarkable.  Mild degenerative disc disease changes L5-S1.  IMPRESSION: Numerous cysts throughout the kidneys compatible apply cystic kidney disease.  Some of the observed nodules are intermediate to high in attenuation compatible likely representing complicated cysts with prior hemorrhage, though solid nodules are not excluded ; these could be evaluated by followup MR imaging to characterize.  Extensive hepatic cystic disease.  Sigmoid diverticulosis.  No definite acute intra-abdominal or intrapelvic abnormalities.   Electronically Signed   By: Lavonia Dana M.D.   On: 06/13/2015 16:58     CBC  Recent Labs Lab 06/13/15 1500 06/15/15 1820 06/16/15 0323  WBC 11.2* 8.7 5.4  HGB 10.6* 9.7* 8.7*  HCT 31.5* 30.0* 26.6*  PLT 239 231 223  MCV 85.6 87.5 86.1  MCH 28.8 28.3 28.2  MCHC 33.7 32.3 32.7  RDW 12.4 13.0 12.9  LYMPHSABS  --   --  0.8  MONOABS  --   --  0.2  EOSABS  --   --  0.1  BASOSABS  --   --  0.0    Chemistries   Recent Labs Lab 06/13/15 1500 06/15/15 1820 06/16/15 0323  NA 132* 130* 128*  K 4.4 4.5 4.0  CL 100* 97* 96*  CO2 19* 20* 19*  GLUCOSE 124* 101* 103*  BUN 82* 92* 93*  CREATININE 2.91* 4.67* 4.59*  CALCIUM 9.0 8.8* 8.2*  AST 29 33 28  ALT 24 35 32  ALKPHOS 167* 207* 205*  BILITOT 0.4 0.7 1.0   ------------------------------------------------------------------------------------------------------------------ estimated creatinine clearance is 12.5 mL/min (by C-G formula  based on Cr of 4.59). ------------------------------------------------------------------------------------------------------------------ No results for input(s): HGBA1C in the last 72 hours. ------------------------------------------------------------------------------------------------------------------ No results for input(s): CHOL, HDL, LDLCALC, TRIG, CHOLHDL, LDLDIRECT in the last 72 hours. ------------------------------------------------------------------------------------------------------------------  No results for input(s): TSH, T4TOTAL, T3FREE, THYROIDAB in the last 72 hours.  Invalid input(s): FREET3 ------------------------------------------------------------------------------------------------------------------ No results for input(s): VITAMINB12, FOLATE, FERRITIN, TIBC, IRON, RETICCTPCT in the last 72 hours.  Coagulation profile No results for input(s): INR, PROTIME in the last 168 hours.  No results for input(s): DDIMER in the last 72 hours.  Cardiac Enzymes No results for input(s): CKMB, TROPONINI, MYOGLOBIN in the last 168 hours.  Invalid input(s): CK ------------------------------------------------------------------------------------------------------------------ Invalid input(s): POCBNP     Time Spent in minutes   30 minutes   Charleton Deyoung M.D on 06/16/2015 at 3:47 PM  Between 7am to 7pm - Pager - [phone]  After 7pm go to [email or web address] - password [facility]  [facility]   Office  [phone]

## 2015-06-17 DIAGNOSIS — I10 Essential (primary) hypertension: Secondary | ICD-10-CM

## 2015-06-17 DIAGNOSIS — N179 Acute kidney failure, unspecified: Secondary | ICD-10-CM

## 2015-06-17 DIAGNOSIS — R109 Unspecified abdominal pain: Secondary | ICD-10-CM

## 2015-06-17 LAB — IRON AND TIBC
Iron: 28 ug/dL (ref 28–170)
Saturation Ratios: 12 % (ref 10.4–31.8)
TIBC: 230 ug/dL — ABNORMAL LOW (ref 250–450)
UIBC: 202 ug/dL

## 2015-06-17 LAB — RENAL FUNCTION PANEL
ALBUMIN: 3.2 g/dL — AB (ref 3.5–5.0)
Anion gap: 14 (ref 5–15)
BUN: 79 mg/dL — AB (ref 6–20)
CALCIUM: 8.9 mg/dL (ref 8.9–10.3)
CO2: 21 mmol/L — ABNORMAL LOW (ref 22–32)
CREATININE: 4.04 mg/dL — AB (ref 0.44–1.00)
Chloride: 103 mmol/L (ref 101–111)
GFR calc Af Amer: 13 mL/min — ABNORMAL LOW (ref >60)
GFR, EST NON AFRICAN AMERICAN: 11 mL/min — AB (ref >60)
Glucose, Bld: 93 mg/dL (ref 65–99)
PHOSPHORUS: 6.2 mg/dL — AB (ref 2.5–4.6)
Potassium: 4.5 mmol/L (ref 3.5–5.1)
Sodium: 138 mmol/L (ref 135–145)

## 2015-06-17 LAB — CBC
HEMATOCRIT: 26.4 % — AB (ref 36.0–46.0)
HEMOGLOBIN: 8.6 g/dL — AB (ref 12.0–15.0)
MCH: 28.3 pg (ref 26.0–34.0)
MCHC: 32.6 g/dL (ref 30.0–36.0)
MCV: 86.8 fL (ref 78.0–100.0)
Platelets: 264 10*3/uL (ref 150–400)
RBC: 3.04 MIL/uL — ABNORMAL LOW (ref 3.87–5.11)
RDW: 12.9 % (ref 11.5–15.5)
WBC: 6.8 10*3/uL (ref 4.0–10.5)

## 2015-06-17 LAB — URINE CULTURE

## 2015-06-17 LAB — FERRITIN: FERRITIN: 824 ng/mL — AB (ref 11–307)

## 2015-06-17 MED ORDER — CIPROFLOXACIN HCL 500 MG PO TABS
500.0000 mg | ORAL_TABLET | Freq: Every day | ORAL | Status: AC
Start: 2015-06-17 — End: 2015-06-23

## 2015-06-17 MED ORDER — NA FERRIC GLUC CPLX IN SUCROSE 12.5 MG/ML IV SOLN
125.0000 mg | Freq: Once | INTRAVENOUS | Status: AC
  Administered 2015-06-17: 125 mg via INTRAVENOUS
  Filled 2015-06-17: qty 10

## 2015-06-17 MED ORDER — HYDROMORPHONE HCL 2 MG PO TABS: 1.0000 mg | ORAL_TABLET | Freq: Four times a day (QID) | ORAL | Status: DC | PRN

## 2015-06-17 MED ORDER — COLCHICINE 0.6 MG PO TABS: 0.6000 mg | ORAL_TABLET | ORAL | Status: DC

## 2015-06-17 MED ORDER — ATORVASTATIN CALCIUM 80 MG PO TABS: 80.0000 mg | ORAL_TABLET | Freq: Every day | ORAL | Status: DC

## 2015-06-17 NOTE — Progress Notes (Signed)
Utilization review completed. Arleigh Dicola, RN, BSN. 

## 2015-06-17 NOTE — Progress Notes (Signed)
Subjective:  Good UOP creatinine down- BP is good off amlodipine Objective Vital signs in last 24 hours: Filed Vitals:   06/16/15 0939 06/16/15 2023 06/17/15 0541 06/17/15 0859  BP:  113/56 111/58 109/63  Pulse:  96 86 88  Temp:  98.6 F (37 C) 98.3 F (36.8 C) 98.3 F (36.8 C)  TempSrc:    Oral  Resp:  18 18 18   Height:      Weight:  83.462 kg (184 lb)    SpO2: 92% 95% 97% 96%   Weight change: 1.814 kg (4 lb)  Intake/Output Summary (Last 24 hours) at 06/17/15 0948 Last data filed at 06/17/15 0859  Gross per 24 hour  Intake   1150 ml  Output   2850 ml  Net  -1700 ml   Assessment/Plan: 59 year old white female with polycystic kidney disease and fairly advanced CKD at baseline. She presents with abdominal pain presumed kidney cyst rupture also with fever and acute on chronic renal failure  1. Renal- acute on chronic renal failure. Her baseline creatinine seems to be in the 2.6- 2.8 range. Now presenting at 4.6- is 4 today. She reported decreased urine output transiently  but now it is picking up. I suspect her acute kidney injury is a result of this ruptured/infected renal cyst. There also could be an element of ATN with decreased blood pressure. She does not appear uremic at this time. There are no dialysis indications. Renal function improving 2. Hypertension/volume - her blood pressure is borderline. The patient and her spouse notice that she is retaining more fluid. She had been started on IV fluids and her Lasix is on hold. I stopped IVF.  Regarding her low blood pressure I'm going to continue to  hold her amlodipine.She can monitor her blood pressure at home and will call us if it goes higher 3. renal cyst pathology - the presumed diagnosis is a ruptured renal cyst with possible infection. Her urinalysis is clear. She is on antibiotics. To transition to PO cipro.  Pain control is an issue as well but they think they can handle with PO meds 4. Anemia- hemoglobin dropping with  hydration- will give iron and also gave a dose of ESA. 5. Dispo- patient is asking about going home. I think is OK- would send out on cipro 500 daily for another week- I will arrange follow up labs at end of week at our office   [place]: Basic Metabolic Panel:  Recent Labs Lab 06/15/15 1820 06/16/15 0323 06/17/15 0530  NA 130* 128* 138  K 4.5 4.0 4.5  CL 97* 96* 103  CO2 20* 19* 21*  GLUCOSE 101* 103* 93  BUN 92* 93* 79*  CREATININE 4.67* 4.59* 4.04*  CALCIUM 8.8* 8.2* 8.9  PHOS  --   --  6.2*   Liver Function Tests:  Recent Labs Lab 06/13/15 1500 06/15/15 1820 06/16/15 0323 06/17/15 0530  AST 29 33 28  --   ALT 24 35 32  --   ALKPHOS 167* 207* 205*  --   BILITOT 0.4 0.7 1.0  --   PROT 7.0 6.8 6.5  --   ALBUMIN 4.2 3.6 3.2* 3.2*    Recent Labs Lab 06/13/15 1500 06/15/15 1820  LIPASE 32 20*   No results for input(s): AMMONIA in the last 168 hours. CBC:  Recent Labs Lab 06/13/15 1500 06/15/15 1820 06/16/15 0323 06/17/15 0530  WBC 11.2* 8.7 5.4 6.8  NEUTROABS  --   --  4.2  --   HGB 10.6* 9.7* 8.7* 8.6*  HCT 31.5* 30.0* 26.6* 26.4*  MCV 85.6 87.5 86.1 86.8  PLT 239 231 223 264   Cardiac Enzymes: No results for input(s): CKTOTAL, CKMB, CKMBINDEX, TROPONINI in the last 168 hours. CBG: No results for input(s): GLUCAP in the last 168 hours.  Iron Studies:  Recent Labs  06/17/15 0530  IRON 28  TIBC 230*  FERRITIN 824*   Studies/Results: US Renal  06/16/2015   CLINICAL DATA:  59 year old female with right flank pain. History of polycystic kidney disease.  EXAM: RENAL / URINARY TRACT ULTRASOUND COMPLETE  COMPARISON:  CT dated 06/13/2015 all  FINDINGS: Right Kidney:  Length: Enlarged measuring 18.1 cm. There are innumerable cyst in the right kidney essentially replacing the renal parenchyma. The largest cyst is in the inferior pole of the right kidney measuring up to 4.3 cm. No hydronephrosis or echogenic stone.  Left Kidney:   Length: Enlarged measuring 18.6 cm. There are innumerable cysts within the left kidney replacing the left renal parenchyma. The largest fluid measures up to 5.2 cm in the inferior pole of the left kidney. There is no hydronephrosis or echogenic stone.  Bladder:  Collapsed  IMPRESSION: Enlarged polycystic kidneys compatible with known polycystic kidney disease. No hydronephrosis or echogenic stone.   Electronically Signed   By: Anner Crete M.D.   On: 06/16/2015 00:23   US Abdomen Limited  06/16/2015   CLINICAL DATA:  Right upper quadrant pain since Friday.  EXAM: US ABDOMEN LIMITED - RIGHT UPPER QUADRANT  COMPARISON:  Ultrasound kidneys 06/15/2015. CT abdomen and pelvis 06/13/2015.  FINDINGS: Gallbladder:  No gallstones or wall thickening visualized. No sonographic Murphy sign noted.  Common bile duct:  Diameter: 5 mm, normal  Liver:  Multiple cysts are demonstrated throughout the liver. Largest measures about 8.6 cm maximal diameter. Similar findings are demonstrated on previous CT scan. Appearance is consistent with polycystic renal and liver disease.  IMPRESSION: Normal ultrasound appearance of gallbladder and bile ducts. Polycystic liver disease.   Electronically Signed   By: Lucienne Capers M.D.   On: 06/16/2015 02:41   Dg Chest Port 1 View  06/15/2015   CLINICAL DATA:  Right flank pain. History of polycystic kidney disease. Patient was seen in the ER 2 days ago with right flank pain and noncontrast CT showed hemorrhagic cysts. Fever.  EXAM: PORTABLE CHEST - 1 VIEW  COMPARISON:  07/06/2005  FINDINGS: Shallow inspiration with linear atelectasis in the lung bases. Normal heart size and pulmonary vascularity. No blunting of costophrenic angles. No pneumothorax. Mediastinal contours appear intact.  IMPRESSION: Shallow inspiration with linear atelectasis in the lung bases.   Electronically Signed   By: Lucienne Capers M.D.   On: 06/15/2015 23:07   Medications: Infusions:    Scheduled Medications: .  atorvastatin  80 mg Oral QHS  . darbepoetin (ARANESP) injection - NON-DIALYSIS  60 mcg Subcutaneous Q Tue-1800  . famotidine  20 mg Oral Q lunch  . febuxostat  80 mg Oral Q lunch  . metoprolol succinate  100 mg Oral QHS  . piperacillin-tazobactam (ZOSYN)  IV  2.25 g Intravenous Q6H  . venlafaxine XR  37.5 mg Oral Q lunch    have reviewed scheduled and prn medications.  Physical Exam: General: NAD Heart: RRR Lungs: clear Abdomen: soft- tender Extremities: minimal edema    06/17/2015,9:48 AM  LOS: 1 day

## 2015-06-17 NOTE — Care Management Note (Signed)
Case Management Note  Patient Details  Name: Mariah [name] MRN: [id number] Date of Birth: 08/12/1956  Subjective/Objective:          CM following for progression and d/c planning.          Action/Plan: No HH needs identified.   Expected Discharge Date:       06/18/2015           Expected Discharge Plan:  Home/Self Care  In-House Referral:  NA  Discharge planning Services  NA  Post Acute Care Choice:  NA Choice offered to:  NA  DME Arranged:  N/A DME Agency:     HH Arranged:  NA HH Agency:     Status of Service:  Completed, signed off  Medicare Important Message Given:    Date Medicare IM Given:    Medicare IM give by:    Date Additional Medicare IM Given:    Additional Medicare Important Message give by:     If discussed at [place] of Stay Meetings, dates discussed:    Additional Comments:  Adron Bene, RN 06/17/2015, 10:03 AM

## 2015-06-17 NOTE — Progress Notes (Signed)
Patient discharged with significant other. IV discontinued to L thumb, catheter intact. Reviewed instructions and discharge follow up appointments. Patient given prescriptions. Verbalized understanding. Escorted out by significant other via wheelchair. Declined staff assistance to car. Haidee Stogsdill, Bryn Gulling

## 2015-06-17 NOTE — Discharge Summary (Signed)
Mariah [name], is a 59 y.o. female  DOB 04-23-1956  MRN [id number].  Admission date:  06/15/2015  Admitting Physician  Rise Patience, MD  Discharge Date:  06/17/2015   Primary MD  Iran Planas, PA-C  Recommendations for primary care physician for things to follow:  - please check CBC, BMP during next visit.   Admission Diagnosis  Right upper quadrant pain [R10.11]   Discharge Diagnosis  Right upper quadrant pain [R10.11]    Principal Problem:   Fever Active Problems:   Polycystic kidney   Abdominal pain   AKI (acute kidney injury)   Hypertension   Hyperlipidemia      Past Medical History  Diagnosis Date  . Hypertension   . Arthritis   . Atrial fibrillation 2012  . Hyperlipidemia   . Polycystic kidney disease   . SDH (subdural hematoma)   . Gout     Past Surgical History  Procedure Laterality Date  . Radial head implant  09/24/2011    Procedure: RADIAL HEAD IMPLANT;  Surgeon: Schuyler Amor, MD;  Location: [facility];  Service: Orthopedics;  Laterality: Left;  left radial head replacement  . Orif elbow fracture  10/22/2011    Procedure: OPEN REDUCTION INTERNAL FIXATION (ORIF) ELBOW/OLECRANON FRACTURE;  Surgeon: Schuyler Amor, MD;  Location: [facility];  Service: Orthopedics;  Laterality: Left;  open reduction  possible lateral reconstruction with palmaris graft from left or right side.  . Evacuation of subdural hematoma  11/26/2000  . Tonsillectomy    . Colonoscopy  03/2004    repeat 10 years       History of present illness and  Hospital Course:     Kindly see H&P for history of present illness and admission details, please review complete Labs, Consult reports and Test reports for all details in brief  HPI  from the history and physical done on the day of admission Mariah [name] is a 59 y.o. female with history of chronic kidney  disease stage III with polycystic kidney disease, hypertension presents to the ER because of persistent abdominal pain. Patient's symptoms started 2 days ago when patient came to the ER and had CT abdomen and pelvis which did not show anything acute. After patient went home patient started developing fever chills with temperatures around 102F. Patient's nephrologist called in [place]. Despite taking which patient was still having abdominal pain and fever chills. In the ER patient's creatinine has worsened from her baseline of 2.9 - 4.6. Patient was mildly febrile. Renal sonogram and abdominal sonogram was unremarkable. Patient has been started on empiric antibiotics and admitted for fever most likely from infected renal cyst. Denies any chest pain or shortness of breath or any productive cough. Denies any diarrhea.   Hospital Course   Abdominal pain/fever - This is most likely related to infected/ruptured renal cyst, patient reports a similar presentation last year at [facility] last year where she required admission. - Treated with IV Zosyn during hospital stay, will be  discharged on oral ciprofloxacin. - Pain is currently controlled, afebrile, leukocytosis  Acute on chronic renal failure - Patient with known history of polycystic kidney disease, baseline creatinine 2.6- 2.8 range. presenting at 4.6, and today is 4. - Continue to hold ARB and diuresis on discharge. - nephrology  will have follow up labs arranged as outpatient.  Hypertension - Continue to hold  losartan and Norvasc ,cont with metoprolol  Hyperlipidemia - Continue with statin  Anemia - Anemia of chronic renal disease, received IV iron, and ESA    Discharge Condition:  stable   Follow UP  Follow-up Information    Follow up with BREEBACK, [name], PA-C. Call in 1 week.   Specialty:  Family Medicine   Why:  Posthospitalization follow-up   Contact information:   [address] [place]  [address] [phone]         Discharge Instructions  and  Discharge Medications     Discharge Instructions    Discharge instructions    Complete by:  As directed   Follow with Primary MD BREEBACK, [name], PA-C in 7 days   Get CBC, CMP,checked  by Primary MD next visit.    Activity: As tolerated with Full fall precautions use walker/cane & assistance as needed   Disposition Home    Diet: Heart Healthy , renal modified , with feeding assistance and aspiration precautions.  For Heart failure patients - Check your Weight same time everyday, if you gain over 2 pounds, or you develop in leg swelling, experience more shortness of breath or chest pain, call your Primary MD immediately. Follow Cardiac Low Salt Diet and 1.5 lit/day fluid restriction.   On your next visit with your primary care physician please Get Medicines reviewed and adjusted.   Please request your Prim.MD to go over all Hospital Tests and Procedure/Radiological results at the follow up, please get all Hospital records sent to your Prim MD by signing hospital release before you go home.   If you experience worsening of your admission symptoms, develop shortness of breath, life threatening emergency, suicidal or homicidal thoughts you must seek medical attention immediately by calling 911 or calling your MD immediately  if symptoms less severe.  You Must read complete instructions/literature along with all the possible adverse reactions/side effects for all the Medicines you take and that have been prescribed to you. Take any new Medicines after you have completely understood and accpet all the possible adverse reactions/side effects.   Do not drive, operating heavy machinery, perform activities at heights, swimming or participation in water activities or provide baby sitting services if your were admitted for syncope or siezures until you have seen by Primary MD or a Neurologist and advised to do so again.  Do not drive  when taking Pain medications.    Do not take more than prescribed Pain, Sleep and Anxiety Medications  Special Instructions: If you have smoked or chewed Tobacco  in the last 2 yrs please stop smoking, stop any regular Alcohol  and or any Recreational drug use.  Wear Seat belts while driving.   Please note  You were cared for by a hospitalist during your hospital stay. If you have any questions about your discharge medications or the care you received while you were in the hospital after you are discharged, you can call the unit and asked to speak with the hospitalist on call if the hospitalist that took care of you is not available. Once you are discharged,  your primary care physician will handle any further medical issues. Please note that NO REFILLS for any discharge medications will be authorized once you are discharged, as it is imperative that you return to your primary care physician (or establish a relationship with a primary care physician if you do not have one) for your aftercare needs so that they can reassess your need for medications and monitor your lab values.     Increase activity slowly    Complete by:  As directed             Medication List    STOP taking these medications        amLODipine-atorvastatin 10-80 MG per tablet  Commonly known as:  [name]     oxyCODONE-acetaminophen 5-325 MG per tablet  Commonly known as:  PERCOCET/ROXICET     valsartan 160 MG tablet  Commonly known as:  DIOVAN      TAKE these medications        acetaminophen 500 MG tablet  Commonly known as:  TYLENOL  Take 1,000 mg by mouth every 6 (six) hours as needed for fever (pain).     AMBULATORY NON FORMULARY MEDICATION  Knee high compression stocking for bilateral leg edema.  35-54mmHg     atorvastatin 80 MG tablet  Commonly known as:  LIPITOR  Take 1 tablet (80 mg total) by mouth at bedtime.     ciprofloxacin 500 MG tablet  Commonly known as:  CIPRO  Take 1 tablet (500 mg  total) by mouth at bedtime. Please take for total of 7 days     colchicine 0.6 MG tablet  Commonly known as:  COLCRYS  Take 1 tablet (0.6 mg total) by mouth See admin instructions. Take 1 tablet (0.6 mg) by mouth every hour for 3 hours when needed for gout attacks, repeat 2nd day, if gout continues on 3rd day consult with doctor     cyclobenzaprine 10 MG tablet  Commonly known as:  FLEXERIL  TAKE ONE TABLET BY MOUTH THREE TIMES DAILY AS NEEDED FOR MUSCLE SPASMS     diphenhydrAMINE 25 MG tablet  Commonly known as:  BENADRYL  Take 25 mg by mouth every 4 (four) hours as needed (for itching caused by percocet).     famotidine 20 MG tablet  Commonly known as:  PEPCID  Take 20 mg by mouth daily with lunch.     furosemide 20 MG tablet  Commonly known as:  LASIX  Take 4 tablets (80 mg total) by mouth daily.     HYDROmorphone 2 MG tablet  Commonly known as:  DILAUDID  Take 0.5 tablets (1 mg total) by mouth every 6 (six) hours as needed for severe pain.     loratadine 10 MG tablet  Commonly known as:  CLARITIN  Take 10 mg by mouth daily as needed (seasonal allergies). Daily with lunch     magnesium oxide 400 MG tablet  Commonly known as:  MAG-OX  Take 2 tablets (800 mg total) by mouth at bedtime.     metoprolol succinate 100 MG 24 hr tablet  Commonly known as:  TOPROL-XL  Take 100 mg by mouth at bedtime.     multivitamin with minerals Tabs tablet  Take 1 tablet by mouth daily with lunch. Centrum Silver for Women     ondansetron 4 MG tablet  Commonly known as:  ZOFRAN  Take 1 tablet (4 mg total) by mouth every 6 (six) hours.     [place]  Take 1 capsule by mouth daily.     ULORIC 80 MG Tabs  Generic drug:  Febuxostat  Take 80 mg by mouth daily with lunch.     venlafaxine XR 37.5 MG 24 hr capsule  Commonly known as:  EFFEXOR XR  Take 2 tablets daily          Diet and Activity recommendation: See Discharge Instructions above   Consults obtained -    nephrology   Major procedures and Radiology Reports - PLEASE review detailed and final reports for all details, in brief -   none   US Renal  06/16/2015   CLINICAL DATA:  59 year old female with right flank pain. History of polycystic kidney disease.  EXAM: RENAL / URINARY TRACT ULTRASOUND COMPLETE  COMPARISON:  CT dated 06/13/2015 all  FINDINGS: Right Kidney:  Length: Enlarged measuring 18.1 cm. There are innumerable cyst in the right kidney essentially replacing the renal parenchyma. The largest cyst is in the inferior pole of the right kidney measuring up to 4.3 cm. No hydronephrosis or echogenic stone.  Left Kidney:  Length: Enlarged measuring 18.6 cm. There are innumerable cysts within the left kidney replacing the left renal parenchyma. The largest fluid measures up to 5.2 cm in the inferior pole of the left kidney. There is no hydronephrosis or echogenic stone.  Bladder:  Collapsed  IMPRESSION: Enlarged polycystic kidneys compatible with known polycystic kidney disease. No hydronephrosis or echogenic stone.   Electronically Signed   By: Anner Crete M.D.   On: 06/16/2015 00:23   US Abdomen Limited  06/16/2015   CLINICAL DATA:  Right upper quadrant pain since Friday.  EXAM: US ABDOMEN LIMITED - RIGHT UPPER QUADRANT  COMPARISON:  Ultrasound kidneys 06/15/2015. CT abdomen and pelvis 06/13/2015.  FINDINGS: Gallbladder:  No gallstones or wall thickening visualized. No sonographic Murphy sign noted.  Common bile duct:  Diameter: 5 mm, normal  Liver:  Multiple cysts are demonstrated throughout the liver. Largest measures about 8.6 cm maximal diameter. Similar findings are demonstrated on previous CT scan. Appearance is consistent with polycystic renal and liver disease.  IMPRESSION: Normal ultrasound appearance of gallbladder and bile ducts. Polycystic liver disease.   Electronically Signed   By: Lucienne Capers M.D.   On: 06/16/2015 02:41   Dg Chest Port 1 View  06/15/2015   CLINICAL DATA:  Right  flank pain. History of polycystic kidney disease. Patient was seen in the ER 2 days ago with right flank pain and noncontrast CT showed hemorrhagic cysts. Fever.  EXAM: PORTABLE CHEST - 1 VIEW  COMPARISON:  07/06/2005  FINDINGS: Shallow inspiration with linear atelectasis in the lung bases. Normal heart size and pulmonary vascularity. No blunting of costophrenic angles. No pneumothorax. Mediastinal contours appear intact.  IMPRESSION: Shallow inspiration with linear atelectasis in the lung bases.   Electronically Signed   By: Lucienne Capers M.D.   On: 06/15/2015 23:07   Ct Renal Stone Study  06/13/2015   CLINICAL DATA:  RIGHT upper quadrant and RIGHT flank pain, nausea, history polycystic kidney disease ; patient has says symptoms feel similar to prior episode of infected renal cyst ; past history hypertension, atrial fibrillation, former smoker  EXAM: CT ABDOMEN AND PELVIS WITHOUT CONTRAST  TECHNIQUE: Multidetector CT imaging of the abdomen and pelvis was performed following the standard protocol without IV contrast. Sagittal and coronal MPR images reconstructed from axial data set.  COMPARISON:  None  FINDINGS: Minimal bibasilar atelectasis.  Innumerable cysts throughout both kidneys compatible with polycystic kidney  disease.  Some of the observed cysts are intermediate and high in attenuation bilaterally.  No definite ureteral calcification or hydroureteronephrosis.  Bladder decompressed and ureters unremarkable.  Numerous hepatic cysts largest 8.1 x 7.0 cm image 15.  No significant abnormalities of the spleen, pancreas, or adrenal glands.  Stomach and small bowel loops unremarkable.  Normal appendix.  Sigmoid diverticulosis.  Stomach and bowel loops otherwise normal.  Scattered atherosclerotic calcification.  No mass, adenopathy, free air or free fluid.  Atrophic uterus with tiny anterior leiomyoma.  Adnexa unremarkable.  Mild degenerative disc disease changes L5-S1.  IMPRESSION: Numerous cysts throughout  the kidneys compatible apply cystic kidney disease.  Some of the observed nodules are intermediate to high in attenuation compatible likely representing complicated cysts with prior hemorrhage, though solid nodules are not excluded ; these could be evaluated by followup MR imaging to characterize.  Extensive hepatic cystic disease.  Sigmoid diverticulosis.  No definite acute intra-abdominal or intrapelvic abnormalities.   Electronically Signed   By: Lavonia Dana M.D.   On: 06/13/2015 16:58    Micro Results    Recent Results (from the past 240 hour(s))  Urine culture     Status: None   Collection Time: 06/15/15 10:40 PM  Result Value Ref Range Status   Specimen Description URINE, CLEAN CATCH  Final   Special Requests NONE  Final   Culture MULTIPLE SPECIES PRESENT, SUGGEST RECOLLECTION  Final   Report Status 06/17/2015 FINAL  Final  Culture, blood (routine x 2)     Status: None (Preliminary result)   Collection Time: 06/15/15 10:48 PM  Result Value Ref Range Status   Specimen Description BLOOD LEFT HAND  Final   Special Requests BOTTLES DRAWN AEROBIC AND ANAEROBIC 5CC EA  Final   Culture NO GROWTH < 24 HOURS  Final   Report Status PENDING  Incomplete       Today   Subjective:   Mariah [name] today has no headache,no chest abdominal pain,no new weakness tingling or numbness, feels much better wants to go home today.   Objective:   Blood pressure 109/63, pulse 88, temperature 98.3 F (36.8 C), temperature source Oral, resp. rate 18, height 5\' 2"  (1.575 m), weight 83.462 kg (184 lb), last menstrual period 07/11/2009, SpO2 96 %.   Intake/Output Summary (Last 24 hours) at 06/17/15 1113 Last data filed at 06/17/15 0859  Gross per 24 hour  Intake    900 ml  Output   2475 ml  Net  -1575 ml    Exam Awake Alert, Oriented x 3, No new F.N deficits, Normal affect [place].AT,PERRAL Supple Neck,No JVD, No cervical lymphadenopathy appriciated.  Symmetrical Chest wall movement, Good air  movement bilaterally,  RRR,No Gallops,Rubs or new Murmurs, No Parasternal Heave +ve B.Sounds, Abd Soft, Non tender, No organomegaly appriciated, No rebound -guarding or rigidity. No Cyanosis, Clubbing or edema, No new Rash or bruise  Data Review   CBC w Diff: Lab Results  Component Value Date   WBC 6.8 06/17/2015   HGB 8.6* 06/17/2015   HCT 26.4* 06/17/2015   PLT 264 06/17/2015   LYMPHOPCT 14 06/16/2015   MONOPCT 4 06/16/2015   EOSPCT 2 06/16/2015   BASOPCT 0 06/16/2015    CMP: Lab Results  Component Value Date   NA 138 06/17/2015   NA 135* 02/14/2015   K 4.5 06/17/2015   CL 103 06/17/2015   CL 99 02/14/2015   CO2 21* 06/17/2015   BUN 79* 06/17/2015   BUN 27* 08/04/2011  CREATININE 4.04* 06/17/2015   CREATININE 2.7* 02/14/2015   CREATININE 2.77* 09/10/2014   GLU 96 02/14/2015   PROT 6.5 06/16/2015   PROT 24.4 02/14/2015   ALBUMIN 3.2* 06/17/2015   ALBUMIN 4.7 02/14/2015   BILITOT 1.0 06/16/2015   ALKPHOS 205* 06/16/2015   AST 28 06/16/2015   ALT 32 06/16/2015  .   Total Time in preparing paper work, data evaluation and todays exam - 35 minutes  ELGERGAWY, DAWOOD M.D on 06/17/2015 at [facility]  [phone]

## 2015-06-17 NOTE — Discharge Instructions (Signed)
Follow with Primary MD BREEBACK, [name], PA-C in 7 days   Get CBC, CMP,checked  by Primary MD next visit.    Activity: As tolerated with Full fall precautions use walker/cane & assistance as needed   Disposition Home    Diet: Heart Healthy , renal modified , with feeding assistance and aspiration precautions.  For Heart failure patients - Check your Weight same time everyday, if you gain over 2 pounds, or you develop in leg swelling, experience more shortness of breath or chest pain, call your Primary MD immediately. Follow Cardiac Low Salt Diet and 1.5 lit/day fluid restriction.   On your next visit with your primary care physician please Get Medicines reviewed and adjusted.   Please request your Prim.MD to go over all Hospital Tests and Procedure/Radiological results at the follow up, please get all Hospital records sent to your Prim MD by signing hospital release before you go home.   If you experience worsening of your admission symptoms, develop shortness of breath, life threatening emergency, suicidal or homicidal thoughts you must seek medical attention immediately by calling 911 or calling your MD immediately  if symptoms less severe.  You Must read complete instructions/literature along with all the possible adverse reactions/side effects for all the Medicines you take and that have been prescribed to you. Take any new Medicines after you have completely understood and accpet all the possible adverse reactions/side effects.   Do not drive, operating heavy machinery, perform activities at heights, swimming or participation in water activities or provide baby sitting services if your were admitted for syncope or siezures until you have seen by Primary MD or a Neurologist and advised to do so again.  Do not drive when taking Pain medications.    Do not take more than prescribed Pain, Sleep and Anxiety Medications  Special Instructions: If you have smoked or chewed Tobacco  in the  last 2 yrs please stop smoking, stop any regular Alcohol  and or any Recreational drug use.  Wear Seat belts while driving.   Please note  You were cared for by a hospitalist during your hospital stay. If you have any questions about your discharge medications or the care you received while you were in the hospital after you are discharged, you can call the unit and asked to speak with the hospitalist on call if the hospitalist that took care of you is not available. Once you are discharged, your primary care physician will handle any further medical issues. Please note that NO REFILLS for any discharge medications will be authorized once you are discharged, as it is imperative that you return to your primary care physician (or establish a relationship with a primary care physician if you do not have one) for your aftercare needs so that they can reassess your need for medications and monitor your lab values.

## 2015-06-20 LAB — CULTURE, BLOOD (ROUTINE X 2): CULTURE: NO GROWTH

## 2015-06-21 LAB — CULTURE, BLOOD (ROUTINE X 2): Culture: NO GROWTH

## 2015-06-23 ENCOUNTER — Encounter: Payer: Self-pay | Admitting: Physician Assistant

## 2015-06-25 ENCOUNTER — Ambulatory Visit: Payer: BLUE CROSS/BLUE SHIELD | Admitting: Physician Assistant

## 2015-06-30 ENCOUNTER — Other Ambulatory Visit: Payer: Self-pay | Admitting: Nurse Practitioner

## 2015-06-30 NOTE — Telephone Encounter (Signed)
Medication refill request: Effexor  Last AEX:  08/29/14 PG Next AEX: 09/09/15 PG Last MMG (if hormonal medication request): 09/11/14 [id number]:Neg Refill authorized: 08/29/14 #120caps/3R To [facility]. Today #120/0R?

## 2015-07-09 ENCOUNTER — Encounter: Payer: Self-pay | Admitting: Cardiology

## 2015-07-09 ENCOUNTER — Ambulatory Visit (INDEPENDENT_AMBULATORY_CARE_PROVIDER_SITE_OTHER): Payer: BLUE CROSS/BLUE SHIELD | Admitting: Cardiology

## 2015-07-09 VITALS — BP 112/68 | HR 92 | Ht 62.0 in | Wt 178.1 lb

## 2015-07-09 DIAGNOSIS — R002 Palpitations: Secondary | ICD-10-CM | POA: Diagnosis not present

## 2015-07-09 DIAGNOSIS — N183 Chronic kidney disease, stage 3 unspecified: Secondary | ICD-10-CM

## 2015-07-09 DIAGNOSIS — I340 Nonrheumatic mitral (valve) insufficiency: Secondary | ICD-10-CM | POA: Diagnosis not present

## 2015-07-09 DIAGNOSIS — I4891 Unspecified atrial fibrillation: Secondary | ICD-10-CM

## 2015-07-09 NOTE — Assessment & Plan Note (Signed)
Pressure controlled. Continue present medications.

## 2015-07-09 NOTE — Assessment & Plan Note (Signed)
Followed by nephrology. 

## 2015-07-09 NOTE — Assessment & Plan Note (Signed)
Continue statin. 

## 2015-07-09 NOTE — Patient Instructions (Signed)
Your physician wants you to follow-up in: ONE YEAR WITH DR CRENSHAW You will receive a reminder letter in the mail two months in advance. If you don't receive a letter, please call our office to schedule the follow-up appointment.  

## 2015-07-09 NOTE — Progress Notes (Signed)
HPI: FU hypertension and atrial fibrillation. Patient previously followed in [place]. She apparently had a monitor previously that showed short episodes of atrial fibrillation for up to 1 minute. Patient had a stress Myoview in July 2011 that was normal. Her ejection fraction was 67%. Cardiac monitor repeated in April of 2014 because of palpitations. This revealed sinus rhythm. Patient was admitted to [facility] in September 2015 with acute renal failure. She does have polycystic kidney disease. She is now followed by nephrology. Echocardiogram October 2015 showed normal LV function, grade 1 diastolic dysfunction, mild left atrial enlargement, moderate or 2+ mitral regurgitation. Since she was last seen, she denies dyspnea, chest pain, palpitations or syncope.  Current Outpatient Prescriptions  Medication Sig Dispense Refill  . acetaminophen (TYLENOL) 500 MG tablet Take 1,000 mg by mouth every 6 (six) hours as needed for fever (pain).    [name] Kitchen atorvastatin (LIPITOR) 80 MG tablet Take 1 tablet (80 mg total) by mouth at bedtime. 30 tablet 0  . colchicine (COLCRYS) 0.6 MG tablet Take 1 tablet (0.6 mg total) by mouth See admin instructions. Take 1 tablet (0.6 mg) by mouth every hour for 3 hours when needed for gout attacks, repeat 2nd day, if gout continues on 3rd day consult with doctor 30 tablet 5  . cyclobenzaprine (FLEXERIL) 10 MG tablet Take 10 mg by mouth 3 (three) times daily as needed for muscle spasms.    . famotidine (PEPCID) 20 MG tablet Take 20 mg by mouth daily with lunch.     . Febuxostat (ULORIC) 80 MG TABS Take 80 mg by mouth daily with lunch.    . furosemide (LASIX) 40 MG tablet Take 40 mg by mouth daily after lunch.    . furosemide (LASIX) 80 MG tablet Take 80 mg by mouth.    . loratadine (CLARITIN) 10 MG tablet Take 10 mg by mouth daily as needed (seasonal allergies). Daily with lunch    . magnesium oxide (MAG-OX) 400 MG tablet Take 400 mg by mouth at bedtime as  needed (cramping).    . metoprolol succinate (TOPROL-XL) 100 MG 24 hr tablet Take 100 mg by mouth at bedtime.  1  . Multiple Vitamin (MULTIVITAMIN WITH MINERALS) TABS tablet Take 1 tablet by mouth daily with lunch. Centrum Silver for Women    . Probiotic Product (PHILLIPS COLON HEALTH PO) Take 1 capsule by mouth daily.    . valsartan (DIOVAN) 160 MG tablet Take 160 mg by mouth every morning.    . venlafaxine (EFFEXOR) 37.5 MG tablet Take 37.5 mg by mouth daily.     No current facility-administered medications for this visit.     Past Medical History  Diagnosis Date  . Hypertension   . Arthritis   . Atrial fibrillation 2012  . Hyperlipidemia   . Polycystic kidney disease   . SDH (subdural hematoma)   . Gout     Past Surgical History  Procedure Laterality Date  . Radial head implant  09/24/2011    Procedure: RADIAL HEAD IMPLANT;  Surgeon: Schuyler Amor, MD;  Location: [place];  Service: Orthopedics;  Laterality: Left;  left radial head replacement  . Orif elbow fracture  10/22/2011    Procedure: OPEN REDUCTION INTERNAL FIXATION (ORIF) ELBOW/OLECRANON FRACTURE;  Surgeon: Schuyler Amor, MD;  Location: [place];  Service: Orthopedics;  Laterality: Left;  open reduction  possible lateral reconstruction with palmaris graft from left or right side.  . Evacuation of subdural hematoma  11/26/2000  . Tonsillectomy    . Colonoscopy  03/2004    repeat 10 years    Social History   Social History  . Marital Status: Married    Spouse Name: N/A  . Number of Children: 0  . Years of Education: N/A   Occupational History  . OWNER    Social History Main Topics  . Smoking status: Former Smoker -- 2.00 packs/day for 24 years    Types: Cigarettes    Quit date: 12/12/1996  . Smokeless tobacco: Never Used  . Alcohol Use: 1.2 oz/week    2 Glasses of wine per week     Comment: 2-3 glasses wine per day  . Drug Use: No  . Sexual Activity:     Partners: Female   Other Topics Concern  . Not on file   Social History Narrative    ROS: pain in knee from gout but no fevers or chills, productive cough, hemoptysis, dysphasia, odynophagia, melena, hematochezia, dysuria, hematuria, rash, seizure activity, orthopnea, PND, pedal edema, claudication. Remaining systems are negative.  Physical Exam: Well-developed well-nourished in no acute distress.  Skin is warm and dry.  HEENT is normal.  Neck is supple.  Chest is clear to auscultation with normal expansion.  Cardiovascular exam is regular rate and rhythm.  Abdominal exam nontender or distended. No masses palpated. Extremities show no edema. Left knee with warmth and effusion. neuro grossly intact  ECG sinus rhythm at a rate of 92. Low voltage. No ST changes.

## 2015-07-09 NOTE — Assessment & Plan Note (Signed)
Patient will need follow-up echoes in the future. 

## 2015-07-09 NOTE — Assessment & Plan Note (Signed)
Continue beta blocker. 

## 2015-07-09 NOTE — Assessment & Plan Note (Signed)
     Patient apparently had only one episode of atrial fibrillation previously for less than 1 minute on monitor. I do not think she requires long-term anticoagulation. If she has documented recurrence of atrial fibrillation then we would start anticoagulation at that point. Continue beta blocker.

## 2015-07-10 ENCOUNTER — Encounter: Payer: Self-pay | Admitting: Physician Assistant

## 2015-07-14 ENCOUNTER — Other Ambulatory Visit: Payer: Self-pay | Admitting: Physician Assistant

## 2015-09-09 ENCOUNTER — Encounter: Payer: Self-pay | Admitting: Nurse Practitioner

## 2015-09-09 ENCOUNTER — Ambulatory Visit (INDEPENDENT_AMBULATORY_CARE_PROVIDER_SITE_OTHER): Payer: BLUE CROSS/BLUE SHIELD | Admitting: Nurse Practitioner

## 2015-09-09 VITALS — BP 110/70 | HR 84 | Ht 62.25 in | Wt 182.0 lb

## 2015-09-09 DIAGNOSIS — Z01419 Encounter for gynecological examination (general) (routine) without abnormal findings: Secondary | ICD-10-CM

## 2015-09-09 DIAGNOSIS — N183 Chronic kidney disease, stage 3 (moderate): Secondary | ICD-10-CM

## 2015-09-09 DIAGNOSIS — Q613 Polycystic kidney, unspecified: Secondary | ICD-10-CM

## 2015-09-09 DIAGNOSIS — Z0000 Encounter for general adult medical examination without abnormal findings: Secondary | ICD-10-CM

## 2015-09-09 NOTE — Progress Notes (Signed)
59 y.o. G0P0 Married (partner Greer Ee) Caucasian Fe here for annual exam. She has had another hospital event 06/15/15 for chronic kidney disease stage III with polycystic kidney.  She had worsening of serum creatine from 2.9 -4.6. She has associated fever and abdominal pain.  Her lab is now stable with Creatine 2.7.  Sees Dr. Mercy Moore for nephrology.  Has labs done every 2-3 months.  She is also trying to taper off Effexor - now at 37.5 mg every other day.  With her gout flare and taking Colchicine she will get N/V and diarrhea.  Last time of taking med's was a week ago.  Gout is still flared in left great toe.  She has noted  weight loss.  Patient's last menstrual period was 07/11/2009 (approximate).          Sexually active: Yes.    The current method of family planning is none.    Exercising: No.  The patient does not participate in regular exercise at present. Smoker:  no  Health Maintenance: Pap: 07/31/12, WNL, neg HR HPV MMG: 09/11/14, Bi-Rads 1: Negative   Colonoscopy: 03/14/04, normal, 10 year recall BMD: 01/06/11, Normal TDaP: 07/27/11 Labs: Dr. Mercy Moore   reports that she quit smoking about 18 years ago. Her smoking use included Cigarettes. She has a 48 pack-year smoking history. She has never used smokeless tobacco. She reports that she drinks about 1.2 oz of alcohol per week. She reports that she does not use illicit drugs.  Past Medical History  Diagnosis Date  . Hypertension   . Arthritis   . Atrial fibrillation ([place]) 2012  . Hyperlipidemia   . Polycystic kidney disease   . SDH (subdural hematoma) ([place])   . Gout     Past Surgical History  Procedure Laterality Date  . Radial head implant  09/24/2011    Procedure: RADIAL HEAD IMPLANT;  Surgeon: Schuyler Amor, MD;  Location: [place];  Service: Orthopedics;  Laterality: Left;  left radial head replacement  . Orif elbow fracture  10/22/2011    Procedure: OPEN REDUCTION INTERNAL FIXATION (ORIF)  ELBOW/OLECRANON FRACTURE;  Surgeon: Schuyler Amor, MD;  Location: [place];  Service: Orthopedics;  Laterality: Left;  open reduction  possible lateral reconstruction with palmaris graft from left or right side.  . Evacuation of subdural hematoma  11/26/2000  . Tonsillectomy    . Colonoscopy  03/2004    repeat 10 years    Current Outpatient Prescriptions  Medication Sig Dispense Refill  . acetaminophen (TYLENOL) 500 MG tablet Take 1,000 mg by mouth every 6 (six) hours as needed for fever (pain).    [name] Kitchen amLODipine-atorvastatin ([name]) 10-80 MG tablet TAKE ONE TABLET BY MOUTH ONCE DAILY 30 tablet 2  . atorvastatin (LIPITOR) 80 MG tablet Take 1 tablet (80 mg total) by mouth at bedtime. 30 tablet 0  . colchicine (COLCRYS) 0.6 MG tablet Take 1 tablet (0.6 mg total) by mouth See admin instructions. Take 1 tablet (0.6 mg) by mouth every hour for 3 hours when needed for gout attacks, repeat 2nd day, if gout continues on 3rd day consult with doctor 30 tablet 5  . cyclobenzaprine (FLEXERIL) 10 MG tablet Take 10 mg by mouth 3 (three) times daily as needed for muscle spasms.    . Febuxostat (ULORIC) 80 MG TABS Take 80 mg by mouth daily with lunch.    . furosemide (LASIX) 40 MG tablet Take 40 mg by mouth daily after lunch.    [name] Kitchen  furosemide (LASIX) 80 MG tablet Take 80 mg by mouth.    . loratadine (CLARITIN) 10 MG tablet Take 10 mg by mouth daily as needed (seasonal allergies). Daily with lunch    . magnesium oxide (MAG-OX) 400 MG tablet Take 400 mg by mouth at bedtime as needed (cramping).    . metoprolol succinate (TOPROL-XL) 100 MG 24 hr tablet Take 100 mg by mouth at bedtime.  1  . Multiple Vitamin (MULTIVITAMIN WITH MINERALS) TABS tablet Take 1 tablet by mouth daily with lunch. Centrum Silver for Women    . Probiotic Product (PHILLIPS COLON HEALTH PO) Take 1 capsule by mouth daily.    . SODIUM BICARBONATE PO Take by mouth.    . valsartan (DIOVAN) 160 MG tablet Take 160 mg by mouth  every morning.    . venlafaxine (EFFEXOR) 37.5 MG tablet Take 37.5 mg by mouth every other day.      No current facility-administered medications for this visit.    Family History  Problem Relation Age of Onset  . Polycystic kidney disease Father   . Heart failure Father   . Heart disease Mother     Rheumatic fever    ROS:  Pertinent items are noted in HPI.  Otherwise, a comprehensive ROS was negative.  Exam:   BP 110/70 mmHg  Pulse 84  Ht 5' 2.25" (1.581 m)  Wt 182 lb (82.555 kg)  BMI 33.03 kg/m2  LMP 07/11/2009 (Approximate) Height: 5' 2.25" (158.1 cm) Ht Readings from Last 3 Encounters:  09/09/15 5' 2.25" (1.581 m)  07/09/15 5\' 2"  (1.575 m)  06/16/15 5\' 2"  (1.575 m)    General appearance: alert, cooperative and appears stated age Head: Normocephalic, without obvious abnormality, atraumatic Neck: no adenopathy, supple, symmetrical, trachea midline and thyroid normal to inspection and palpation Lungs: clear to auscultation bilaterally Breasts: normal appearance, no masses or tenderness Heart: regular rate and rhythm Abdomen: soft, non-tender; no masses,  no organomegaly Extremities: extremities normal, atraumatic, no cyanosis or edema Skin: Skin color, texture, turgor normal. No rashes or lesions Lymph nodes: Cervical, supraclavicular, and axillary nodes normal. No abnormal inguinal nodes palpated Neurologic: Grossly normal   Pelvic: External genitalia:  no lesions              Urethra:  normal appearing urethra with no masses, tenderness or lesions              Bartholin's and Skene's: normal                 Vagina: normal appearing vagina with normal color and discharge, no lesions              Cervix: anteverted              Pap taken: Yes.   Bimanual Exam:  Uterus:  normal size, contour, position, consistency, mobility, non-tender              Adnexa: no mass, fullness, tenderness               Rectovaginal: Confirms               Anus:  normal sphincter tone,  no lesions  Chaperone present: yes  A:  Well Woman with normal exam  Postmenopausal no HRT Fibromyalgia Situational depression and anxiety History of SVT, gout History of polycystic kidney and chronic kidney disease stage IIIsince 07/10/14 - 2 hospitalizations this past year.  Weight loss of 24 pounds since last year  - with illness  not intentional  P:   Reviewed health and wellness pertinent to exam  Pap smear as above  Mammogram is due 12/16 and will schedule  She will reduce Effexor to every 3 days, then to 4 days, 5 days then off.  Counseled on breast self exam, mammography screening, adequate intake of calcium and vitamin D, diet and exercise return annually or prn  An After Visit Summary was printed and given to the patient.

## 2015-09-09 NOTE — Patient Instructions (Addendum)

## 2015-09-09 NOTE — Progress Notes (Signed)
Encounter reviewed by Dr. Evva Din Amundson C. Silva.  

## 2015-09-11 LAB — IPS PAP TEST WITH HPV

## 2015-09-15 ENCOUNTER — Telehealth: Payer: Self-pay | Admitting: Nurse Practitioner

## 2015-09-15 NOTE — Telephone Encounter (Signed)
Patient is calling to have HIV, Hep C added to her order for blood work at [facility] in [place]. She states [facility] told her that PG has to call to have this added.

## 2015-09-15 NOTE — Telephone Encounter (Signed)
Can we get these added?  Not sure of dates of service.

## 2015-09-15 NOTE — Telephone Encounter (Signed)
Kem Boroughs, FNP, okay to call and have these labs added to the patients lab work?

## 2015-09-16 ENCOUNTER — Other Ambulatory Visit: Payer: Self-pay | Admitting: Obstetrics & Gynecology

## 2015-09-16 ENCOUNTER — Other Ambulatory Visit: Payer: Self-pay | Admitting: Nurse Practitioner

## 2015-09-16 DIAGNOSIS — Z1231 Encounter for screening mammogram for malignant neoplasm of breast: Secondary | ICD-10-CM

## 2015-09-16 NOTE — Telephone Encounter (Signed)
Spoke with [facility] in [place]. Lab order will need to be faxed to [phone] so that all the labs can be drawn together. Order for HIV and Hep C written and to Kem Boroughs, FNP for review and signature before fax.

## 2015-09-16 NOTE — Telephone Encounter (Addendum)
Spoke with patient's wife Pam, okay per ROI. Wife states that the patient has not had her lab work performed yet as she was waiting to see if these labs could be added. Will be going to [facility] in [place] at [address] phone number [phone] fax number [phone]. Advised I will contact the [facility] to see how they prefer orders be added and return call once this has been completed.

## 2015-09-16 NOTE — Telephone Encounter (Signed)
This is done.

## 2015-09-16 NOTE — Telephone Encounter (Signed)
Spoke with patient. Advised signed lab orders have been faxed to [facility] in [place] at [phone] with cover sheet and confirmation. Patient is agreeable and will call to schedule lab appointment.  Routing to provider for final review. Patient agreeable to disposition. Will close encounter.

## 2015-09-17 LAB — HIV ANTIBODY (ROUTINE TESTING W REFLEX): HIV SCREEN 4TH GENERATION: NONREACTIVE

## 2015-09-17 LAB — HEPATITIS C ANTIBODY

## 2015-09-17 LAB — HEPATITIS B SURFACE ANTIBODY,QUALITATIVE: Hep B Surface Ab, Qual: NONREACTIVE

## 2015-09-17 LAB — RPR QUALITATIVE: RPR: NONREACTIVE

## 2015-10-03 ENCOUNTER — Ambulatory Visit (INDEPENDENT_AMBULATORY_CARE_PROVIDER_SITE_OTHER): Payer: BLUE CROSS/BLUE SHIELD

## 2015-10-03 DIAGNOSIS — Z1231 Encounter for screening mammogram for malignant neoplasm of breast: Secondary | ICD-10-CM

## 2015-10-08 ENCOUNTER — Other Ambulatory Visit: Payer: Self-pay | Admitting: Physician Assistant

## 2015-10-28 ENCOUNTER — Ambulatory Visit (INDEPENDENT_AMBULATORY_CARE_PROVIDER_SITE_OTHER): Payer: BLUE CROSS/BLUE SHIELD | Admitting: Sports Medicine

## 2015-10-28 ENCOUNTER — Encounter: Payer: Self-pay | Admitting: Physician Assistant

## 2015-10-28 ENCOUNTER — Ambulatory Visit (INDEPENDENT_AMBULATORY_CARE_PROVIDER_SITE_OTHER): Payer: BLUE CROSS/BLUE SHIELD | Admitting: Physician Assistant

## 2015-10-28 VITALS — BP 123/67 | HR 105 | Ht 62.25 in | Wt 179.0 lb

## 2015-10-28 DIAGNOSIS — Q613 Polycystic kidney, unspecified: Secondary | ICD-10-CM

## 2015-10-28 DIAGNOSIS — A084 Viral intestinal infection, unspecified: Secondary | ICD-10-CM

## 2015-10-28 DIAGNOSIS — N184 Chronic kidney disease, stage 4 (severe): Secondary | ICD-10-CM | POA: Diagnosis not present

## 2015-10-28 DIAGNOSIS — E860 Dehydration: Secondary | ICD-10-CM | POA: Diagnosis not present

## 2015-10-28 DIAGNOSIS — I878 Other specified disorders of veins: Secondary | ICD-10-CM | POA: Diagnosis not present

## 2015-10-28 LAB — COMPLETE METABOLIC PANEL WITH GFR
ALT: 21 U/L (ref 6–29)
AST: 18 U/L (ref 10–35)
Albumin: 4.4 g/dL (ref 3.6–5.1)
Alkaline Phosphatase: 112 U/L (ref 33–130)
BILIRUBIN TOTAL: 0.4 mg/dL (ref 0.2–1.2)
BUN: 95 mg/dL — ABNORMAL HIGH (ref 7–25)
CHLORIDE: 107 mmol/L (ref 98–110)
CO2: 17 mmol/L — AB (ref 20–31)
Calcium: 8.9 mg/dL (ref 8.6–10.4)
Creat: 3.44 mg/dL — ABNORMAL HIGH (ref 0.50–0.99)
GFR, EST NON AFRICAN AMERICAN: 14 mL/min — AB (ref >=60)
GFR, Est African American: 16 mL/min — ABNORMAL LOW (ref >=60)
GLUCOSE: 89 mg/dL (ref 65–99)
POTASSIUM: 5.2 mmol/L (ref 3.5–5.3)
SODIUM: 140 mmol/L (ref 135–146)
TOTAL PROTEIN: 6.7 g/dL (ref 6.1–8.1)

## 2015-10-28 LAB — CBC WITH DIFFERENTIAL/PLATELET
BASOS PCT: 0 % (ref 0–1)
Basophils Absolute: 0 10*3/uL (ref 0.0–0.1)
EOS ABS: 0 10*3/uL (ref 0.0–0.7)
EOS PCT: 0 % (ref 0–5)
HCT: 30.6 % — ABNORMAL LOW (ref 36.0–46.0)
Hemoglobin: 10.4 g/dL — ABNORMAL LOW (ref 12.0–15.0)
LYMPHS ABS: 0.3 10*3/uL — AB (ref 0.7–4.0)
LYMPHS PCT: 3 % — AB (ref 12–46)
MCH: 28.6 pg (ref 26.0–34.0)
MCHC: 34 g/dL (ref 30.0–36.0)
MCV: 84.1 fL (ref 78.0–100.0)
MONOS PCT: 3 % (ref 3–12)
MPV: 9.1 fL (ref 8.6–12.4)
Monocytes Absolute: 0.3 10*3/uL (ref 0.1–1.0)
NEUTROS ABS: 8.9 10*3/uL — AB (ref 1.7–7.7)
NEUTROS PCT: 94 % — AB (ref 43–77)
PLATELETS: 287 10*3/uL (ref 150–400)
RBC: 3.64 MIL/uL — ABNORMAL LOW (ref 3.87–5.11)
RDW: 13.4 % (ref 11.5–15.5)
WBC: 9.5 10*3/uL (ref 4.0–10.5)

## 2015-10-28 MED ORDER — PROMETHAZINE HCL 25 MG/ML IJ SOLN
25.0000 mg | Freq: Once | INTRAMUSCULAR | Status: AC
  Administered 2015-10-28: 25 mg via INTRAMUSCULAR

## 2015-10-28 MED ORDER — PROMETHAZINE HCL 25 MG RE SUPP: 25.0000 mg | Freq: Four times a day (QID) | RECTAL | Status: DC | PRN

## 2015-10-28 NOTE — Progress Notes (Signed)
   Subjective:    I'm seeing this patient as a consultation for:  Iran Planas, PA-C  CC: Needs venous access  HPI: This is a pleasant 60 year old female with known stage IV chronic kidney disease with creatinine in the upper threes to low fours, currently looking at dialysis, as well as renal transplantation. She has had nausea and vomiting for several days now is unable to keep any oral fluids and, and is dehydrated. I am consulted for further evaluation and definitive management with venous access.  Past medical history, Surgical history, Family history not pertinant except as noted below, Social history, Allergies, and medications have been entered into the medical record, reviewed, and no changes needed.   Review of Systems: No headache, visual changes, nausea, vomiting, diarrhea, constipation, dizziness, abdominal pain, skin rash, fevers, chills, night sweats, weight loss, swollen lymph nodes, body aches, joint swelling, muscle aches, chest pain, shortness of breath, mood changes, visual or auditory hallucinations.   Objective:   General: Well Developed, well nourished, and in no acute distress.  Neuro/Psych: Alert and oriented x3, extra-ocular muscles intact, able to move all 4 extremities, sensation grossly intact. Skin: Warm and dry, no rashes noted.  Respiratory: Not using accessory muscles, speaking in full sentences, trachea midline.  Cardiovascular: Pulses palpable, no extremity edema. Abdomen: Does not appear distended.  Single attempt in the left great saphenous vein was unsuccessful with a 20-gauge angiocatheter, repeat attempt on the volar wrist on the right side was successful with a 22-gauge angiocatheter. Patient does have relatively poor venous access.  Impression and Recommendations:   This case required medical decision making of moderate complexity.

## 2015-10-28 NOTE — Assessment & Plan Note (Signed)
After initial unsuccessful attempt in the left great saphenous vein I was able to place a 22-gauge angiocatheter into the right volar wrist. Further fluid management per primary.

## 2015-10-28 NOTE — Progress Notes (Signed)
   Subjective:    Patient ID: Mariah [name], female    DOB: 08/12/1956, 60 y.o.   MRN: [id number]  HPI  Pt is a 60 yo female who presents to the clinic with nausea, vomiting, diarrhea since 12:50 last night. Her symptoms presented all of a sudden. No sick contacts. Partner is not sick. She reports 12+ vomiting and separate 12+ diarrhea episodes. Tried 2 doses of phenergan but she threw it up. No fever. She does have CKD 4 due to polycystic kidney disease.    Review of Systems    see HPI.  Objective:   Physical Exam  Constitutional:  Pale and weak appearing.   HENT:  Head: Normocephalic and atraumatic.  Cardiovascular: Regular rhythm and normal heart sounds.   Tachycardia at 105.   Pulmonary/Chest: Effort normal and breath sounds normal. She has no wheezes.  No CVA tenderness.   Abdominal: Soft. Bowel sounds are normal. She exhibits no distension and no mass. There is no tenderness. There is no rebound and no guarding.  No abdominal tenderness, guarding or rebound.   Skin:  Pale.   Psychiatric: She has a normal mood and affect. Her behavior is normal.          Assessment & Plan:  Viral gastroenteritis- for nausea Phenergan IM 81m. Phenergan suppositories sent to pharmacy. Encouraged hydration. Will check cmp and cbc. BRAT diet. Rest. HO given. Call if not improving or worsening in 3 days.    Dehydration-consulted Dr. Dianah Field for IV access. 2 L of fluid given in office today. Discussed continue to hydrate orally at home. cmp ordered to check kidney function. Last serum creatine was 3.2.

## 2015-10-28 NOTE — Patient Instructions (Signed)

## 2015-10-29 ENCOUNTER — Telehealth: Payer: Self-pay | Admitting: *Deleted

## 2015-10-29 DIAGNOSIS — N183 Chronic kidney disease, stage 3 unspecified: Secondary | ICD-10-CM

## 2015-10-29 DIAGNOSIS — Q612 Polycystic kidney, adult type: Secondary | ICD-10-CM

## 2015-10-29 NOTE — Telephone Encounter (Signed)
BMP ordered to recheck kidney function.

## 2015-10-30 LAB — BASIC METABOLIC PANEL WITH GFR
BUN: 66 mg/dL — AB (ref 7–25)
CO2: 17 mmol/L — AB (ref 20–31)
Calcium: 9.5 mg/dL (ref 8.6–10.4)
Chloride: 105 mmol/L (ref 98–110)
Creat: 2.84 mg/dL — ABNORMAL HIGH (ref 0.50–0.99)
GFR, EST AFRICAN AMERICAN: 20 mL/min — AB (ref >=60)
GFR, Est Non African American: 17 mL/min — ABNORMAL LOW (ref >=60)
GLUCOSE: 95 mg/dL (ref 65–99)
POTASSIUM: 4.2 mmol/L (ref 3.5–5.3)
Sodium: 136 mmol/L (ref 135–146)

## 2015-11-21 ENCOUNTER — Encounter: Payer: Self-pay | Admitting: Physician Assistant

## 2015-11-21 ENCOUNTER — Ambulatory Visit (INDEPENDENT_AMBULATORY_CARE_PROVIDER_SITE_OTHER): Payer: BLUE CROSS/BLUE SHIELD | Admitting: Physician Assistant

## 2015-11-21 VITALS — BP 133/41 | HR 73 | Ht 62.25 in | Wt 184.0 lb

## 2015-11-21 DIAGNOSIS — N184 Chronic kidney disease, stage 4 (severe): Secondary | ICD-10-CM | POA: Diagnosis not present

## 2015-11-21 DIAGNOSIS — Q612 Polycystic kidney, adult type: Secondary | ICD-10-CM

## 2015-11-21 NOTE — Progress Notes (Addendum)
   Subjective:    Patient ID: Mariah [name], female    DOB: 09-27-1956, 60 y.o.   MRN: [id number]  HPI  Patient is a 60 year old female presenting to establish care. Patient has a past medical history relevent for chronic kidney disease (Stage 4) and polycystic kidney disease. Patient has concerns for having a plan of action in place for when she has ruptured cysts. In particular, patient wants to have access to an antibiotic regimen, oxygen supplementation, and pain management to prevent recurrent visits to the emergency room. Patient's baseline creatinine is 2.84 and GFR is 17. Patient will resume management with her nephrologist, whom she sees every three months.   Review of Systems Please see HPI   Objective:   Physical Exam  Constitutional: She is oriented to person, place, and time. She appears well-developed and well-nourished.  HENT:  Head: Normocephalic and atraumatic.  Eyes: Conjunctivae and EOM are normal. Pupils are equal, round, and reactive to light.  Neck: Normal range of motion. No thyromegaly present.  Cardiovascular: Normal rate, normal heart sounds and intact distal pulses.   No murmur heard. Pulmonary/Chest: Effort normal and breath sounds normal. No respiratory distress. She has no wheezes.  Abdominal: Soft. Bowel sounds are normal. She exhibits no distension. There is no tenderness. There is no rebound and no guarding.  Musculoskeletal: Normal range of motion.  Neurological: She is alert and oriented to person, place, and time.  Skin: Skin is warm and dry.  Psychiatric: She has a normal mood and affect. Her behavior is normal. Judgment and thought content normal.     Assessment & Plan:  1. Polycystic Kidney Disease/CKD stage 4.   Patient was advised to seek care early when symptoms of ruptured cysts arise to facilitate appropriate pain management, oxygen supplementation, and antibiotic therapy.

## 2015-12-09 ENCOUNTER — Other Ambulatory Visit: Payer: Self-pay | Admitting: Physician Assistant

## 2016-01-07 ENCOUNTER — Other Ambulatory Visit: Payer: Self-pay | Admitting: Family Medicine

## 2016-01-24 ENCOUNTER — Other Ambulatory Visit: Payer: Self-pay | Admitting: Nurse Practitioner

## 2016-01-26 NOTE — Telephone Encounter (Signed)
Medication refill request: Effexor Last AEX:  09-09-15 Next AEX: 09-14-16  Last MMG (if hormonal medication request): 10-07-15 WNl Refill authorized: please advise

## 2016-02-06 ENCOUNTER — Other Ambulatory Visit: Payer: Self-pay | Admitting: Physician Assistant

## 2016-02-10 ENCOUNTER — Other Ambulatory Visit: Payer: Self-pay | Admitting: *Deleted

## 2016-02-10 MED ORDER — AMLODIPINE-ATORVASTATIN 10-80 MG PO TABS: 1.0000 | ORAL_TABLET | Freq: Every day | ORAL | Status: DC

## 2016-02-13 ENCOUNTER — Telehealth: Payer: Self-pay | Admitting: *Deleted

## 2016-02-13 ENCOUNTER — Other Ambulatory Visit: Payer: Self-pay | Admitting: *Deleted

## 2016-02-13 MED ORDER — HYDROCODONE-HOMATROPINE 5-1.5 MG/5ML PO SYRP
5.0000 mL | ORAL_SOLUTION | Freq: Every evening | ORAL | Status: AC | PRN
Start: 2016-02-13 — End: 2016-02-23

## 2016-02-13 MED ORDER — MAGNESIUM OXIDE 400 MG PO TABS: 400.0000 mg | ORAL_TABLET | Freq: Every evening | ORAL | Status: DC | PRN

## 2016-02-13 NOTE — Telephone Encounter (Signed)
[place] for hycodan 56mL at bedtime #184mL NRF

## 2016-02-13 NOTE — Telephone Encounter (Signed)
Hycodan placed up front for pt to pick up.  Pt aware.

## 2016-02-13 NOTE — Telephone Encounter (Signed)
Pt left vm wanting to know if you could give her something to take at night for her cough or do you need her to come in? Please advise.

## 2016-02-17 ENCOUNTER — Encounter: Payer: Self-pay | Admitting: Physician Assistant

## 2016-03-09 ENCOUNTER — Ambulatory Visit (INDEPENDENT_AMBULATORY_CARE_PROVIDER_SITE_OTHER): Payer: BLUE CROSS/BLUE SHIELD | Admitting: Physician Assistant

## 2016-03-09 ENCOUNTER — Encounter: Payer: Self-pay | Admitting: Physician Assistant

## 2016-03-09 VITALS — BP 119/61 | HR 75 | Temp 98.6°F | Ht 62.25 in | Wt 184.0 lb

## 2016-03-09 DIAGNOSIS — Q613 Polycystic kidney, unspecified: Secondary | ICD-10-CM

## 2016-03-09 DIAGNOSIS — R319 Hematuria, unspecified: Secondary | ICD-10-CM

## 2016-03-09 DIAGNOSIS — N184 Chronic kidney disease, stage 4 (severe): Secondary | ICD-10-CM

## 2016-03-09 DIAGNOSIS — R109 Unspecified abdominal pain: Secondary | ICD-10-CM | POA: Insufficient documentation

## 2016-03-09 LAB — POCT URINALYSIS DIPSTICK
Bilirubin, UA: NEGATIVE
Glucose, UA: NEGATIVE
Ketones, UA: NEGATIVE
Nitrite, UA: NEGATIVE
PH UA: 6
PROTEIN UA: 100
SPEC GRAV UA: 1.01
UROBILINOGEN UA: 0.2

## 2016-03-09 MED ORDER — HYDROMORPHONE HCL 2 MG PO TABS: 2.0000 mg | ORAL_TABLET | ORAL | Status: DC | PRN

## 2016-03-09 MED ORDER — CEFTRIAXONE SODIUM 1 G IJ SOLR
1.0000 g | Freq: Once | INTRAMUSCULAR | Status: AC
  Administered 2016-03-09: 1 g via INTRAMUSCULAR

## 2016-03-09 MED ORDER — SULFAMETHOXAZOLE-TRIMETHOPRIM 800-160 MG PO TABS: 1.0000 | ORAL_TABLET | Freq: Two times a day (BID) | ORAL | Status: DC

## 2016-03-09 MED ORDER — HYDROCODONE-ACETAMINOPHEN 5-325 MG PO TABS: 1.0000 | ORAL_TABLET | Freq: Three times a day (TID) | ORAL | Status: DC | PRN

## 2016-03-09 NOTE — Progress Notes (Signed)
   Subjective:    Patient ID: Mariah [name], female    DOB: Apr 07, 1956, 60 y.o.   MRN: [id number]  HPI Patient is a 60 year old female with a past medical history of polycystic kidney disease and chronic kidney disease stage IV who presents to the clinic with left sided kidney pain. In the process of getting on transplant list. She has a presentation that has repeated itself multiple times. She it starts with pain until a cyst ruptures. She didn't have to have IV antibiotics, fluid and very close monitoring of her kidney function. She would like to be managed outpatient due to inpatient cost. She has had symptoms for the last 3 days. She denies any fever, chills, nausea or vomiting. No dysuria. She does admit breathing is painful. Hx of O2 stats dropping as symptoms worsen. Pulse ox at home is 94 percent.    Review of Systems See HPI.     Objective:   Physical Exam  Constitutional: She is oriented to person, place, and time. She appears well-developed and well-nourished.  HENT:  Head: Normocephalic and atraumatic.  Cardiovascular: Normal rate, regular rhythm and normal heart sounds.   Pulmonary/Chest: Effort normal and breath sounds normal.  Left CVA tenderness.   Abdominal:  Left upper quadrant tenderness. No guarding or rebound.   Neurological: She is alert and oriented to person, place, and time.  Skin: Skin is dry.  Psychiatric: She has a normal mood and affect. Her behavior is normal.          Assessment & Plan:  PCKD/left flank pain/CKD stage 4- .[name] Kitchen Results for orders placed or performed in visit on 03/09/16  POCT urinalysis dipstick  Result Value Ref Range   Color, UA yellow    Clarity, UA clear    Glucose, UA neg    Bilirubin, UA neg    Ketones, UA neg    Spec Grav, UA 1.010    Blood, UA trace-lysed    pH, UA 6.0    Protein, UA 100    Urobilinogen, UA 0.2    Nitrite, UA neg    Leukocytes, UA Trace (A) Negative   Will culture.  Will hold off on imaging.  Will  treat for cyst rupture.  Pulse ox with ambulation was 93 percent not a candidate for O2 supplementation.  Rocephin given today 1g.  Start bactrim tomorrow for 7 days.  Dilaudid for pain control.  Rest and stay hydrated.  Recheck BNP on Thursday.

## 2016-03-11 LAB — URINE CULTURE: Colony Count: 30000

## 2016-03-12 LAB — BASIC METABOLIC PANEL
BUN: 85 mg/dL — AB (ref 7–25)
CO2: 19 mmol/L — AB (ref 20–31)
Calcium: 9.2 mg/dL (ref 8.6–10.4)
Chloride: 98 mmol/L (ref 98–110)
Creat: 4.92 mg/dL — ABNORMAL HIGH (ref 0.50–0.99)
GLUCOSE: 110 mg/dL — AB (ref 65–99)
POTASSIUM: 5 mmol/L (ref 3.5–5.3)
Sodium: 135 mmol/L (ref 135–146)

## 2016-03-26 ENCOUNTER — Other Ambulatory Visit: Payer: Self-pay | Admitting: Nurse Practitioner

## 2016-03-26 NOTE — Telephone Encounter (Signed)
Medication refill request: flexeril  Last AEX:  09/09/15 PG Next AEX: 09/14/16 PG Last MMG (if hormonal medication request): 10/07/15 [id number]:neg Refill authorized: please advise

## 2016-03-29 ENCOUNTER — Telehealth: Payer: Self-pay | Admitting: Nurse Practitioner

## 2016-03-29 NOTE — Telephone Encounter (Signed)
Patient says she is going to stay on the effexor because she needs a kidney transplant. Wanted to let Patty know what's going on.

## 2016-03-29 NOTE — Telephone Encounter (Signed)
Spoke with patient. Patient states that she is in "rapid kidney failure" and is being referred to [facility] for a kidney transplant. States she has previously discussed with Kem Boroughs, FNP coming off of her Effexor, but does not feel this is a good idea at this time. Would like to continue taking Effexor 37.5 mg daily. "I just wanted her to know for when I need a refill." Advised I will provide Kem Boroughs, FNP with an update. She is agreeable.  Routing to provider for final review. Patient agreeable to disposition. Will close encounter.

## 2016-04-12 ENCOUNTER — Ambulatory Visit (INDEPENDENT_AMBULATORY_CARE_PROVIDER_SITE_OTHER): Payer: BLUE CROSS/BLUE SHIELD | Admitting: Physician Assistant

## 2016-04-12 VITALS — BP 134/67 | HR 88 | Wt 188.0 lb

## 2016-04-12 DIAGNOSIS — Z111 Encounter for screening for respiratory tuberculosis: Secondary | ICD-10-CM | POA: Diagnosis not present

## 2016-04-12 NOTE — Progress Notes (Signed)
   Subjective:    Patient ID: Mariah [name], female    DOB: [birth year], 60 y.o.   MRN: [id number]  HPI   Patient is here for PPD placement. She is going to have to have a kidney transplant and PPD placement is part of the process.  Review of Systems     Objective:   Physical Exam        Assessment & Plan:  Patient will return in 48-72 hours. We will print off results for her to take with her.

## 2016-04-14 ENCOUNTER — Ambulatory Visit (INDEPENDENT_AMBULATORY_CARE_PROVIDER_SITE_OTHER): Payer: BLUE CROSS/BLUE SHIELD | Admitting: Physician Assistant

## 2016-04-14 VITALS — BP 118/71 | HR 82

## 2016-04-14 DIAGNOSIS — Z111 Encounter for screening for respiratory tuberculosis: Secondary | ICD-10-CM

## 2016-04-14 LAB — TB SKIN TEST
Induration: 0 mm
TB Skin Test: NEGATIVE

## 2016-04-14 NOTE — Progress Notes (Signed)
   Subjective:    Patient ID: Mariah [name], female    DOB: 26-Oct-1955, 60 y.o.   MRN: [id number]  HPI  Pt here to have PPD read.      Review of Systems     Objective:   Physical Exam        Assessment & Plan:  Placement was in right forearm, no reaction noted 0 mm.

## 2016-04-29 ENCOUNTER — Ambulatory Visit (INDEPENDENT_AMBULATORY_CARE_PROVIDER_SITE_OTHER): Payer: BLUE CROSS/BLUE SHIELD | Admitting: Physician Assistant

## 2016-04-29 VITALS — BP 111/71 | HR 80 | Temp 98.1°F | Wt 189.0 lb

## 2016-04-29 DIAGNOSIS — Z23 Encounter for immunization: Secondary | ICD-10-CM | POA: Diagnosis not present

## 2016-04-29 NOTE — Progress Notes (Signed)
Patient came into clinic today to begin her Hep B immunization series and get her Prevnar 80. Pt tolerated Hep B injection in right deltoid and Prevnar 23 in left deltoid well, no immediate complications. Pt advised to schedule nurse visits for 1 month and 6 months to complete Hep B series. Verbalized understanding, no further questions at this time.

## 2016-05-06 ENCOUNTER — Ambulatory Visit (HOSPITAL_COMMUNITY)
Admission: RE | Admit: 2016-05-06 | Discharge: 2016-05-06 | Disposition: A | Discharge status: Home / Self Care | Payer: BLUE CROSS/BLUE SHIELD | Source: Ambulatory Visit | Attending: Nephrology | Admitting: Nephrology

## 2016-05-06 DIAGNOSIS — N184 Chronic kidney disease, stage 4 (severe): Secondary | ICD-10-CM | POA: Diagnosis not present

## 2016-05-06 DIAGNOSIS — Z79899 Other long term (current) drug therapy: Secondary | ICD-10-CM | POA: Diagnosis not present

## 2016-05-06 DIAGNOSIS — D631 Anemia in chronic kidney disease: Secondary | ICD-10-CM | POA: Diagnosis not present

## 2016-05-06 DIAGNOSIS — Z5181 Encounter for therapeutic drug level monitoring: Secondary | ICD-10-CM | POA: Diagnosis not present

## 2016-05-06 LAB — POCT HEMOGLOBIN-HEMACUE: HEMOGLOBIN: 10.4 g/dL — AB (ref 12.0–15.0)

## 2016-05-06 MED ORDER — DARBEPOETIN ALFA 100 MCG/0.5ML IJ SOSY
PREFILLED_SYRINGE | INTRAMUSCULAR | Status: AC
  Filled 2016-05-06: qty 0.5

## 2016-05-06 MED ORDER — DARBEPOETIN ALFA 100 MCG/0.5ML IJ SOSY
100.0000 ug | PREFILLED_SYRINGE | INTRAMUSCULAR | Status: DC
  Administered 2016-05-06: 100 ug via SUBCUTANEOUS

## 2016-05-06 NOTE — Discharge Instructions (Signed)
Darbepoetin Alfa injection What is this medicine? DARBEPOETIN ALFA (dar be POE e tin AL fa) helps your body make more red blood cells. It is used to treat anemia caused by chronic kidney failure and chemotherapy. This medicine may be used for other purposes; ask your health care provider or pharmacist if you have questions. What should I tell my health care provider before I take this medicine? They need to know if you have any of these conditions: -blood clotting disorders or history of blood clots -cancer patient not on chemotherapy -cystic fibrosis -heart disease, such as angina, heart failure, or a history of a heart attack -hemoglobin level of 12 g/dL or greater -high blood pressure -low levels of folate, iron, or vitamin B12 -seizures -an unusual or allergic reaction to darbepoetin, erythropoietin, albumin, hamster proteins, latex, other medicines, foods, dyes, or preservatives -pregnant or trying to get pregnant -breast-feeding How should I use this medicine? This medicine is for injection into a vein or under the skin. It is usually given by a health care professional in a hospital or clinic setting. If you get this medicine at home, you will be taught how to prepare and give this medicine. Do not shake the solution before you withdraw a dose. Use exactly as directed. Take your medicine at regular intervals. Do not take your medicine more often than directed. It is important that you put your used needles and syringes in a special sharps container. Do not put them in a trash can. If you do not have a sharps container, call your pharmacist or healthcare provider to get one. Talk to your pediatrician regarding the use of this medicine in children. While this medicine may be used in children as young as 1 year for selected conditions, precautions do apply. Overdosage: If you think you have taken too much of this medicine contact a poison control center or emergency room at once. NOTE:  This medicine is only for you. Do not share this medicine with others. What if I miss a dose? If you miss a dose, take it as soon as you can. If it is almost time for your next dose, take only that dose. Do not take double or extra doses. What may interact with this medicine? Do not take this medicine with any of the following medications: -epoetin alfa This list may not describe all possible interactions. Give your health care provider a list of all the medicines, herbs, non-prescription drugs, or dietary supplements you use. Also tell them if you smoke, drink alcohol, or use illegal drugs. Some items may interact with your medicine. What should I watch for while using this medicine? Visit your prescriber or health care professional for regular checks on your progress and for the needed blood tests and blood pressure measurements. It is especially important for the doctor to make sure your hemoglobin level is in the desired range, to limit the risk of potential side effects and to give you the best benefit. Keep all appointments for any recommended tests. Check your blood pressure as directed. Ask your doctor what your blood pressure should be and when you should contact him or her. As your body makes more red blood cells, you may need to take iron, folic acid, or vitamin B supplements. Ask your doctor or health care provider which products are right for you. If you have kidney disease continue dietary restrictions, even though this medication can make you feel better. Talk with your doctor or health care professional about the   foods you eat and the vitamins that you take. What side effects may I notice from receiving this medicine? Side effects that you should report to your doctor or health care professional as soon as possible: -allergic reactions like skin rash, itching or hives, swelling of the face, lips, or tongue -breathing problems -changes in vision -chest pain -confusion, trouble speaking  or understanding -feeling faint or lightheaded, falls -high blood pressure -muscle aches or pains -pain, swelling, warmth in the leg -rapid weight gain -severe headaches -sudden numbness or weakness of the face, arm or leg -trouble walking, dizziness, loss of balance or coordination -seizures (convulsions) -swelling of the ankles, feet, hands -unusually weak or tired Side effects that usually do not require medical attention (report to your doctor or health care professional if they continue or are bothersome): -diarrhea -fever, chills (flu-like symptoms) -headaches -nausea, vomiting -redness, stinging, or swelling at site where injected This list may not describe all possible side effects. Call your doctor for medical advice about side effects. You may report side effects to FDA at 1-800-FDA-1088. Where should I keep my medicine? Keep out of the reach of children. Store in a refrigerator between 2 and 8 degrees C (36 and 46 degrees F). Do not freeze. Do not shake. Throw away any unused portion if using a single-dose vial. Throw away any unused medicine after the expiration date. NOTE: This sheet is a summary. It may not cover all possible information. If you have questions about this medicine, talk to your doctor, pharmacist, or health care provider.    2016, Elsevier/Gold Standard. (2008-09-10 10:23:57)  

## 2016-05-10 ENCOUNTER — Encounter: Payer: Self-pay | Admitting: Physician Assistant

## 2016-05-12 ENCOUNTER — Encounter: Payer: Self-pay | Admitting: Physician Assistant

## 2016-05-12 DIAGNOSIS — D126 Benign neoplasm of colon, unspecified: Secondary | ICD-10-CM | POA: Insufficient documentation

## 2016-05-17 ENCOUNTER — Encounter (HOSPITAL_COMMUNITY)
Admission: RE | Admit: 2016-05-17 | Discharge: 2016-05-17 | Disposition: A | Discharge status: Home / Self Care | Payer: BLUE CROSS/BLUE SHIELD | Source: Ambulatory Visit | Attending: Nephrology | Admitting: Nephrology

## 2016-05-17 DIAGNOSIS — Z79899 Other long term (current) drug therapy: Secondary | ICD-10-CM | POA: Diagnosis not present

## 2016-05-17 DIAGNOSIS — Z5181 Encounter for therapeutic drug level monitoring: Secondary | ICD-10-CM | POA: Insufficient documentation

## 2016-05-17 DIAGNOSIS — D631 Anemia in chronic kidney disease: Secondary | ICD-10-CM | POA: Diagnosis not present

## 2016-05-17 DIAGNOSIS — N184 Chronic kidney disease, stage 4 (severe): Secondary | ICD-10-CM | POA: Insufficient documentation

## 2016-05-17 LAB — POCT HEMOGLOBIN-HEMACUE: HEMOGLOBIN: 10.9 g/dL — AB (ref 12.0–15.0)

## 2016-05-17 MED ORDER — DARBEPOETIN ALFA 100 MCG/0.5ML IJ SOSY
PREFILLED_SYRINGE | INTRAMUSCULAR | Status: AC
  Filled 2016-05-17: qty 0.5

## 2016-05-17 MED ORDER — DARBEPOETIN ALFA 100 MCG/0.5ML IJ SOSY
100.0000 ug | PREFILLED_SYRINGE | INTRAMUSCULAR | Status: DC
  Administered 2016-05-17: 100 ug via SUBCUTANEOUS

## 2016-05-27 ENCOUNTER — Ambulatory Visit: Payer: BLUE CROSS/BLUE SHIELD

## 2016-05-31 ENCOUNTER — Encounter (HOSPITAL_COMMUNITY)
Admission: RE | Admit: 2016-05-31 | Discharge: 2016-05-31 | Disposition: A | Discharge status: Home / Self Care | Payer: BLUE CROSS/BLUE SHIELD | Source: Ambulatory Visit | Attending: Nephrology | Admitting: Nephrology

## 2016-05-31 ENCOUNTER — Ambulatory Visit: Payer: BLUE CROSS/BLUE SHIELD

## 2016-05-31 DIAGNOSIS — N184 Chronic kidney disease, stage 4 (severe): Secondary | ICD-10-CM | POA: Diagnosis not present

## 2016-05-31 LAB — IRON AND TIBC
IRON: 67 ug/dL (ref 28–170)
SATURATION RATIOS: 20 % (ref 10.4–31.8)
TIBC: 335 ug/dL (ref 250–450)
UIBC: 268 ug/dL

## 2016-05-31 LAB — POCT HEMOGLOBIN-HEMACUE: Hemoglobin: 11.1 g/dL — ABNORMAL LOW (ref 12.0–15.0)

## 2016-05-31 LAB — FERRITIN: Ferritin: 414 ng/mL — ABNORMAL HIGH (ref 11–307)

## 2016-05-31 MED ORDER — DARBEPOETIN ALFA 100 MCG/0.5ML IJ SOSY
PREFILLED_SYRINGE | INTRAMUSCULAR | Status: AC
  Administered 2016-05-31: 100 ug via SUBCUTANEOUS
  Filled 2016-05-31: qty 0.5

## 2016-05-31 MED ORDER — DARBEPOETIN ALFA 100 MCG/0.5ML IJ SOSY
100.0000 ug | PREFILLED_SYRINGE | INTRAMUSCULAR | Status: DC
  Administered 2016-05-31: 100 ug via SUBCUTANEOUS

## 2016-06-01 ENCOUNTER — Ambulatory Visit (INDEPENDENT_AMBULATORY_CARE_PROVIDER_SITE_OTHER): Payer: BLUE CROSS/BLUE SHIELD | Admitting: Physician Assistant

## 2016-06-01 VITALS — BP 140/60 | HR 69 | Temp 98.1°F

## 2016-06-01 DIAGNOSIS — Z23 Encounter for immunization: Secondary | ICD-10-CM

## 2016-06-01 NOTE — Progress Notes (Signed)
   Subjective:    Patient ID: Mariah [name], female    DOB: 29-Jul-1956, 60 y.o.   MRN: [id number]  HPI  Mariah [name] is here for 2 nd hep B vaccine and yearly flu vaccine.   Review of Systems     Objective:   Physical Exam        Assessment & Plan:  Patient tolerated injection well without complications. Advised to follow up for 3 rd hep B vaccine on or after 10/30/2016.

## 2016-06-02 ENCOUNTER — Telehealth: Payer: Self-pay | Admitting: Nurse Practitioner

## 2016-06-02 NOTE — Telephone Encounter (Signed)
Patient was called to get an update on renal transplant status.  There is a lady in [facility] who is going to be the donor if all test come out good.  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  Saw the post on face book and contacted her. The pt is going to [facility] on 9/13 for her studies, consult, and radiology screening.  She has been getting all of her immunizations up to date including Hep B.  She remains hopeful that this will help.  The nephrologist hopes to get the transplant before she needs dialysis but her labs are really elevated.  They are doing fund raising's to help.

## 2016-06-08 ENCOUNTER — Other Ambulatory Visit: Payer: Self-pay | Admitting: Physician Assistant

## 2016-06-08 ENCOUNTER — Other Ambulatory Visit: Payer: Self-pay | Admitting: Nurse Practitioner

## 2016-06-08 NOTE — Telephone Encounter (Signed)
Medication refill request: Flexeril  Last AEX:  09-09-15 Next AEX: 09-14-16 Last MMG (if hormonal medication request): 10-07-15 WNL Refill authorized: please advise

## 2016-06-10 ENCOUNTER — Encounter: Payer: Self-pay | Admitting: Physician Assistant

## 2016-06-15 ENCOUNTER — Encounter (HOSPITAL_COMMUNITY)
Admission: RE | Admit: 2016-06-15 | Discharge: 2016-06-15 | Disposition: A | Discharge status: Home / Self Care | Payer: BLUE CROSS/BLUE SHIELD | Source: Ambulatory Visit | Attending: Nephrology | Admitting: Nephrology

## 2016-06-15 DIAGNOSIS — N184 Chronic kidney disease, stage 4 (severe): Secondary | ICD-10-CM | POA: Diagnosis not present

## 2016-06-15 DIAGNOSIS — Z79899 Other long term (current) drug therapy: Secondary | ICD-10-CM | POA: Diagnosis not present

## 2016-06-15 DIAGNOSIS — D631 Anemia in chronic kidney disease: Secondary | ICD-10-CM | POA: Insufficient documentation

## 2016-06-15 DIAGNOSIS — Z5181 Encounter for therapeutic drug level monitoring: Secondary | ICD-10-CM | POA: Diagnosis not present

## 2016-06-15 LAB — POCT HEMOGLOBIN-HEMACUE: HEMOGLOBIN: 11.2 g/dL — AB (ref 12.0–15.0)

## 2016-06-15 MED ORDER — DARBEPOETIN ALFA 100 MCG/0.5ML IJ SOSY
PREFILLED_SYRINGE | INTRAMUSCULAR | Status: AC
  Administered 2016-06-15: 100 ug
  Filled 2016-06-15: qty 0.5

## 2016-06-28 ENCOUNTER — Encounter (HOSPITAL_COMMUNITY)
Admission: RE | Admit: 2016-06-28 | Discharge: 2016-06-28 | Disposition: A | Discharge status: Home / Self Care | Payer: BLUE CROSS/BLUE SHIELD | Source: Ambulatory Visit | Attending: Nephrology | Admitting: Nephrology

## 2016-06-28 DIAGNOSIS — N184 Chronic kidney disease, stage 4 (severe): Secondary | ICD-10-CM

## 2016-06-28 MED ORDER — DARBEPOETIN ALFA 100 MCG/0.5ML IJ SOSY: 100.0000 ug | PREFILLED_SYRINGE | INTRAMUSCULAR | Status: DC

## 2016-06-29 LAB — POCT HEMOGLOBIN-HEMACUE: HEMOGLOBIN: 12 g/dL (ref 12.0–15.0)

## 2016-07-12 ENCOUNTER — Encounter (HOSPITAL_COMMUNITY): Payer: BLUE CROSS/BLUE SHIELD

## 2016-07-13 ENCOUNTER — Other Ambulatory Visit: Payer: Self-pay | Admitting: Vascular Surgery

## 2016-07-13 DIAGNOSIS — Z01810 Encounter for preprocedural cardiovascular examination: Secondary | ICD-10-CM

## 2016-07-13 DIAGNOSIS — N184 Chronic kidney disease, stage 4 (severe): Secondary | ICD-10-CM

## 2016-07-26 ENCOUNTER — Encounter (HOSPITAL_COMMUNITY): Payer: BLUE CROSS/BLUE SHIELD

## 2016-08-05 ENCOUNTER — Other Ambulatory Visit: Payer: Self-pay | Admitting: Nurse Practitioner

## 2016-08-06 NOTE — Telephone Encounter (Signed)
Medication refill request: flexeril 5mg  Last AEX:  09-09-15 Next AEX: 09-14-16 Last MMG (if hormonal medication request): 10-03-15 category c density birads 1:neg Refill authorized: last given 06-08-16 30 with 1 refill. Please approve or deny

## 2016-08-11 ENCOUNTER — Encounter: Payer: Self-pay | Admitting: Vascular Surgery

## 2016-08-12 ENCOUNTER — Encounter: Payer: Self-pay | Admitting: Vascular Surgery

## 2016-08-12 ENCOUNTER — Ambulatory Visit (INDEPENDENT_AMBULATORY_CARE_PROVIDER_SITE_OTHER)
Admission: RE | Admit: 2016-08-12 | Discharge: 2016-08-12 | Disposition: A | Discharge status: Home / Self Care | Payer: BLUE CROSS/BLUE SHIELD | Source: Ambulatory Visit | Attending: Vascular Surgery | Admitting: Vascular Surgery

## 2016-08-12 ENCOUNTER — Ambulatory Visit (INDEPENDENT_AMBULATORY_CARE_PROVIDER_SITE_OTHER): Payer: BLUE CROSS/BLUE SHIELD | Admitting: Vascular Surgery

## 2016-08-12 ENCOUNTER — Ambulatory Visit (HOSPITAL_COMMUNITY)
Admission: RE | Admit: 2016-08-12 | Discharge: 2016-08-12 | Disposition: A | Discharge status: Home / Self Care | Payer: BLUE CROSS/BLUE SHIELD | Source: Ambulatory Visit | Attending: Vascular Surgery | Admitting: Vascular Surgery

## 2016-08-12 VITALS — BP 129/79 | HR 71 | Ht 62.25 in | Wt 184.8 lb

## 2016-08-12 DIAGNOSIS — N184 Chronic kidney disease, stage 4 (severe): Secondary | ICD-10-CM

## 2016-08-12 DIAGNOSIS — Z01810 Encounter for preprocedural cardiovascular examination: Secondary | ICD-10-CM

## 2016-08-12 NOTE — Progress Notes (Signed)
Referring Physician: Mercy Moore  Patient name: Mariah [name] MRN: [id number] DOB: Mar 19, 1956 Sex: female  REASON FOR CONSULT: Hemodialysis access  HPI: Mariah [name] is a 60 y.o. female referred by Dr. Mercy Moore for evaluation for placement of a hemodialysis access. She is also currently undergoing evaluation for kidney transplant. Her renal failure is thought to be secondary to polycystic kidney disease. She is currently CK D4. She is right-handed. Other medical problems include history of atrial fibrillation following Dr. Stanford Breed. She also has a history of gout hyperlipidemia hypertension and a remote history of a subdural hematoma. All of these of and stable.  Past Medical History:  Diagnosis Date  . Arthritis   . Atrial fibrillation ([place]) 2012  . Gout   . Hyperlipidemia   . Hypertension   . Polycystic kidney disease   . SDH (subdural hematoma) ([facility])    Past Surgical History:  Procedure Laterality Date  . COLONOSCOPY  03/2004   repeat 10 years  . Evacuation of subdural hematoma  11/26/2000  . ORIF ELBOW FRACTURE  10/22/2011   Procedure: OPEN REDUCTION INTERNAL FIXATION (ORIF) ELBOW/OLECRANON FRACTURE;  Surgeon: Schuyler Amor, MD;  Location: [facility];  Service: Orthopedics;  Laterality: Left;  open reduction  possible lateral reconstruction with palmaris graft from left or right side.  [name] Kitchen RADIAL HEAD IMPLANT  09/24/2011   Procedure: RADIAL HEAD IMPLANT;  Surgeon: Schuyler Amor, MD;  Location: [facility];  Service: Orthopedics;  Laterality: Left;  left radial head replacement  . TONSILLECTOMY      Family History  Problem Relation Age of Onset  . Polycystic kidney disease Father   . Heart failure Father   . Heart disease Mother     Rheumatic fever    SOCIAL HISTORY: Social History   Social History  . Marital status: Married    Spouse name: N/A  . Number of children: 0  . Years of education: N/A   Occupational History  .  OWNER [employer]   Social History Main Topics  . Smoking status: Former Smoker    Packs/day: 2.00    Years: 24.00    Types: Cigarettes    Quit date: 12/12/1996  . Smokeless tobacco: Never Used  . Alcohol use 1.2 oz/week    2 Glasses of wine per week     Comment: 2-3 glasses wine per day  . Drug use: No  . Sexual activity: Yes    Partners: Female   Other Topics Concern  . Not on file   Social History Narrative  . No narrative on file    Allergies  Allergen Reactions  . Codeine Itching  . Morphine Itching  . Zovirax [Acyclovir Sodium] Itching    Current Outpatient Prescriptions  Medication Sig Dispense Refill  . acetaminophen (TYLENOL) 500 MG tablet Take 1,000 mg by mouth every 6 (six) hours as needed for fever (pain).    [name] Kitchen amLODipine-atorvastatin ([name]) 10-80 MG tablet TAKE ONE TABLET BY MOUTH ONCE DAILY 30 tablet 3  . colchicine (COLCRYS) 0.6 MG tablet Take 1 tablet (0.6 mg total) by mouth See admin instructions. Take 1 tablet (0.6 mg) by mouth every hour for 3 hours when needed for gout attacks, repeat 2nd day, if gout continues on 3rd day consult with doctor 30 tablet 5  . cyclobenzaprine (FLEXERIL) 5 MG tablet TAKE ONE TABLET BY MOUTH THREE TIMES DAILY (Patient taking differently: TAKE ONE TABLET BY MOUTH DAILY) 30 tablet 1  . Febuxostat (  ULORIC) 80 MG TABS Take 80 mg by mouth daily with lunch.    . furosemide (LASIX) 40 MG tablet Take 40 mg by mouth daily after lunch.    . furosemide (LASIX) 80 MG tablet Take 80 mg by mouth.    [name] Kitchen HYDROmorphone (DILAUDID) 2 MG tablet Take 1 tablet (2 mg total) by mouth every 4 (four) hours as needed for severe pain. Reported on 11/21/2015 30 tablet 0  . loratadine (CLARITIN) 10 MG tablet Take 10 mg by mouth daily as needed (seasonal allergies). Daily with lunch    . losartan-hydrochlorothiazide (HYZAAR) 50-12.5 MG tablet Take 1 tablet by mouth 3 (three) times daily.    . magnesium oxide (MAG-OX) 400 MG tablet Take 1 tablet  (400 mg total) by mouth at bedtime as needed (cramping). 30 tablet 3  . metoprolol succinate (TOPROL-XL) 100 MG 24 hr tablet Take 100 mg by mouth at bedtime.  1  . Multiple Vitamin (MULTIVITAMIN WITH MINERALS) TABS tablet Take 1 tablet by mouth daily with lunch. Centrum Silver for Women    . Probiotic Product (PHILLIPS COLON HEALTH PO) Take 1 capsule by mouth daily as needed.     . promethazine (PHENERGAN) 25 MG suppository Place 1 suppository (25 mg total) rectally every 6 (six) hours as needed for nausea or vomiting. 12 each 2  . SODIUM BICARBONATE PO Take by mouth.    . venlafaxine (EFFEXOR) 37.5 MG tablet Take 37.5 mg by mouth every other day.     [name] Kitchen atorvastatin (LIPITOR) 80 MG tablet Take 1 tablet (80 mg total) by mouth at bedtime. (Patient not taking: Reported on 08/12/2016) 30 tablet 0  . cyclobenzaprine (FLEXERIL) 10 MG tablet Take 5 mg by mouth at bedtime as needed for muscle spasms.     . valsartan (DIOVAN) 160 MG tablet Take 160 mg by mouth every morning.     No current facility-administered medications for this visit.     ROS:   General:  No weight loss, Fever, chills  HEENT: No recent headaches, no nasal bleeding, no visual changes, no sore throat  Neurologic: No dizziness, blackouts, seizures. No recent symptoms of stroke or mini- stroke. No recent episodes of slurred speech, or temporary blindness.  Cardiac: No recent episodes of chest pain/pressure, no shortness of breath at rest.  No shortness of breath with exertion.  Denies history of atrial fibrillation or irregular heartbeat  Vascular: No history of rest pain in feet.  No history of claudication.  No history of non-healing ulcer, No history of DVT   Pulmonary: No home oxygen, no productive cough, no hemoptysis,  No asthma or wheezing  Musculoskeletal:  [ ]  Arthritis, [ ]  Low back pain,  [ ]  Joint pain  Hematologic:No history of hypercoagulable state.  No history of easy bleeding.  No history of  anemia  Gastrointestinal: No hematochezia or melena,  No gastroesophageal reflux, no trouble swallowing  Urinary: [X]  chronic Kidney disease, [ ]  on HD - [ ]  MWF or [ ]  TTHS, [ ]  Burning with urination, [ ]  Frequent urination, [ ]  Difficulty urinating;   Skin: No rashes  Psychological: No history of anxiety,  No history of depression   Physical Examination  Vitals:   08/12/16 1159  BP: 129/79  Pulse: 71  SpO2: 98%  Weight: 184 lb 12.8 oz (83.8 kg)  Height: 5' 2.25" (1.581 m)    Body mass index is 33.53 kg/m.  General:  Alert and oriented, no acute distress HEENT: Normal Neck: No bruit  or JVD Pulmonary: Clear to auscultation bilaterally Cardiac: Regular Rate and Rhythm without murmur Abdomen: Soft, non-tender, non-distended, no mass, no scars Skin: No rash Extremity Pulses:  2+ radial, brachial, femoral, dorsalis pedis, posterior tibial pulses bilaterally Musculoskeletal: No deformity or edema  Neurologic: Upper and lower extremity motor 5/5 and symmetric  DATA:  Patient had bilateral upper extremity vein mapping as well as arterial duplex exam. She has poor quality veins in the right arm. She does have a reasonable left basilic vein in the left arm which is 3-4 mm diameter. She had normal arterial anatomy bilaterally.  ASSESSMENT:  She needs long-term hemodialysis access. We will plan for a left basilic vein transposition fistula versus AV graft depending on the quality of the vein time of operation. This is scheduled for 09/06/2016. Risks benefits possible complications and procedure details including not limited to bleeding infection ischemic steal non-maturation of the fistula were explained to the patient today and she wishes to proceed.   PLAN:  See above  Ruta Hinds, MD [facility] Office: [phone] Pager: [phone]

## 2016-08-16 ENCOUNTER — Encounter (HOSPITAL_COMMUNITY)
Admission: RE | Admit: 2016-08-16 | Discharge: 2016-08-16 | Disposition: A | Discharge status: Home / Self Care | Payer: BLUE CROSS/BLUE SHIELD | Source: Ambulatory Visit | Attending: Nephrology | Admitting: Nephrology

## 2016-08-16 DIAGNOSIS — D631 Anemia in chronic kidney disease: Secondary | ICD-10-CM | POA: Diagnosis not present

## 2016-08-16 DIAGNOSIS — N184 Chronic kidney disease, stage 4 (severe): Secondary | ICD-10-CM | POA: Diagnosis not present

## 2016-08-16 DIAGNOSIS — Z79899 Other long term (current) drug therapy: Secondary | ICD-10-CM | POA: Insufficient documentation

## 2016-08-16 DIAGNOSIS — Z5181 Encounter for therapeutic drug level monitoring: Secondary | ICD-10-CM | POA: Insufficient documentation

## 2016-08-16 LAB — IRON AND TIBC
Iron: 64 ug/dL (ref 28–170)
SATURATION RATIOS: 20 % (ref 10.4–31.8)
TIBC: 316 ug/dL (ref 250–450)
UIBC: 252 ug/dL

## 2016-08-16 LAB — FERRITIN: Ferritin: 493 ng/mL — ABNORMAL HIGH (ref 11–307)

## 2016-08-16 MED ORDER — DARBEPOETIN ALFA 100 MCG/0.5ML IJ SOSY
PREFILLED_SYRINGE | INTRAMUSCULAR | Status: AC
Start: 2016-08-16 — End: 2016-08-16
  Administered 2016-08-16: 100 ug via SUBCUTANEOUS
  Filled 2016-08-16: qty 0.5

## 2016-08-16 MED ORDER — DARBEPOETIN ALFA 100 MCG/0.5ML IJ SOSY
100.0000 ug | PREFILLED_SYRINGE | INTRAMUSCULAR | Status: DC
  Administered 2016-08-16: 100 ug via SUBCUTANEOUS

## 2016-08-17 ENCOUNTER — Other Ambulatory Visit: Payer: Self-pay

## 2016-08-17 LAB — POCT HEMOGLOBIN-HEMACUE: Hemoglobin: 10.3 g/dL — ABNORMAL LOW (ref 12.0–15.0)

## 2016-08-19 ENCOUNTER — Encounter: Payer: Self-pay | Admitting: Physician Assistant

## 2016-09-01 ENCOUNTER — Encounter (HOSPITAL_COMMUNITY): Payer: Self-pay | Admitting: *Deleted

## 2016-09-01 NOTE — Progress Notes (Signed)
Spoke with pt for pre-op call. Pt does have history of A-fib, denies any chest pain or sob.

## 2016-09-06 ENCOUNTER — Encounter (HOSPITAL_COMMUNITY): Payer: BLUE CROSS/BLUE SHIELD

## 2016-09-06 NOTE — Anesthesia Preprocedure Evaluation (Addendum)
Anesthesia Evaluation  Patient identified by MRN, date of birth, ID band Patient awake    Reviewed: Allergy & Precautions, NPO status , Patient's Chart, lab work & pertinent test results  History of Anesthesia Complications (+) PONV and history of anesthetic complications  Airway Mallampati: II  TM Distance: >3 FB Neck ROM: Limited    Dental  (+) Teeth Intact, Dental Advisory Given   Pulmonary former smoker,    Pulmonary exam normal        Cardiovascular hypertension, Normal cardiovascular exam     Neuro/Psych PSYCHIATRIC DISORDERS Anxiety negative neurological ROS     GI/Hepatic negative GI ROS, Neg liver ROS,   Endo/Other  negative endocrine ROS  Renal/GU ESRFRenal disease     Musculoskeletal   Abdominal   Peds  Hematology   Anesthesia Other Findings   Reproductive/Obstetrics                            Anesthesia Physical Anesthesia Plan  ASA: III  Anesthesia Plan: General   Post-op Pain Management:    Induction: Intravenous  Airway Management Planned: LMA  Additional Equipment:   Intra-op Plan:   Post-operative Plan: Extubation in OR  Informed Consent: I have reviewed the patients History and Physical, chart, labs and discussed the procedure including the risks, benefits and alternatives for the proposed anesthesia with the patient or authorized representative who has indicated his/her understanding and acceptance.   Dental advisory given  Plan Discussed with: CRNA and Anesthesiologist  Anesthesia Plan Comments:        Anesthesia Quick Evaluation

## 2016-09-07 ENCOUNTER — Other Ambulatory Visit: Payer: Self-pay | Admitting: *Deleted

## 2016-09-07 ENCOUNTER — Ambulatory Visit (HOSPITAL_COMMUNITY): Payer: BLUE CROSS/BLUE SHIELD | Admitting: Certified Registered Nurse Anesthetist

## 2016-09-07 ENCOUNTER — Encounter (HOSPITAL_COMMUNITY)
Admission: RE | Disposition: A | Discharge status: Home / Self Care | Payer: Self-pay | Source: Ambulatory Visit | Attending: Vascular Surgery

## 2016-09-07 ENCOUNTER — Ambulatory Visit (HOSPITAL_COMMUNITY)
Admission: RE | Admit: 2016-09-07 | Discharge: 2016-09-07 | Disposition: A | Discharge status: Home / Self Care | Payer: BLUE CROSS/BLUE SHIELD | Source: Ambulatory Visit | Attending: Vascular Surgery | Admitting: Vascular Surgery

## 2016-09-07 ENCOUNTER — Encounter (HOSPITAL_COMMUNITY): Payer: Self-pay | Admitting: Certified Registered Nurse Anesthetist

## 2016-09-07 DIAGNOSIS — Q613 Polycystic kidney, unspecified: Secondary | ICD-10-CM | POA: Diagnosis not present

## 2016-09-07 DIAGNOSIS — Z79899 Other long term (current) drug therapy: Secondary | ICD-10-CM | POA: Insufficient documentation

## 2016-09-07 DIAGNOSIS — I120 Hypertensive chronic kidney disease with stage 5 chronic kidney disease or end stage renal disease: Secondary | ICD-10-CM | POA: Insufficient documentation

## 2016-09-07 DIAGNOSIS — E785 Hyperlipidemia, unspecified: Secondary | ICD-10-CM | POA: Insufficient documentation

## 2016-09-07 DIAGNOSIS — N184 Chronic kidney disease, stage 4 (severe): Secondary | ICD-10-CM

## 2016-09-07 DIAGNOSIS — M109 Gout, unspecified: Secondary | ICD-10-CM | POA: Diagnosis not present

## 2016-09-07 DIAGNOSIS — N186 End stage renal disease: Secondary | ICD-10-CM | POA: Insufficient documentation

## 2016-09-07 DIAGNOSIS — Z87891 Personal history of nicotine dependence: Secondary | ICD-10-CM | POA: Diagnosis not present

## 2016-09-07 DIAGNOSIS — Z4931 Encounter for adequacy testing for hemodialysis: Secondary | ICD-10-CM

## 2016-09-07 HISTORY — DX: Anemia, unspecified: D64.9

## 2016-09-07 HISTORY — DX: Nausea with vomiting, unspecified: R11.2

## 2016-09-07 HISTORY — PX: BASCILIC VEIN TRANSPOSITION: SHX5742

## 2016-09-07 HISTORY — DX: Other specified postprocedural states: Z98.890

## 2016-09-07 HISTORY — DX: Pneumonia, unspecified organism: J18.9

## 2016-09-07 HISTORY — DX: Personal history of urinary calculi: Z87.442

## 2016-09-07 LAB — POCT I-STAT 4, (NA,K, GLUC, HGB,HCT)
Glucose, Bld: 100 mg/dL — ABNORMAL HIGH (ref 65–99)
HEMATOCRIT: 29 % — AB (ref 36.0–46.0)
HEMOGLOBIN: 9.9 g/dL — AB (ref 12.0–15.0)
Potassium: 4.3 mmol/L (ref 3.5–5.1)
SODIUM: 138 mmol/L (ref 135–145)

## 2016-09-07 SURGERY — TRANSPOSITION, VEIN, BASILIC
Anesthesia: General | Site: Arm Upper | Laterality: Left

## 2016-09-07 MED ORDER — ONDANSETRON HCL 4 MG/2ML IJ SOLN
INTRAMUSCULAR | Status: AC
  Filled 2016-09-07: qty 2

## 2016-09-07 MED ORDER — HYDROMORPHONE HCL 1 MG/ML IJ SOLN
0.2500 mg | INTRAMUSCULAR | Status: DC | PRN
  Administered 2016-09-07: 0.5 mg via INTRAVENOUS
  Administered 2016-09-07 (×2): 0.25 mg via INTRAVENOUS

## 2016-09-07 MED ORDER — FENTANYL CITRATE (PF) 100 MCG/2ML IJ SOLN
INTRAMUSCULAR | Status: AC
  Filled 2016-09-07: qty 2

## 2016-09-07 MED ORDER — DEXAMETHASONE SODIUM PHOSPHATE 10 MG/ML IJ SOLN
INTRAMUSCULAR | Status: DC | PRN
  Administered 2016-09-07: 10 mg via INTRAVENOUS

## 2016-09-07 MED ORDER — PROPOFOL 10 MG/ML IV BOLUS
INTRAVENOUS | Status: AC
Start: 2016-09-07 — End: 2016-09-07
  Filled 2016-09-07: qty 40

## 2016-09-07 MED ORDER — ONDANSETRON HCL 4 MG/2ML IJ SOLN
INTRAMUSCULAR | Status: DC | PRN
  Administered 2016-09-07: 4 mg via INTRAVENOUS

## 2016-09-07 MED ORDER — HEPARIN SODIUM (PORCINE) 5000 UNIT/ML IJ SOLN
INTRAMUSCULAR | Status: DC | PRN
  Administered 2016-09-07: 07:00:00

## 2016-09-07 MED ORDER — PROTAMINE SULFATE 10 MG/ML IV SOLN
INTRAVENOUS | Status: DC | PRN
  Administered 2016-09-07: 50 mg via INTRAVENOUS

## 2016-09-07 MED ORDER — SCOPOLAMINE 1 MG/3DAYS TD PT72
MEDICATED_PATCH | TRANSDERMAL | Status: AC
  Filled 2016-09-07: qty 1

## 2016-09-07 MED ORDER — PROPOFOL 10 MG/ML IV BOLUS
INTRAVENOUS | Status: DC | PRN
  Administered 2016-09-07: 30 mg via INTRAVENOUS
  Administered 2016-09-07: 40 mg via INTRAVENOUS
  Administered 2016-09-07: 160 mg via INTRAVENOUS

## 2016-09-07 MED ORDER — SODIUM CHLORIDE 0.9 % IV SOLN: INTRAVENOUS | Status: DC

## 2016-09-07 MED ORDER — SCOPOLAMINE 1 MG/3DAYS TD PT72
1.0000 | MEDICATED_PATCH | TRANSDERMAL | Status: DC
  Administered 2016-09-07: 1 via TRANSDERMAL

## 2016-09-07 MED ORDER — CHLORHEXIDINE GLUCONATE CLOTH 2 % EX PADS: 6.0000 | MEDICATED_PAD | Freq: Once | CUTANEOUS | Status: DC

## 2016-09-07 MED ORDER — MIDAZOLAM HCL 2 MG/2ML IJ SOLN
INTRAMUSCULAR | Status: AC
  Filled 2016-09-07: qty 2

## 2016-09-07 MED ORDER — OXYCODONE HCL 5 MG PO TABS
ORAL_TABLET | ORAL | Status: AC
  Filled 2016-09-07: qty 1

## 2016-09-07 MED ORDER — PHENYLEPHRINE 40 MCG/ML (10ML) SYRINGE FOR IV PUSH (FOR BLOOD PRESSURE SUPPORT)
PREFILLED_SYRINGE | INTRAVENOUS | Status: DC | PRN
  Administered 2016-09-07 (×2): 80 ug via INTRAVENOUS

## 2016-09-07 MED ORDER — SODIUM CHLORIDE 0.9 % IV SOLN
INTRAVENOUS | Status: DC | PRN
  Administered 2016-09-07: 07:00:00 via INTRAVENOUS

## 2016-09-07 MED ORDER — PHENYLEPHRINE HCL 10 MG/ML IJ SOLN
INTRAVENOUS | Status: DC | PRN
  Administered 2016-09-07: 15 ug/min via INTRAVENOUS

## 2016-09-07 MED ORDER — HYDROMORPHONE HCL 1 MG/ML IJ SOLN
INTRAMUSCULAR | Status: AC
  Filled 2016-09-07: qty 0.5

## 2016-09-07 MED ORDER — FENTANYL CITRATE (PF) 100 MCG/2ML IJ SOLN
INTRAMUSCULAR | Status: DC | PRN
  Administered 2016-09-07: 25 ug via INTRAVENOUS
  Administered 2016-09-07: 50 ug via INTRAVENOUS
  Administered 2016-09-07 (×4): 25 ug via INTRAVENOUS
  Administered 2016-09-07: 12.5 ug via INTRAVENOUS

## 2016-09-07 MED ORDER — MIDAZOLAM HCL 5 MG/5ML IJ SOLN
INTRAMUSCULAR | Status: DC | PRN
  Administered 2016-09-07: 2 mg via INTRAVENOUS

## 2016-09-07 MED ORDER — PROMETHAZINE HCL 25 MG PO TABS: 25.0000 mg | ORAL_TABLET | Freq: Four times a day (QID) | ORAL | 1 refills | Status: DC | PRN

## 2016-09-07 MED ORDER — PHENYLEPHRINE 40 MCG/ML (10ML) SYRINGE FOR IV PUSH (FOR BLOOD PRESSURE SUPPORT)
PREFILLED_SYRINGE | INTRAVENOUS | Status: AC
  Filled 2016-09-07: qty 10

## 2016-09-07 MED ORDER — HYDROMORPHONE HCL 2 MG/ML IJ SOLN
INTRAMUSCULAR | Status: AC
  Filled 2016-09-07: qty 1

## 2016-09-07 MED ORDER — PROMETHAZINE HCL 25 MG/ML IJ SOLN: 6.2500 mg | INTRAMUSCULAR | Status: DC | PRN

## 2016-09-07 MED ORDER — DEXTROSE 5 % IV SOLN
1.5000 g | INTRAVENOUS | Status: AC
  Administered 2016-09-07: 1.5 g via INTRAVENOUS
  Filled 2016-09-07: qty 1.5

## 2016-09-07 MED ORDER — LIDOCAINE 2% (20 MG/ML) 5 ML SYRINGE
INTRAMUSCULAR | Status: DC | PRN
  Administered 2016-09-07: 100 mg via INTRAVENOUS

## 2016-09-07 MED ORDER — HYDROMORPHONE HCL 2 MG PO TABS: 2.0000 mg | ORAL_TABLET | ORAL | 0 refills | Status: DC | PRN

## 2016-09-07 MED ORDER — DEXAMETHASONE SODIUM PHOSPHATE 10 MG/ML IJ SOLN
INTRAMUSCULAR | Status: AC
  Filled 2016-09-07: qty 1

## 2016-09-07 MED ORDER — HEPARIN SODIUM (PORCINE) 1000 UNIT/ML IJ SOLN
INTRAMUSCULAR | Status: DC | PRN
  Administered 2016-09-07: 5000 [IU] via INTRAVENOUS

## 2016-09-07 MED ORDER — OXYCODONE HCL 5 MG PO TABS
5.0000 mg | ORAL_TABLET | Freq: Once | ORAL | Status: AC
  Administered 2016-09-07: 5 mg via ORAL

## 2016-09-07 MED ORDER — 0.9 % SODIUM CHLORIDE (POUR BTL) OPTIME
TOPICAL | Status: DC | PRN
  Administered 2016-09-07: 1000 mL

## 2016-09-07 MED ORDER — LIDOCAINE 2% (20 MG/ML) 5 ML SYRINGE
INTRAMUSCULAR | Status: AC
  Filled 2016-09-07: qty 5

## 2016-09-07 MED ORDER — AMLODIPINE-ATORVASTATIN 10-80 MG PO TABS: 1.0000 | ORAL_TABLET | Freq: Every evening | ORAL | Status: DC

## 2016-09-07 SURGICAL SUPPLY — 40 items
ADH SKN CLS APL DERMABOND .7 (GAUZE/BANDAGES/DRESSINGS) ×2
AGENT HMST SPONGE THK3/8 (HEMOSTASIS)
ARMBAND PINK RESTRICT EXTREMIT (MISCELLANEOUS) ×2 IMPLANT
CANISTER SUCTION 2500CC (MISCELLANEOUS) ×2 IMPLANT
CANNULA VESSEL 3MM 2 BLNT TIP (CANNULA) ×2 IMPLANT
CLIP TI MEDIUM 24 (CLIP) ×2 IMPLANT
CLIP TI WIDE RED SMALL 24 (CLIP) ×2 IMPLANT
COVER PROBE W GEL 5X96 (DRAPES) ×1 IMPLANT
DECANTER SPIKE VIAL GLASS SM (MISCELLANEOUS) ×2 IMPLANT
DERMABOND ADVANCED (GAUZE/BANDAGES/DRESSINGS) ×2
DERMABOND ADVANCED .7 DNX12 (GAUZE/BANDAGES/DRESSINGS) ×1 IMPLANT
ELECT REM PT RETURN 9FT ADLT (ELECTROSURGICAL) ×2
ELECTRODE REM PT RTRN 9FT ADLT (ELECTROSURGICAL) ×1 IMPLANT
GLOVE BIO SURGEON STRL SZ 6.5 (GLOVE) ×2 IMPLANT
GLOVE BIO SURGEON STRL SZ7.5 (GLOVE) ×2 IMPLANT
GLOVE BIOGEL PI IND STRL 6.5 (GLOVE) IMPLANT
GLOVE BIOGEL PI INDICATOR 6.5 (GLOVE) ×2
GLOVE SURG SS PI 7.0 STRL IVOR (GLOVE) ×3 IMPLANT
GOWN STRL REUS W/ TWL LRG LVL3 (GOWN DISPOSABLE) ×3 IMPLANT
GOWN STRL REUS W/ TWL XL LVL3 (GOWN DISPOSABLE) IMPLANT
GOWN STRL REUS W/TWL LRG LVL3 (GOWN DISPOSABLE) ×6
GOWN STRL REUS W/TWL XL LVL3 (GOWN DISPOSABLE) ×6
HEMOSTAT SPONGE AVITENE ULTRA (HEMOSTASIS) IMPLANT
KIT BASIN OR (CUSTOM PROCEDURE TRAY) ×2 IMPLANT
KIT ROOM TURNOVER OR (KITS) ×2 IMPLANT
LOOP VESSEL MINI RED (MISCELLANEOUS) IMPLANT
NS IRRIG 1000ML POUR BTL (IV SOLUTION) ×2 IMPLANT
PACK CV ACCESS (CUSTOM PROCEDURE TRAY) ×2 IMPLANT
PAD ARMBOARD 7.5X6 YLW CONV (MISCELLANEOUS) ×4 IMPLANT
SUT PROLENE 6 0 BV (SUTURE) ×1 IMPLANT
SUT PROLENE 7 0 BV 1 (SUTURE) ×2 IMPLANT
SUT SILK 2 0 FS (SUTURE) ×1 IMPLANT
SUT SILK 3 0 (SUTURE) ×4
SUT SILK 3-0 18XBRD TIE 12 (SUTURE) IMPLANT
SUT VIC AB 3-0 SH 27 (SUTURE) ×8
SUT VIC AB 3-0 SH 27X BRD (SUTURE) ×1 IMPLANT
SUT VICRYL 4-0 PS2 18IN ABS (SUTURE) ×5 IMPLANT
TAPE UMBILICAL COTTON 1/8X30 (MISCELLANEOUS) ×1 IMPLANT
UNDERPAD 30X30 (UNDERPADS AND DIAPERS) ×2 IMPLANT
WATER STERILE IRR 1000ML POUR (IV SOLUTION) ×2 IMPLANT

## 2016-09-07 NOTE — Anesthesia Postprocedure Evaluation (Signed)
Anesthesia Post Note  Patient: Mariah [name]  Procedure(s) Performed: Procedure(s) (LRB): BASILIC VEIN TRANSPOSITION LEFT UPPER ARM (Left)  Patient location during evaluation: PACU Anesthesia Type: General Level of consciousness: sedated Pain management: pain level controlled Vital Signs Assessment: post-procedure vital signs reviewed and stable Respiratory status: spontaneous breathing and respiratory function stable Cardiovascular status: stable Anesthetic complications: no    Last Vitals:  Vitals:   09/07/16 1109 09/07/16 1120  BP:  132/68  Pulse: 79 82  Resp: 16 16  Temp: 36.7 C     Last Pain:  Vitals:   09/07/16 1109  TempSrc:   PainSc: [place]

## 2016-09-07 NOTE — H&P (View-Only) (Signed)
Referring Physician: Mercy Moore  Patient name: Mariah [name] MRN: [id number] DOB: June 04, 1956 Sex: female  REASON FOR CONSULT: Hemodialysis access  HPI: Mariah [name] is a 60 y.o. female referred by Dr. Mercy Moore for evaluation for placement of a hemodialysis access. She is also currently undergoing evaluation for kidney transplant. Her renal failure is thought to be secondary to polycystic kidney disease. She is currently CK D4. She is right-handed. Other medical problems include history of atrial fibrillation following Dr. Stanford Breed. She also has a history of gout hyperlipidemia hypertension and a remote history of a subdural hematoma. All of these of and stable.  Past Medical History:  Diagnosis Date  . Arthritis   . Atrial fibrillation ([place]) 2012  . Gout   . Hyperlipidemia   . Hypertension   . Polycystic kidney disease   . SDH (subdural hematoma) (HCC)    Past Surgical History:  Procedure Laterality Date  . COLONOSCOPY  03/2004   repeat 10 years  . Evacuation of subdural hematoma  11/26/2000  . ORIF ELBOW FRACTURE  10/22/2011   Procedure: OPEN REDUCTION INTERNAL FIXATION (ORIF) ELBOW/OLECRANON FRACTURE;  Surgeon: Schuyler Amor, MD;  Location: [facility];  Service: Orthopedics;  Laterality: Left;  open reduction  possible lateral reconstruction with palmaris graft from left or right side.  [name] Kitchen RADIAL HEAD IMPLANT  09/24/2011   Procedure: RADIAL HEAD IMPLANT;  Surgeon: Schuyler Amor, MD;  Location: [facility];  Service: Orthopedics;  Laterality: Left;  left radial head replacement  . TONSILLECTOMY      Family History  Problem Relation Age of Onset  . Polycystic kidney disease Father   . Heart failure Father   . Heart disease Mother     Rheumatic fever    SOCIAL HISTORY: Social History   Social History  . Marital status: Married    Spouse name: N/A  . Number of children: 0  . Years of education: N/A   Occupational History  .  OWNER [employer]   Social History Main Topics  . Smoking status: Former Smoker    Packs/day: 2.00    Years: 24.00    Types: Cigarettes    Quit date: 12/12/1996  . Smokeless tobacco: Never Used  . Alcohol use 1.2 oz/week    2 Glasses of wine per week     Comment: 2-3 glasses wine per day  . Drug use: No  . Sexual activity: Yes    Partners: Female   Other Topics Concern  . Not on file   Social History Narrative  . No narrative on file    Allergies  Allergen Reactions  . Codeine Itching  . Morphine Itching  . Zovirax [Acyclovir Sodium] Itching    Current Outpatient Prescriptions  Medication Sig Dispense Refill  . acetaminophen (TYLENOL) 500 MG tablet Take 1,000 mg by mouth every 6 (six) hours as needed for fever (pain).    [name] Kitchen amLODipine-atorvastatin ([name]) 10-80 MG tablet TAKE ONE TABLET BY MOUTH ONCE DAILY 30 tablet 3  . colchicine (COLCRYS) 0.6 MG tablet Take 1 tablet (0.6 mg total) by mouth See admin instructions. Take 1 tablet (0.6 mg) by mouth every hour for 3 hours when needed for gout attacks, repeat 2nd day, if gout continues on 3rd day consult with doctor 30 tablet 5  . cyclobenzaprine (FLEXERIL) 5 MG tablet TAKE ONE TABLET BY MOUTH THREE TIMES DAILY (Patient taking differently: TAKE ONE TABLET BY MOUTH DAILY) 30 tablet 1  . Febuxostat (  ULORIC) 80 MG TABS Take 80 mg by mouth daily with lunch.    . furosemide (LASIX) 40 MG tablet Take 40 mg by mouth daily after lunch.    . furosemide (LASIX) 80 MG tablet Take 80 mg by mouth.    [name] Kitchen HYDROmorphone (DILAUDID) 2 MG tablet Take 1 tablet (2 mg total) by mouth every 4 (four) hours as needed for severe pain. Reported on 11/21/2015 30 tablet 0  . loratadine (CLARITIN) 10 MG tablet Take 10 mg by mouth daily as needed (seasonal allergies). Daily with lunch    . losartan-hydrochlorothiazide (HYZAAR) 50-12.5 MG tablet Take 1 tablet by mouth 3 (three) times daily.    . magnesium oxide (MAG-OX) 400 MG tablet Take 1 tablet  (400 mg total) by mouth at bedtime as needed (cramping). 30 tablet 3  . metoprolol succinate (TOPROL-XL) 100 MG 24 hr tablet Take 100 mg by mouth at bedtime.  1  . Multiple Vitamin (MULTIVITAMIN WITH MINERALS) TABS tablet Take 1 tablet by mouth daily with lunch. Centrum Silver for Women    . Probiotic Product (PHILLIPS COLON HEALTH PO) Take 1 capsule by mouth daily as needed.     . promethazine (PHENERGAN) 25 MG suppository Place 1 suppository (25 mg total) rectally every 6 (six) hours as needed for nausea or vomiting. 12 each 2  . SODIUM BICARBONATE PO Take by mouth.    . venlafaxine (EFFEXOR) 37.5 MG tablet Take 37.5 mg by mouth every other day.     [name] Kitchen atorvastatin (LIPITOR) 80 MG tablet Take 1 tablet (80 mg total) by mouth at bedtime. (Patient not taking: Reported on 08/12/2016) 30 tablet 0  . cyclobenzaprine (FLEXERIL) 10 MG tablet Take 5 mg by mouth at bedtime as needed for muscle spasms.     . valsartan (DIOVAN) 160 MG tablet Take 160 mg by mouth every morning.     No current facility-administered medications for this visit.     ROS:   General:  No weight loss, Fever, chills  HEENT: No recent headaches, no nasal bleeding, no visual changes, no sore throat  Neurologic: No dizziness, blackouts, seizures. No recent symptoms of stroke or mini- stroke. No recent episodes of slurred speech, or temporary blindness.  Cardiac: No recent episodes of chest pain/pressure, no shortness of breath at rest.  No shortness of breath with exertion.  Denies history of atrial fibrillation or irregular heartbeat  Vascular: No history of rest pain in feet.  No history of claudication.  No history of non-healing ulcer, No history of DVT   Pulmonary: No home oxygen, no productive cough, no hemoptysis,  No asthma or wheezing  Musculoskeletal:  [ ]  Arthritis, [ ]  Low back pain,  [ ]  Joint pain  Hematologic:No history of hypercoagulable state.  No history of easy bleeding.  No history of  anemia  Gastrointestinal: No hematochezia or melena,  No gastroesophageal reflux, no trouble swallowing  Urinary: [X]  chronic Kidney disease, [ ]  on HD - [ ]  MWF or [ ]  TTHS, [ ]  Burning with urination, [ ]  Frequent urination, [ ]  Difficulty urinating;   Skin: No rashes  Psychological: No history of anxiety,  No history of depression   Physical Examination  Vitals:   08/12/16 1159  BP: 129/79  Pulse: 71  SpO2: 98%  Weight: 184 lb 12.8 oz (83.8 kg)  Height: 5' 2.25" (1.581 m)    Body mass index is 33.53 kg/m.  General:  Alert and oriented, no acute distress HEENT: Normal Neck: No bruit  or JVD Pulmonary: Clear to auscultation bilaterally Cardiac: Regular Rate and Rhythm without murmur Abdomen: Soft, non-tender, non-distended, no mass, no scars Skin: No rash Extremity Pulses:  2+ radial, brachial, femoral, dorsalis pedis, posterior tibial pulses bilaterally Musculoskeletal: No deformity or edema  Neurologic: Upper and lower extremity motor 5/5 and symmetric  DATA:  Patient had bilateral upper extremity vein mapping as well as arterial duplex exam. She has poor quality veins in the right arm. She does have a reasonable left basilic vein in the left arm which is 3-4 mm diameter. She had normal arterial anatomy bilaterally.  ASSESSMENT:  She needs long-term hemodialysis access. We will plan for a left basilic vein transposition fistula versus AV graft depending on the quality of the vein time of operation. This is scheduled for 09/06/2016. Risks benefits possible complications and procedure details including not limited to bleeding infection ischemic steal non-maturation of the fistula were explained to the patient today and she wishes to proceed.   PLAN:  See above  Ruta Hinds, MD [facility] Office: [phone] Pager: [phone]

## 2016-09-07 NOTE — Anesthesia Procedure Notes (Signed)
Procedure Name: LMA Insertion Date/Time: 09/07/2016 7:40 AM Performed by: Everlean Cherry A Pre-anesthesia Checklist: Patient identified, Emergency Drugs available, Suction available and Patient being monitored Patient Re-evaluated:Patient Re-evaluated prior to inductionOxygen Delivery Method: Circle system utilized Preoxygenation: Pre-oxygenation with 100% oxygen Intubation Type: IV induction Ventilation: Mask ventilation without difficulty LMA: LMA inserted LMA Size: 4.0 Number of attempts: 1 Placement Confirmation: positive ETCO2 and breath sounds checked- equal and bilateral Tube secured with: Tape Dental Injury: Teeth and Oropharynx as per pre-operative assessment

## 2016-09-07 NOTE — Interval H&P Note (Signed)
History and Physical Interval Note:  09/07/2016 7:25 AM  Mariah [name]  has presented today for surgery, with the diagnosis of Stage IV Chronic Kidney Disease N18.4  The various methods of treatment have been discussed with the patient and family. After consideration of risks, benefits and other options for treatment, the patient has consented to  Procedure(s): BASILIC VEIN TRANSPOSITION VERSUS ARTERIOVENOUS GRAFT INSERTION (Left) as a surgical intervention .  The patient's history has been reviewed, patient examined, no change in status, stable for surgery.  I have reviewed the patient's chart and labs.  Questions were answered to the patient's satisfaction.     Ruta Hinds

## 2016-09-07 NOTE — Transfer of Care (Signed)
Immediate Anesthesia Transfer of Care Note  Patient: Mariah [name]  Procedure(s) Performed: Procedure(s): BASILIC VEIN TRANSPOSITION LEFT UPPER ARM (Left)  Patient Location: PACU  Anesthesia Type:General  Level of Consciousness: awake, alert , oriented and patient cooperative  Airway & Oxygen Therapy: Patient Spontanous Breathing and Patient connected to nasal cannula oxygen  Post-op Assessment: Report given to RN, Post -op Vital signs reviewed and stable and Patient moving all extremities X 4  Post vital signs: Reviewed and stable  Last Vitals:  Vitals:   09/07/16 0716 09/07/16 1000  BP: (!) 151/81   Pulse: 72   Resp: 16   Temp: 36.7 C 37.4 C    Last Pain:  Vitals:   09/07/16 0716  TempSrc: Oral      Patients Stated Pain Goal: 3 ([id number])  Complications: No apparent anesthesia complications

## 2016-09-07 NOTE — Op Note (Signed)
Procedure: Left basilic vein transposition fistula  Preoperative diagnosis: End-stage renal disease  Postoperative diagnosis: Same  Anesthesia: Gen.  Assistant: Silva Bandy PA-C  Operative findings: 3-4 mm left basilic vein, 3 mm left brachial artery  Operative details: After obtaining informed consent, the patient was taken to the operating room. The patient was placed in supine position operating table. After induction of general anesthesia, the patient's entire left upper extremity was prepped and draped in the usual sterile fashion. Next ultrasound was used to identify the left basilic vein. A longitudinal incision was made just above the antecubital crease in order to expose the basilic vein. The vein was of good quality approximately 3-5 mm in diameter throughout its course. Several longitudinal skip incisions were made up the arm from just below the antecubital crease all the way up to the axilla to harvest the basilic vein. Care was taken to try to not injure any sensory nerves and all the motor nerves were identified and protected. The vein was dissected free circumferentially and small side branches ligated and divided between silk ties or clips. Next the brachial artery was exposed by deepening the basilic vein harvest incision just above the antecubital crease. The artery was approximately 3 mm in diameter. This was dissected free circumferentially. Vessel loops were placed around it. There was some spasm within the artery after dissecting it free. Next the distal basilic vein was ligated with a 2 0 silk tie and the vein transected. The vein was brought out throughout the skip incisions and gently distended with heparinized saline and marked for orientation. The vein was then tunneled subcutaneously in an arcing configuration out over the biceps muscle down to the level of the exposed brachial artery just above the antecubital crease. The patient was given 5000 units of intravenous heparin.  Vessel loops were used to control the artery proximally and distally. The vein was cut to length and sewn end of vein to side of artery using a running 7-0 Prolene suture. Just prior completion of the anastomosis, it was forebled backbled and thoroughly flushed. The anastomosis was secured Vesseloops were released.  There was a palpable thrill immediately in the proximal aspect of the fistula. The distal vein was also noted to fill fully. At this point all subcutaneous tissues were reapproximated using running 3-0 Vicryl suture. All skin incisions were closed with running 4  0 Vicryl subcuticular stitch. Dermabond was applied to all incisions. The patient tolerated the procedure well and there were no complications. Instrument sponge needle counts were correct at the end of the case. The patient was taken to the recovery room in stable condition.  Ruta Hinds, MD [facility] Office: [phone] Pager: [phone]

## 2016-09-08 ENCOUNTER — Encounter (HOSPITAL_COMMUNITY): Payer: Self-pay | Admitting: Vascular Surgery

## 2016-09-14 ENCOUNTER — Ambulatory Visit (INDEPENDENT_AMBULATORY_CARE_PROVIDER_SITE_OTHER): Payer: BLUE CROSS/BLUE SHIELD | Admitting: Nurse Practitioner

## 2016-09-14 ENCOUNTER — Encounter: Payer: Self-pay | Admitting: Nurse Practitioner

## 2016-09-14 VITALS — BP 130/82 | HR 60 | Ht 62.25 in | Wt 186.0 lb

## 2016-09-14 DIAGNOSIS — Z01411 Encounter for gynecological examination (general) (routine) with abnormal findings: Secondary | ICD-10-CM | POA: Diagnosis not present

## 2016-09-14 DIAGNOSIS — N184 Chronic kidney disease, stage 4 (severe): Secondary | ICD-10-CM | POA: Diagnosis not present

## 2016-09-14 DIAGNOSIS — Z0000 Encounter for general adult medical examination without abnormal findings: Secondary | ICD-10-CM

## 2016-09-14 DIAGNOSIS — Q613 Polycystic kidney, unspecified: Secondary | ICD-10-CM | POA: Diagnosis not present

## 2016-09-14 MED ORDER — CYCLOBENZAPRINE HCL 5 MG PO TABS
5.0000 mg | ORAL_TABLET | Freq: Every day | ORAL | 12 refills | Status: DC
Start: 2016-09-14 — End: 2016-09-14

## 2016-09-14 MED ORDER — CYCLOBENZAPRINE HCL 5 MG PO TABS: 5.0000 mg | ORAL_TABLET | Freq: Every day | ORAL | 12 refills | Status: DC

## 2016-09-14 MED ORDER — VENLAFAXINE HCL ER 37.5 MG PO CP24: ORAL_CAPSULE | ORAL | 4 refills | Status: DC

## 2016-09-14 NOTE — Patient Instructions (Signed)

## 2016-09-14 NOTE — Progress Notes (Signed)
Patient ID: Mariah [name], female   DOB: October 11, 1956, 60 y.o.   MRN: [id number]  60 y.o. G0P0000 Married  Caucasian Fe here for annual exam.  Pt has a history of polycystic kidney's.  She just had surgery on 0000000 for basilic vein transposition for preparation of kidney dialysis.  She is followed by Dr. Mercy Moore for CK D4.  She now has a match for kidney transplant and will have this done late January or early February 2018.  She is getting iron infusions.  Overall really overwhelmed but trying to have a positive attitude.  Patient's last menstrual period was 07/11/2009 (approximate).          Sexually active: Yes.    The current method of family planning is none.  Female partner. Exercising: No.  The patient does not participate in regular exercise at present. Smoker:  no  Health Maintenance: Pap: 09/09/15, Negative with neg HR HPV MMG: 10/03/15, Bi-Rads 1: Negative   Colonoscopy: 08/04/15, Tubular adenoma, repeat in 5 years BMD: 01/06/11, T Score: 1.4 Spine / 0.2 Left Femur Total TDaP: 07/27/11 Shingles: 12/26/12 Hep C and HIV: 09/16/15 Labs: PCP and transplant team   reports that she quit smoking about 19 years ago. Her smoking use included Cigarettes. She has a 48.00 pack-year smoking history. She has never used smokeless tobacco. She reports that she drinks about 0.6 oz of alcohol per week . She reports that she does not use drugs.  Past Medical History:  Diagnosis Date  . Anemia   . Arthritis   . Atrial fibrillation ([place]) 2012  . Gout   . History of kidney stones   . Hyperlipidemia   . Hypertension   . Pneumonia   . Polycystic kidney disease   . PONV (postoperative nausea and vomiting)   . SDH (subdural hematoma) ([facility])     Past Surgical History:  Procedure Laterality Date  . BASCILIC VEIN TRANSPOSITION Left 09/07/2016   Procedure: BASILIC VEIN TRANSPOSITION LEFT UPPER ARM;  Surgeon: Elam Dutch, MD;  Location: [facility];  Service: Vascular;  Laterality: Left;  .  COLONOSCOPY  03/2004   repeat 10 years  . Evacuation of subdural hematoma  11/26/2000  . KIDNEY STONE SURGERY    . ORIF ELBOW FRACTURE  10/22/2011   Procedure: OPEN REDUCTION INTERNAL FIXATION (ORIF) ELBOW/OLECRANON FRACTURE;  Surgeon: Schuyler Amor, MD;  Location: [facility];  Service: Orthopedics;  Laterality: Left;  open reduction  possible lateral reconstruction with palmaris graft from left or right side.  [name] Kitchen RADIAL HEAD IMPLANT  09/24/2011   Procedure: RADIAL HEAD IMPLANT;  Surgeon: Schuyler Amor, MD;  Location: [facility];  Service: Orthopedics;  Laterality: Left;  left radial head replacement  . TONSILLECTOMY      Current Outpatient Prescriptions  Medication Sig Dispense Refill  . acetaminophen (TYLENOL) 500 MG tablet Take 1,000 mg by mouth every 6 (six) hours as needed for fever (pain).    [name] Kitchen amLODipine-atorvastatin ([name]) 10-80 MG tablet Take 1 tablet by mouth every evening.    . calcium carbonate (TUMS - DOSED IN MG ELEMENTAL CALCIUM) 500 MG chewable tablet Chew 2 tablets by mouth 3 (three) times daily with meals.    . colchicine (COLCRYS) 0.6 MG tablet Take 1 tablet (0.6 mg total) by mouth See admin instructions. Take 1 tablet (0.6 mg) by mouth every hour for 3 hours when needed for gout attacks, repeat 2nd day, if gout continues on 3rd day consult with doctor 30 tablet  5  . cyclobenzaprine (FLEXERIL) 10 MG tablet Take 5 mg by mouth at bedtime as needed for muscle spasms.     . Darbepoetin Alfa-Albumin (ARANESP IJ) Inject as directed. EVERY 2 WEEKS    . Febuxostat (ULORIC) 80 MG TABS Take 80 mg by mouth daily with lunch.    . furosemide (LASIX) 40 MG tablet Take 40-80 mg by mouth 2 (two) times daily. 40MG  IN THE am AND 80MG  IN THE PM    . HYDROmorphone (DILAUDID) 2 MG tablet Take 1 tablet (2 mg total) by mouth every 4 (four) hours as needed for severe pain. Reported on 11/21/2015 30 tablet 0  . loratadine (CLARITIN) 10 MG tablet Take 10 mg by  mouth daily as needed (seasonal allergies). Daily with lunch    . losartan-hydrochlorothiazide (HYZAAR) 50-12.5 MG tablet Take 1 tablet by mouth 3 (three) times daily.    . magnesium oxide (MAG-OX) 400 MG tablet Take 1 tablet (400 mg total) by mouth at bedtime as needed (cramping). 30 tablet 3  . metoprolol succinate (TOPROL-XL) 100 MG 24 hr tablet Take 100 mg by mouth at bedtime.  1  . Multiple Vitamin (MULTIVITAMIN WITH MINERALS) TABS tablet Take 1 tablet by mouth daily with lunch. Centrum Silver for Women    . Probiotic Product (PHILLIPS COLON HEALTH PO) Take 1 capsule by mouth daily as needed.     . promethazine (PHENERGAN) 25 MG tablet Take 1 tablet (25 mg total) by mouth every 6 (six) hours as needed for nausea or vomiting. 30 tablet 1  . SODIUM BICARBONATE PO Take 1 tablet by mouth 3 (three) times daily.     [name] Kitchen venlafaxine XR (EFFEXOR-XR) 37.5 MG 24 hr capsule Take 37.5 mg by mouth daily with breakfast.     No current facility-administered medications for this visit.     Family History  Problem Relation Age of Onset  . Polycystic kidney disease Father   . Heart failure Father   . Heart disease Mother     Rheumatic fever    ROS:  Pertinent items are noted in HPI.  Otherwise, a comprehensive ROS was negative.  Exam:   LMP 07/11/2009 (Approximate)    Ht Readings from Last 3 Encounters:  08/12/16 5' 2.25" (1.581 m)  03/09/16 5' 2.25" (1.581 m)  11/21/15 5' 2.25" (1.581 m)    General appearance: alert, cooperative and appears stated age Head: Normocephalic, without obvious abnormality, atraumatic Neck: no adenopathy, supple, symmetrical, trachea midline and thyroid normal to inspection and palpation Lungs: clear to auscultation bilaterally Breasts: normal appearance, no masses or tenderness Heart: regular rate and rhythm Abdomen: soft, non-tender; no masses,  no organomegaly Extremities: extremities normal, atraumatic, no cyanosis or edema Skin: Skin color, texture, turgor  normal. No rashes or lesions Lymph nodes: Cervical, supraclavicular, and axillary nodes normal. No abnormal inguinal nodes palpated Neurologic: Grossly normal   Pelvic: External genitalia:  no lesions              Urethra:  normal appearing urethra with no masses, tenderness or lesions              Bartholin's and Skene's: normal                 Vagina: normal appearing vagina with normal color and discharge, no lesions              Cervix: anteverted - very limited visualization due to comfort level.  Pap taken: No. Bimanual Exam:  Uterus:  normal size, contour, position, consistency, mobility, non-tender              Adnexa: normal adnexa, also limited due to comfort.               Rectovaginal: Confirms               Anus:  normal sphincter tone, no lesions  Chaperone present: yes  A:  Well Woman with normal exam  Postmenopausal no HRT Fibromyalgia Situational depression and anxiety History of SVT, gout History of polycystic kidney and chronic kidney disease stage IVwith pre transplant preparation - to be done ~ 10/2016             Weight loss of 24 pounds since 2015  - with illness not intentional    P:   Reviewed health and wellness pertinent to exam  Pap smear as above  Mammogram is due 12/17 and will schedule  Refill on Effexor - with recent stressors she may add a second dose if needed prn.  Counseled on breast self exam, mammography screening, adequate intake of calcium and vitamin D, diet and exercise return annually or prn  An After Visit Summary was printed and given to the patient.

## 2016-09-17 NOTE — Progress Notes (Signed)
Encounter reviewed by Dr. Castin Donaghue Amundson C. Silva.  

## 2016-09-22 ENCOUNTER — Encounter: Payer: Self-pay | Admitting: Nurse Practitioner

## 2016-10-02 ENCOUNTER — Other Ambulatory Visit: Payer: Self-pay | Admitting: Physician Assistant

## 2016-10-14 ENCOUNTER — Encounter: Payer: Self-pay | Admitting: Nurse Practitioner

## 2016-10-18 ENCOUNTER — Encounter: Payer: Self-pay | Admitting: Vascular Surgery

## 2016-10-19 ENCOUNTER — Other Ambulatory Visit: Payer: Self-pay | Admitting: Obstetrics & Gynecology

## 2016-10-19 DIAGNOSIS — Z1231 Encounter for screening mammogram for malignant neoplasm of breast: Secondary | ICD-10-CM

## 2016-10-21 ENCOUNTER — Encounter: Payer: BLUE CROSS/BLUE SHIELD | Admitting: Vascular Surgery

## 2016-10-21 ENCOUNTER — Ambulatory Visit (INDEPENDENT_AMBULATORY_CARE_PROVIDER_SITE_OTHER): Payer: Self-pay | Admitting: Vascular Surgery

## 2016-10-21 ENCOUNTER — Ambulatory Visit (HOSPITAL_COMMUNITY)
Admit: 2016-10-21 | Discharge: 2016-10-21 | Disposition: A | Discharge status: Home / Self Care | Payer: BLUE CROSS/BLUE SHIELD | Attending: Vascular Surgery | Admitting: Vascular Surgery

## 2016-10-21 ENCOUNTER — Encounter: Payer: Self-pay | Admitting: Vascular Surgery

## 2016-10-21 VITALS — BP 151/88 | HR 73 | Temp 97.0°F | Resp 16 | Ht 62.0 in | Wt 186.0 lb

## 2016-10-21 DIAGNOSIS — Z4931 Encounter for adequacy testing for hemodialysis: Secondary | ICD-10-CM | POA: Diagnosis not present

## 2016-10-21 DIAGNOSIS — N184 Chronic kidney disease, stage 4 (severe): Secondary | ICD-10-CM

## 2016-10-21 DIAGNOSIS — N186 End stage renal disease: Secondary | ICD-10-CM | POA: Insufficient documentation

## 2016-10-21 NOTE — Progress Notes (Signed)
Patient name: AYRIONNA SHOLTIS MRN: [id number] DOB: [birth year] Sex: female  REASON FOR VISIT: post-op  HPI: IVYLYNN BALSIGER is a 61 y.o. female who presents for postoperative follow-up status post left basilic vein transposition. The patient reports some intermittent numbness and coldness in her left hand at night. She denies any pain in her left hand. States that this is tolerable. She is not yet on hemodialysis. She recently found out that she has a matched for a kidney transplant. She will likely have transplant surgery next month.  Current Outpatient Prescriptions  Medication Sig Dispense Refill  . acetaminophen (TYLENOL) 500 MG tablet Take 1,000 mg by mouth every 6 (six) hours as needed for fever (pain).    [name] Kitchen amLODipine-atorvastatin ([name]) 10-80 MG tablet Take 1 tablet by mouth every evening.    [name] Kitchen amLODipine-atorvastatin ([name]) 10-80 MG tablet Take 1 tablet by mouth daily. NEED FOLLOW UP VISIT FOR MORE REFILLS (Patient not taking: Reported on 10/21/2016) 30 tablet 0  . calcium carbonate (TUMS - DOSED IN MG ELEMENTAL CALCIUM) 500 MG chewable tablet Chew 2 tablets by mouth 3 (three) times daily with meals.    . colchicine (COLCRYS) 0.6 MG tablet Take 1 tablet (0.6 mg total) by mouth See admin instructions. Take 1 tablet (0.6 mg) by mouth every hour for 3 hours when needed for gout attacks, repeat 2nd day, if gout continues on 3rd day consult with doctor 30 tablet 5  . cyclobenzaprine (FLEXERIL) 5 MG tablet Take 1 tablet (5 mg total) by mouth at bedtime. 30 tablet 12  . Darbepoetin Alfa-Albumin (ARANESP IJ) Inject as directed. EVERY 2 WEEKS    . Febuxostat (ULORIC) 80 MG TABS Take 80 mg by mouth daily with lunch.    . furosemide (LASIX) 40 MG tablet Take 40-80 mg by mouth 2 (two) times daily. 40MG  IN THE am AND 80MG  IN THE PM    . HYDROmorphone (DILAUDID) 2 MG tablet Take 1 tablet (2 mg total) by mouth every 4 (four) hours as needed for severe pain. Reported on 11/21/2015 30 tablet 0  .  loratadine (CLARITIN) 10 MG tablet Take 10 mg by mouth daily as needed (seasonal allergies). Daily with lunch    . losartan-hydrochlorothiazide (HYZAAR) 50-12.5 MG tablet Take 1 tablet by mouth 3 (three) times daily.    . magnesium oxide (MAG-OX) 400 MG tablet Take 1 tablet (400 mg total) by mouth at bedtime as needed (cramping). 30 tablet 3  . metoprolol succinate (TOPROL-XL) 100 MG 24 hr tablet Take 100 mg by mouth at bedtime.  1  . Multiple Vitamin (MULTIVITAMIN WITH MINERALS) TABS tablet Take 1 tablet by mouth daily with lunch. Centrum Silver for Women    . Probiotic Product (PHILLIPS COLON HEALTH PO) Take 1 capsule by mouth daily as needed.     . promethazine (PHENERGAN) 25 MG tablet Take 1 tablet (25 mg total) by mouth every 6 (six) hours as needed for nausea or vomiting. 30 tablet 1  . SODIUM BICARBONATE PO Take 1 tablet by mouth 3 (three) times daily.     [name] Kitchen venlafaxine XR (EFFEXOR-XR) 37.5 MG 24 hr capsule 1 tablet 1-2 times a day 180 capsule 4   No current facility-administered medications for this visit.     REVIEW OF SYSTEMS:  [X]  denotes positive finding, [ ]  denotes negative finding Cardiac  Comments:  Chest pain or chest pressure:    Shortness of breath upon exertion:    Short of breath when lying flat:  Irregular heart rhythm:    Constitutional    Fever or chills:      PHYSICAL EXAM: Vitals:   10/21/16 1441  BP: (!) 151/88  Pulse: 73  Resp: 16  Temp: 97 F (36.1 C)  TempSrc: Oral  SpO2: 97%  Weight: 186 lb (84.4 kg)  Height: 5\' 2"  (1.575 m)    GENERAL: The patient is a well-nourished female, in no acute distress. The vital signs are documented above. VASCULAR: Left upper arm incisions well-healed. Easily visible left upper arm fistula with palpable thrill. 2+ left radial pulse.  DATA:  Dialysis fistula duplex 10/21/2016  Vein is 0.54 cm at antecubital fossa, 0.68 cm in the distal upper arm and 0.74 cm at the proximal upper arm. Depth is less then 3  mm.  MEDICAL ISSUES: CKD stage IV Status post left basilic vein transposition 09/07/2016  The patient's left basilic vein fistula is maturing well. This should be ready for use 3 months from the original surgery date if she requires dialysis. She has a Orthoptist for a kidney transplant with plans for transplant surgery next month. She will follow up on an as-needed basis.   Virgina Jock, PA-C [facility] MD: Oneida Alar

## 2016-10-27 ENCOUNTER — Ambulatory Visit: Payer: BLUE CROSS/BLUE SHIELD

## 2016-11-01 ENCOUNTER — Ambulatory Visit (INDEPENDENT_AMBULATORY_CARE_PROVIDER_SITE_OTHER): Payer: BLUE CROSS/BLUE SHIELD | Admitting: Physician Assistant

## 2016-11-01 VITALS — BP 133/60 | HR 83

## 2016-11-01 DIAGNOSIS — Z9229 Personal history of other drug therapy: Secondary | ICD-10-CM

## 2016-11-01 DIAGNOSIS — Z23 Encounter for immunization: Secondary | ICD-10-CM

## 2016-11-01 NOTE — Progress Notes (Signed)
Patient came into clinic today to complete her Hep B immunization series. Pt reports no negative side effects from previous injections. Pt tolerated Hep B injection in right deltoid well, no immediate complications. Pt advised to contact clinic with any questions or concerns. Verbalized understanding, no further questions at this time.

## 2016-11-03 ENCOUNTER — Ambulatory Visit (INDEPENDENT_AMBULATORY_CARE_PROVIDER_SITE_OTHER): Payer: BLUE CROSS/BLUE SHIELD

## 2016-11-03 DIAGNOSIS — Z1231 Encounter for screening mammogram for malignant neoplasm of breast: Secondary | ICD-10-CM | POA: Diagnosis not present

## 2016-11-11 DIAGNOSIS — Q613 Polycystic kidney, unspecified: Secondary | ICD-10-CM

## 2016-11-11 HISTORY — DX: Polycystic kidney, unspecified: Q61.3

## 2016-11-25 ENCOUNTER — Telehealth: Payer: Self-pay | Admitting: Nurse Practitioner

## 2016-11-25 NOTE — Telephone Encounter (Signed)
Patient called wants to let you know transplant went great.  Also on Monday night they are doing a story on [employer] about her and her donor.

## 2016-11-29 DIAGNOSIS — D849 Immunodeficiency, unspecified: Secondary | ICD-10-CM | POA: Insufficient documentation

## 2016-11-29 DIAGNOSIS — D899 Disorder involving the immune mechanism, unspecified: Secondary | ICD-10-CM

## 2016-11-29 DIAGNOSIS — Z940 Kidney transplant status: Secondary | ICD-10-CM | POA: Insufficient documentation

## 2016-12-16 DIAGNOSIS — I701 Atherosclerosis of renal artery: Secondary | ICD-10-CM | POA: Insufficient documentation

## 2016-12-16 DIAGNOSIS — T8619 Other complication of kidney transplant: Secondary | ICD-10-CM

## 2017-01-27 ENCOUNTER — Encounter: Payer: Self-pay | Admitting: Physician Assistant

## 2017-01-27 DIAGNOSIS — I480 Paroxysmal atrial fibrillation: Secondary | ICD-10-CM | POA: Insufficient documentation

## 2017-01-31 ENCOUNTER — Telehealth: Payer: Self-pay

## 2017-01-31 NOTE — Telephone Encounter (Signed)
Phone call from pt.  Reported she continues to have symptoms with left hand / fingers going to sleep at night. Also reported that her fingers swell at night.  Reported   she is having the symptoms intermittently during the day also, she feels that it is getting worse.  Stated she is able to grip glass or mug with left hand, but has difficulty lifting a half gallon of milk, and putting in refrigerator.  Otherwise, stated she has good mobility with her left hand.   Reported not on HD, as she rec'd kidney transplant in February.  Stated the AVF has never been used.  Advised will discuss with Dr. Oneida Alar re: need for vascular study,and return call with appt. information.  Verb. understanding.

## 2017-02-01 ENCOUNTER — Encounter: Payer: Self-pay | Admitting: Nurse Practitioner

## 2017-02-01 NOTE — Telephone Encounter (Signed)
RE: question re: Vasc. Study  Received: Today  Message Contents  Elam Dutch, MD  Denman George, RN        Does not need steal study just needs appt

## 2017-02-21 ENCOUNTER — Encounter: Payer: Self-pay | Admitting: Vascular Surgery

## 2017-02-24 ENCOUNTER — Encounter: Payer: Self-pay | Admitting: Vascular Surgery

## 2017-02-24 ENCOUNTER — Other Ambulatory Visit: Payer: Self-pay | Admitting: *Deleted

## 2017-02-24 ENCOUNTER — Ambulatory Visit (INDEPENDENT_AMBULATORY_CARE_PROVIDER_SITE_OTHER): Payer: BLUE CROSS/BLUE SHIELD | Admitting: Vascular Surgery

## 2017-02-24 ENCOUNTER — Encounter: Payer: Self-pay | Admitting: *Deleted

## 2017-02-24 VITALS — BP 127/88 | HR 83 | Temp 98.3°F | Resp 16 | Ht 62.0 in | Wt 182.0 lb

## 2017-02-24 DIAGNOSIS — T82898D Other specified complication of vascular prosthetic devices, implants and grafts, subsequent encounter: Secondary | ICD-10-CM | POA: Diagnosis not present

## 2017-02-24 NOTE — Progress Notes (Signed)
HISTORY AND PHYSICAL     CC:  Fistula problems Referring Provider:  Donella Stade, PA-C  HPI: This is a 61 y.o. female who underwent left basilic vein transposition in November 2017.  She was seen a couple weeks later and had complaints of intermittent numbness and coldness of her left hand at night and it was tolerable.    She returns today with similar complaints, but that it is worsening.  She cannot get relief at night no matter how she is positioned.   She did undergo a kidney transplant in February and it is working well.      Past Medical History:  Diagnosis Date  . Anemia   . Arthritis   . Atrial fibrillation ([place]) 2012  . Gout   . History of kidney stones   . Hyperlipidemia   . Hypertension   . Pneumonia   . Polycystic kidney disease   . PONV (postoperative nausea and vomiting)   . SDH (subdural hematoma) ([facility])     Past Surgical History:  Procedure Laterality Date  . BASCILIC VEIN TRANSPOSITION Left 09/07/2016   Procedure: BASILIC VEIN TRANSPOSITION LEFT UPPER ARM;  Surgeon: Elam Dutch, MD;  Location: [place] [place];  Service: Vascular;  Laterality: Left;  . COLONOSCOPY  03/2004   repeat 10 years  . Evacuation of subdural hematoma  11/26/2000  . KIDNEY STONE SURGERY    . ORIF ELBOW FRACTURE  10/22/2011   Procedure: OPEN REDUCTION INTERNAL FIXATION (ORIF) ELBOW/OLECRANON FRACTURE;  Surgeon: Schuyler Amor, MD;  Location: [place];  Service: Orthopedics;  Laterality: Left;  open reduction  possible lateral reconstruction with palmaris graft from left or right side.  [name] Kitchen RADIAL HEAD IMPLANT  09/24/2011   Procedure: RADIAL HEAD IMPLANT;  Surgeon: Schuyler Amor, MD;  Location: [place];  Service: Orthopedics;  Laterality: Left;  left radial head replacement  . TONSILLECTOMY      Allergies  Allergen Reactions  . Codeine Itching  . Morphine Itching  . Zovirax [Acyclovir Sodium] Itching    Current Outpatient Prescriptions    Medication Sig Dispense Refill  . acetaminophen (TYLENOL) 500 MG tablet Take 1,000 mg by mouth every 6 (six) hours as needed for fever (pain).    [name] Kitchen amLODipine (NORVASC) 10 MG tablet Take 10 mg by mouth daily.    [name] Kitchen aspirin EC 81 MG tablet Take 81 mg by mouth daily.    [name] Kitchen atorvastatin (LIPITOR) 40 MG tablet Take 40 mg by mouth daily.    . cloNIDine (CATAPRES) 0.1 MG tablet Take 0.1 mg by mouth 2 (two) times daily.    [name] Kitchen loratadine (CLARITIN) 10 MG tablet Take 10 mg by mouth daily as needed (seasonal allergies). Daily with lunch    . Magnesium 400 MG TABS Take by mouth.    . magnesium oxide (MAG-OX) 400 MG tablet Take 1 tablet (400 mg total) by mouth at bedtime as needed (cramping). 30 tablet 3  . metFORMIN (GLUCOPHAGE) 500 MG tablet Take by mouth 2 (two) times daily with a meal.    . metoprolol succinate (TOPROL-XL) 100 MG 24 hr tablet Take 100 mg by mouth at bedtime.  1  . metoprolol tartrate (LOPRESSOR) 100 MG tablet Take 100 mg by mouth 2 (two) times daily.    . Multiple Vitamin (MULTIVITAMIN WITH MINERALS) TABS tablet Take 1 tablet by mouth daily with lunch. Centrum Silver for Women    . Multiple Vitamins-Minerals (CENTRUM ADULTS PO) Take by mouth.    [name] Kitchen  mycophenolate (MYFORTIC) 180 MG EC tablet Take 180 mg by mouth 2 (two) times daily.    [name] Kitchen sulfamethoxazole-trimethoprim (BACTRIM,SEPTRA) 400-80 MG tablet Take 1 tablet by mouth 2 (two) times daily.    . tacrolimus (PROGRAF) 1 MG capsule Take 1 mg by mouth 2 (two) times daily.    . valGANciclovir (VALCYTE) 450 MG tablet Take by mouth daily.    [name] Kitchen venlafaxine XR (EFFEXOR-XR) 37.5 MG 24 hr capsule 1 tablet 1-2 times a day 180 capsule 4  . cyclobenzaprine (FLEXERIL) 5 MG tablet Take 1 tablet (5 mg total) by mouth at bedtime. (Patient not taking: Reported on 02/24/2017) 30 tablet 12  . HYDROmorphone (DILAUDID) 2 MG tablet Take 1 tablet (2 mg total) by mouth every 4 (four) hours as needed for severe pain. Reported on 11/21/2015 (Patient not taking:  Reported on 02/24/2017) 30 tablet 0  . promethazine (PHENERGAN) 25 MG tablet Take 1 tablet (25 mg total) by mouth every 6 (six) hours as needed for nausea or vomiting. (Patient not taking: Reported on 02/24/2017) 30 tablet 1   No current facility-administered medications for this visit.     Family History  Problem Relation Age of Onset  . Polycystic kidney disease Father   . Heart failure Father   . Heart disease Mother        Rheumatic fever    Social History   Social History  . Marital status: Married    Spouse name: N/A  . Number of children: 0  . 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 Smoking status: Former Smoker    Packs/day: 2.00    Years: 24.00    Types: Cigarettes    Quit date: 12/12/1996  . Smokeless tobacco: Never Used  . Alcohol use 0.6 oz/week    1 Glasses of wine per week  . Drug use: No  . Sexual activity: Yes    Partners: Female   Other Topics Concern  . Not on file   Social History Narrative  . No narrative on file     ROS: [x]  Positive   [ ]  Negative   [ ]  All sytems reviewed and are negative  Cardiovascular: []  chest pain/pressure []  palpitations []  SOB lying flat [x]  irregular heart rhythm - afib []  DOE []  pain in legs while walking []  pain in feet when lying flat []  hx of DVT [x]  swelling of left hanx  Pulmonary: []  productive cough []  asthma []  wheezing  Neurologic: []  weakness in []  arms []  legs []  numbness in []  arms []  legs [] difficulty speaking or slurred speech []  temporary loss of vision in one eye []  dizziness  Hematologic: []  bleeding problems []  problems with blood clotting easily  GI []  vomiting blood []  blood in stool  GU: []  CKD/renal failure  []  HD---[]  M/W/F []  T/T/F []  burning with urination []  blood in urine  Psychiatric: []  hx of major depression  Integumentary: []  rashes []  ulcers  Constitutional: []  fever []   chills  PHYSICAL EXAMINATION:  Vitals:   02/24/17 1512  BP: 127/88  Pulse: 83  Resp: 16  Temp: 98.3 F (36.8 C)   Body mass index is 33.29 kg/m.  General:  WDWN in NAD Gait: Normal HENT: WNL Pulmonary: normal non-labored breathing , without Rales, rhonchi,  wheezing Cardiac: regular, without  Murmurs, rubs or gallops; without carotid bruits Abdomen: soft, NT, no masses Skin: without rashes, without ulcers  Vascular Exam/Pulses:  Right Left  Radial 2+ (normal) 1+ (weak)  Ulnar 2+ (normal) Unable to palpate    Extremities:  without ischemic changes, without Gangrene, without cellulitis; without open wounds;  Musculoskeletal: no muscle wasting or atrophy  Neurologic: A&O X 3; Moving all extremities equally;  Speech is fluent/normal   Non-Invasive Vascular Imaging:   none   ASSESSMENT/PLAN: 61 y.o. female with hx of left basilic vein transposition in November 2017 with continued numbness of her 2nd and 3rd fingers   - pt received a kidney transplant in February 2018 and it is working well.  Pt would like to have fistula removed to improve the numbness in her fingers as it interfering with her lifestyle/sleep. -will plan for ligation of left basilic fistula on 11/19/11 as Dr. Oneida Alar 2nd case.   Leontine Locket, PA-C [facility] [phone]  Clinic MD:   Pt seen and examined with Dr. Oneida Alar  Patient has moderate to severe steal symptoms left arm from previous AV fistula placement. She has a functioning kidney transplant. I believe the best option at this point would be ligation of her fistula to improve her symptoms. This is scheduled for 03/09/2017. Risks benefits possible palpitations and procedure details including but not limited to bleeding and infection were discussed the patient presents she understands and agrees to proceed.  Ruta Hinds, MD [facility] [place] Office: [phone] Pager: [phone]

## 2017-03-08 ENCOUNTER — Encounter (HOSPITAL_COMMUNITY): Payer: Self-pay | Admitting: *Deleted

## 2017-03-08 NOTE — Progress Notes (Signed)
[name] denies chest pain or shortness of breath.  Patient reports that she does not have diabetes, takes Metformin because of anti rejection medication.  Patient does not have a CBG machine, she has labs drawn every 2 weeks.

## 2017-03-09 ENCOUNTER — Ambulatory Visit (HOSPITAL_COMMUNITY): Payer: BLUE CROSS/BLUE SHIELD | Admitting: Anesthesiology

## 2017-03-09 ENCOUNTER — Encounter (HOSPITAL_COMMUNITY)
Admission: RE | Disposition: A | Discharge status: Home / Self Care | Payer: Self-pay | Source: Ambulatory Visit | Attending: Vascular Surgery

## 2017-03-09 ENCOUNTER — Ambulatory Visit (HOSPITAL_COMMUNITY)
Admission: RE | Admit: 2017-03-09 | Discharge: 2017-03-09 | Disposition: A | Discharge status: Home / Self Care | Payer: BLUE CROSS/BLUE SHIELD | Source: Ambulatory Visit | Attending: Vascular Surgery | Admitting: Vascular Surgery

## 2017-03-09 ENCOUNTER — Telehealth: Payer: Self-pay | Admitting: Vascular Surgery

## 2017-03-09 ENCOUNTER — Encounter (HOSPITAL_COMMUNITY): Payer: Self-pay | Admitting: Urology

## 2017-03-09 DIAGNOSIS — I129 Hypertensive chronic kidney disease with stage 1 through stage 4 chronic kidney disease, or unspecified chronic kidney disease: Secondary | ICD-10-CM | POA: Diagnosis not present

## 2017-03-09 DIAGNOSIS — F329 Major depressive disorder, single episode, unspecified: Secondary | ICD-10-CM | POA: Diagnosis not present

## 2017-03-09 DIAGNOSIS — Z792 Long term (current) use of antibiotics: Secondary | ICD-10-CM | POA: Insufficient documentation

## 2017-03-09 DIAGNOSIS — Z940 Kidney transplant status: Secondary | ICD-10-CM | POA: Diagnosis not present

## 2017-03-09 DIAGNOSIS — Z7984 Long term (current) use of oral hypoglycemic drugs: Secondary | ICD-10-CM | POA: Diagnosis not present

## 2017-03-09 DIAGNOSIS — Z79899 Other long term (current) drug therapy: Secondary | ICD-10-CM | POA: Insufficient documentation

## 2017-03-09 DIAGNOSIS — I4891 Unspecified atrial fibrillation: Secondary | ICD-10-CM | POA: Insufficient documentation

## 2017-03-09 DIAGNOSIS — E785 Hyperlipidemia, unspecified: Secondary | ICD-10-CM | POA: Diagnosis not present

## 2017-03-09 DIAGNOSIS — Z7982 Long term (current) use of aspirin: Secondary | ICD-10-CM | POA: Diagnosis not present

## 2017-03-09 DIAGNOSIS — N189 Chronic kidney disease, unspecified: Secondary | ICD-10-CM | POA: Diagnosis not present

## 2017-03-09 DIAGNOSIS — T82898A Other specified complication of vascular prosthetic devices, implants and grafts, initial encounter: Secondary | ICD-10-CM | POA: Insufficient documentation

## 2017-03-09 DIAGNOSIS — Z87891 Personal history of nicotine dependence: Secondary | ICD-10-CM | POA: Insufficient documentation

## 2017-03-09 DIAGNOSIS — Z86718 Personal history of other venous thrombosis and embolism: Secondary | ICD-10-CM | POA: Insufficient documentation

## 2017-03-09 DIAGNOSIS — Y832 Surgical operation with anastomosis, bypass or graft as the cause of abnormal reaction of the patient, or of later complication, without mention of misadventure at the time of the procedure: Secondary | ICD-10-CM | POA: Insufficient documentation

## 2017-03-09 HISTORY — DX: Malignant (primary) neoplasm, unspecified: C80.1

## 2017-03-09 HISTORY — PX: LIGATION OF ARTERIOVENOUS  FISTULA: SHX5948

## 2017-03-09 HISTORY — DX: Prediabetes: R73.03

## 2017-03-09 LAB — GLUCOSE, CAPILLARY: Glucose-Capillary: 103 mg/dL — ABNORMAL HIGH (ref 65–99)

## 2017-03-09 SURGERY — LIGATION OF ARTERIOVENOUS  FISTULA
Anesthesia: General | Site: Arm Lower | Laterality: Left

## 2017-03-09 MED ORDER — DEXTROSE 5 % IV SOLN
INTRAVENOUS | Status: AC
  Filled 2017-03-09: qty 1.5

## 2017-03-09 MED ORDER — ONDANSETRON HCL 4 MG/2ML IJ SOLN
INTRAMUSCULAR | Status: DC | PRN
  Administered 2017-03-09: 4 mg via INTRAVENOUS

## 2017-03-09 MED ORDER — PROPOFOL 1000 MG/100ML IV EMUL
INTRAVENOUS | Status: AC
  Filled 2017-03-09: qty 100

## 2017-03-09 MED ORDER — SODIUM CHLORIDE 0.9 % IV SOLN: INTRAVENOUS | Status: DC

## 2017-03-09 MED ORDER — FENTANYL CITRATE (PF) 100 MCG/2ML IJ SOLN
25.0000 ug | INTRAMUSCULAR | Status: DC | PRN
  Administered 2017-03-09: 100 ug via INTRAVENOUS

## 2017-03-09 MED ORDER — ONDANSETRON HCL 4 MG/2ML IJ SOLN
INTRAMUSCULAR | Status: AC
  Filled 2017-03-09: qty 2

## 2017-03-09 MED ORDER — PROPOFOL 10 MG/ML IV BOLUS
INTRAVENOUS | Status: AC
  Filled 2017-03-09: qty 20

## 2017-03-09 MED ORDER — MIDAZOLAM HCL 5 MG/5ML IJ SOLN
INTRAMUSCULAR | Status: DC | PRN
  Administered 2017-03-09: 2 mg via INTRAVENOUS

## 2017-03-09 MED ORDER — LIDOCAINE HCL (PF) 1 % IJ SOLN
INTRAMUSCULAR | Status: DC | PRN
  Administered 2017-03-09: 1 mL

## 2017-03-09 MED ORDER — OXYCODONE HCL 5 MG/5ML PO SOLN: 5.0000 mg | Freq: Once | ORAL | Status: DC | PRN

## 2017-03-09 MED ORDER — MIDAZOLAM HCL 2 MG/2ML IJ SOLN
INTRAMUSCULAR | Status: AC
  Filled 2017-03-09: qty 2

## 2017-03-09 MED ORDER — CEFUROXIME SODIUM 1.5 G IV SOLR
1.5000 g | INTRAVENOUS | Status: AC
  Administered 2017-03-09: 1.5 g via INTRAVENOUS

## 2017-03-09 MED ORDER — LIDOCAINE 2% (20 MG/ML) 5 ML SYRINGE
INTRAMUSCULAR | Status: DC | PRN
  Administered 2017-03-09: 60 mg via INTRAVENOUS

## 2017-03-09 MED ORDER — FENTANYL CITRATE (PF) 100 MCG/2ML IJ SOLN
INTRAMUSCULAR | Status: DC | PRN
  Administered 2017-03-09: 25 ug via INTRAVENOUS
  Administered 2017-03-09 (×2): 50 ug via INTRAVENOUS
  Administered 2017-03-09: 25 ug via INTRAVENOUS

## 2017-03-09 MED ORDER — 0.9 % SODIUM CHLORIDE (POUR BTL) OPTIME
TOPICAL | Status: DC | PRN
  Administered 2017-03-09: 1000 mL

## 2017-03-09 MED ORDER — HEPARIN SODIUM (PORCINE) 1000 UNIT/ML IJ SOLN
INTRAMUSCULAR | Status: AC
  Filled 2017-03-09: qty 1

## 2017-03-09 MED ORDER — LACTATED RINGERS IV SOLN
INTRAVENOUS | Status: DC
Start: 2017-03-09 — End: 2017-03-09
  Administered 2017-03-09: 13:00:00 via INTRAVENOUS

## 2017-03-09 MED ORDER — OXYCODONE HCL 5 MG PO TABS
5.0000 mg | ORAL_TABLET | Freq: Once | ORAL | Status: DC | PRN
Start: 2017-03-09 — End: 2017-03-09

## 2017-03-09 MED ORDER — FENTANYL CITRATE (PF) 100 MCG/2ML IJ SOLN
INTRAMUSCULAR | Status: AC
  Administered 2017-03-09: 100 ug via INTRAVENOUS
  Filled 2017-03-09: qty 2

## 2017-03-09 MED ORDER — ONDANSETRON HCL 4 MG/2ML IJ SOLN: 4.0000 mg | Freq: Four times a day (QID) | INTRAMUSCULAR | Status: DC | PRN

## 2017-03-09 MED ORDER — FENTANYL CITRATE (PF) 250 MCG/5ML IJ SOLN
INTRAMUSCULAR | Status: AC
  Filled 2017-03-09: qty 5

## 2017-03-09 MED ORDER — PROPOFOL 10 MG/ML IV BOLUS
INTRAVENOUS | Status: DC | PRN
  Administered 2017-03-09: 200 mg via INTRAVENOUS

## 2017-03-09 MED ORDER — OXYCODONE-ACETAMINOPHEN 5-325 MG PO TABS: 1.0000 | ORAL_TABLET | Freq: Four times a day (QID) | ORAL | 0 refills | Status: DC | PRN

## 2017-03-09 MED ORDER — LIDOCAINE HCL 1 % IJ SOLN
INTRAMUSCULAR | Status: AC
  Filled 2017-03-09: qty 20

## 2017-03-09 SURGICAL SUPPLY — 28 items
ADH SKN CLS APL DERMABOND .7 (GAUZE/BANDAGES/DRESSINGS) ×1
AGENT HMST SPONGE THK3/8 (HEMOSTASIS)
BANDAGE ACE 4X5 VEL STRL LF (GAUZE/BANDAGES/DRESSINGS) ×2 IMPLANT
CANISTER SUCT 3000ML PPV (MISCELLANEOUS) ×2 IMPLANT
DERMABOND ADVANCED (GAUZE/BANDAGES/DRESSINGS) ×1
DERMABOND ADVANCED .7 DNX12 (GAUZE/BANDAGES/DRESSINGS) ×1 IMPLANT
ELECT REM PT RETURN 9FT ADLT (ELECTROSURGICAL) ×2
ELECTRODE REM PT RTRN 9FT ADLT (ELECTROSURGICAL) ×1 IMPLANT
GLOVE BIO SURGEON STRL SZ 6.5 (GLOVE) ×2 IMPLANT
GLOVE BIO SURGEON STRL SZ7.5 (GLOVE) ×2 IMPLANT
GLOVE BIOGEL PI IND STRL 6.5 (GLOVE) IMPLANT
GLOVE BIOGEL PI INDICATOR 6.5 (GLOVE) ×5
GOWN STRL REUS W/ TWL LRG LVL3 (GOWN DISPOSABLE) ×3 IMPLANT
GOWN STRL REUS W/TWL LRG LVL3 (GOWN DISPOSABLE) ×6
HEMOSTAT SPONGE AVITENE ULTRA (HEMOSTASIS) IMPLANT
KIT BASIN OR (CUSTOM PROCEDURE TRAY) ×2 IMPLANT
KIT ROOM TURNOVER OR (KITS) ×2 IMPLANT
LOOP VESSEL MINI RED (MISCELLANEOUS) IMPLANT
NS IRRIG 1000ML POUR BTL (IV SOLUTION) ×2 IMPLANT
PACK CV ACCESS (CUSTOM PROCEDURE TRAY) ×2 IMPLANT
PAD ARMBOARD 7.5X6 YLW CONV (MISCELLANEOUS) ×4 IMPLANT
SUT PROLENE 6 0 CC (SUTURE) IMPLANT
SUT SILK 0 (SUTURE) IMPLANT
SUT VIC AB 3-0 SH 27 (SUTURE) ×2
SUT VIC AB 3-0 SH 27X BRD (SUTURE) ×1 IMPLANT
SUT VICRYL 4-0 PS2 18IN ABS (SUTURE) ×2 IMPLANT
UNDERPAD 30X30 (UNDERPADS AND DIAPERS) ×2 IMPLANT
WATER STERILE IRR 1000ML POUR (IV SOLUTION) ×1 IMPLANT

## 2017-03-09 NOTE — Anesthesia Preprocedure Evaluation (Signed)
Anesthesia Evaluation  Patient identified by MRN, date of birth, ID band Patient awake    Reviewed: Allergy & Precautions, H&P , NPO status , Patient's Chart, lab work & pertinent test results  History of Anesthesia Complications (+) PONV and history of anesthetic complications  Airway Mallampati: II   Neck ROM: full    Dental   Pulmonary former smoker,    breath sounds clear to auscultation       Cardiovascular hypertension, + dysrhythmias Atrial Fibrillation  Rhythm:regular Rate:Normal     Neuro/Psych PSYCHIATRIC DISORDERS Anxiety    GI/Hepatic   Endo/Other    Renal/GU Renal diseaseH/o renal failure.  Now s/p kidney transplant     Musculoskeletal  (+) Arthritis ,   Abdominal   Peds  Hematology   Anesthesia Other Findings   Reproductive/Obstetrics                             Anesthesia Physical Anesthesia Plan  ASA: III  Anesthesia Plan: General   Post-op Pain Management:    Induction: Intravenous  Airway Management Planned: LMA  Additional Equipment:   Intra-op Plan:   Post-operative Plan:   Informed Consent: I have reviewed the patients History and Physical, chart, labs and discussed the procedure including the risks, benefits and alternatives for the proposed anesthesia with the patient or authorized representative who has indicated his/her understanding and acceptance.     Plan Discussed with: CRNA, Anesthesiologist and Surgeon  Anesthesia Plan Comments:         Anesthesia Quick Evaluation

## 2017-03-09 NOTE — Telephone Encounter (Signed)
-----   Message from Mena Goes, RN sent at 03/09/2017  2:31 PM EDT ----- Regarding: 2-4 weeks   ----- Message ----- From: Elam Dutch, MD Sent: 03/09/2017   2:22 PM To: Vvs Charge Pool  Ligation left arm av fistula No asst  Pt needs follow up in 2-4 weeks  Ruta Hinds

## 2017-03-09 NOTE — H&P (View-Only) (Signed)
HISTORY AND PHYSICAL     CC:  Fistula problems Referring Provider:  Donella Stade, PA-C  HPI: This is a 61 y.o. female who underwent left basilic vein transposition in November 2017.  She was seen a couple weeks later and had complaints of intermittent numbness and coldness of her left hand at night and it was tolerable.    She returns today with similar complaints, but that it is worsening.  She cannot get relief at night no matter how she is positioned.   She did undergo a kidney transplant in February and it is working well.      Past Medical History:  Diagnosis Date  . Anemia   . Arthritis   . Atrial fibrillation ([place]) 2012  . Gout   . History of kidney stones   . Hyperlipidemia   . Hypertension   . Pneumonia   . Polycystic kidney disease   . PONV (postoperative nausea and vomiting)   . SDH (subdural hematoma) ([facility])     Past Surgical History:  Procedure Laterality Date  . BASCILIC VEIN TRANSPOSITION Left 09/07/2016   Procedure: BASILIC VEIN TRANSPOSITION LEFT UPPER ARM;  Surgeon: Elam Dutch, MD;  Location: [place];  Service: Vascular;  Laterality: Left;  . COLONOSCOPY  03/2004   repeat 10 years  . Evacuation of subdural hematoma  11/26/2000  . KIDNEY STONE SURGERY    . ORIF ELBOW FRACTURE  10/22/2011   Procedure: OPEN REDUCTION INTERNAL FIXATION (ORIF) ELBOW/OLECRANON FRACTURE;  Surgeon: Schuyler Amor, MD;  Location: [place];  Service: Orthopedics;  Laterality: Left;  open reduction  possible lateral reconstruction with palmaris graft from left or right side.  [name] Kitchen RADIAL HEAD IMPLANT  09/24/2011   Procedure: RADIAL HEAD IMPLANT;  Surgeon: Schuyler Amor, MD;  Location: [place];  Service: Orthopedics;  Laterality: Left;  left radial head replacement  . TONSILLECTOMY      Allergies  Allergen Reactions  . Codeine Itching  . Morphine Itching  . Zovirax [Acyclovir Sodium] Itching    Current Outpatient Prescriptions    Medication Sig Dispense Refill  . acetaminophen (TYLENOL) 500 MG tablet Take 1,000 mg by mouth every 6 (six) hours as needed for fever (pain).    [name] Kitchen amLODipine (NORVASC) 10 MG tablet Take 10 mg by mouth daily.    [name] Kitchen aspirin EC 81 MG tablet Take 81 mg by mouth daily.    [name] Kitchen atorvastatin (LIPITOR) 40 MG tablet Take 40 mg by mouth daily.    . cloNIDine (CATAPRES) 0.1 MG tablet Take 0.1 mg by mouth 2 (two) times daily.    [name] Kitchen loratadine (CLARITIN) 10 MG tablet Take 10 mg by mouth daily as needed (seasonal allergies). Daily with lunch    . Magnesium 400 MG TABS Take by mouth.    . magnesium oxide (MAG-OX) 400 MG tablet Take 1 tablet (400 mg total) by mouth at bedtime as needed (cramping). 30 tablet 3  . metFORMIN (GLUCOPHAGE) 500 MG tablet Take by mouth 2 (two) times daily with a meal.    . metoprolol succinate (TOPROL-XL) 100 MG 24 hr tablet Take 100 mg by mouth at bedtime.  1  . metoprolol tartrate (LOPRESSOR) 100 MG tablet Take 100 mg by mouth 2 (two) times daily.    . Multiple Vitamin (MULTIVITAMIN WITH MINERALS) TABS tablet Take 1 tablet by mouth daily with lunch. Centrum Silver for Women    . Multiple Vitamins-Minerals (CENTRUM ADULTS PO) Take by mouth.    [name] Kitchen  mycophenolate (MYFORTIC) 180 MG EC tablet Take 180 mg by mouth 2 (two) times daily.    [name] Kitchen sulfamethoxazole-trimethoprim (BACTRIM,SEPTRA) 400-80 MG tablet Take 1 tablet by mouth 2 (two) times daily.    . tacrolimus (PROGRAF) 1 MG capsule Take 1 mg by mouth 2 (two) times daily.    . valGANciclovir (VALCYTE) 450 MG tablet Take by mouth daily.    [name] Kitchen venlafaxine XR (EFFEXOR-XR) 37.5 MG 24 hr capsule 1 tablet 1-2 times a day 180 capsule 4  . cyclobenzaprine (FLEXERIL) 5 MG tablet Take 1 tablet (5 mg total) by mouth at bedtime. (Patient not taking: Reported on 02/24/2017) 30 tablet 12  . HYDROmorphone (DILAUDID) 2 MG tablet Take 1 tablet (2 mg total) by mouth every 4 (four) hours as needed for severe pain. Reported on 11/21/2015 (Patient not taking:  Reported on 02/24/2017) 30 tablet 0  . promethazine (PHENERGAN) 25 MG tablet Take 1 tablet (25 mg total) by mouth every 6 (six) hours as needed for nausea or vomiting. (Patient not taking: Reported on 02/24/2017) 30 tablet 1   No current facility-administered medications for this visit.     Family History  Problem Relation Age of Onset  . Polycystic kidney disease Father   . Heart failure Father   . Heart disease Mother        Rheumatic fever    Social History   Social History  . Marital status: Married    Spouse name: N/A  . Number of children: 0  . 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 Smoking status: Former Smoker    Packs/day: 2.00    Years: 24.00    Types: Cigarettes    Quit date: 12/12/1996  . Smokeless tobacco: Never Used  . Alcohol use 0.6 oz/week    1 Glasses of wine per week  . Drug use: No  . Sexual activity: Yes    Partners: Female   Other Topics Concern  . Not on file   Social History Narrative  . No narrative on file     ROS: [x]  Positive   [ ]  Negative   [ ]  All sytems reviewed and are negative  Cardiovascular: []  chest pain/pressure []  palpitations []  SOB lying flat [x]  irregular heart rhythm - afib []  DOE []  pain in legs while walking []  pain in feet when lying flat []  hx of DVT [x]  swelling of left hanx  Pulmonary: []  productive cough []  asthma []  wheezing  Neurologic: []  weakness in []  arms []  legs []  numbness in []  arms []  legs [] difficulty speaking or slurred speech []  temporary loss of vision in one eye []  dizziness  Hematologic: []  bleeding problems []  problems with blood clotting easily  GI []  vomiting blood []  blood in stool  GU: []  CKD/renal failure  []  HD---[]  M/W/F []  T/T/F []  burning with urination []  blood in urine  Psychiatric: []  hx of major depression  Integumentary: []  rashes []  ulcers  Constitutional: []  fever []   chills  PHYSICAL EXAMINATION:  Vitals:   02/24/17 1512  BP: 127/88  Pulse: 83  Resp: 16  Temp: 98.3 F (36.8 C)   Body mass index is 33.29 kg/m.  General:  WDWN in NAD Gait: Normal HENT: WNL Pulmonary: normal non-labored breathing , without Rales, rhonchi,  wheezing Cardiac: regular, without  Murmurs, rubs or gallops; without carotid bruits Abdomen: soft, NT, no masses Skin: without rashes, without ulcers  Vascular Exam/Pulses:  Right Left  Radial 2+ (normal) 1+ (weak)  Ulnar 2+ (normal) Unable to palpate    Extremities:  without ischemic changes, without Gangrene, without cellulitis; without open wounds;  Musculoskeletal: no muscle wasting or atrophy  Neurologic: A&O X 3; Moving all extremities equally;  Speech is fluent/normal   Non-Invasive Vascular Imaging:   none   ASSESSMENT/PLAN: 61 y.o. female with hx of left basilic vein transposition in November 2017 with continued numbness of her 2nd and 3rd fingers   - pt received a kidney transplant in February 2018 and it is working well.  Pt would like to have fistula removed to improve the numbness in her fingers as it interfering with her lifestyle/sleep. -will plan for ligation of left basilic fistula on 1/61/09 as Dr. Oneida Alar 2nd case.   Leontine Locket, PA-C [facility] [phone]  Clinic MD:   Pt seen and examined with Dr. Oneida Alar  Patient has moderate to severe steal symptoms left arm from previous AV fistula placement. She has a functioning kidney transplant. I believe the best option at this point would be ligation of her fistula to improve her symptoms. This is scheduled for 03/09/2017. Risks benefits possible palpitations and procedure details including but not limited to bleeding and infection were discussed the patient presents she understands and agrees to proceed.  Ruta Hinds, MD [facility] [place] Office: [phone] Pager: [phone]

## 2017-03-09 NOTE — Interval H&P Note (Signed)
History and Physical Interval Note:  03/09/2017 12:53 PM  Mariah [name]  has presented today for surgery, with the diagnosis of Complication of fistula  The various methods of treatment have been discussed with the patient and family. After consideration of risks, benefits and other options for treatment, the patient has consented to  Procedure(s): LIGATION OF LEFT BASILIC VEIN TRANSPOSITION (Left) as a surgical intervention .  The patient's history has been reviewed, patient examined, no change in status, stable for surgery.  I have reviewed the patient's chart and labs.  Questions were answered to the patient's satisfaction.     Ruta Hinds

## 2017-03-09 NOTE — Op Note (Signed)
Procedure: Ligation of left basilic vein transposition AV fistula  Preoperative diagnosis: Ischemic steal left arm AV fistula  Postoperative diagnosis: Same  Anesthesia: Gen.  Operative findings: #1 fistula ligated with 2 0 silk tie doubly just adjacent to anastomosis  Operative details: After obtaining informed consent, the patient was taken to the operating room. The patient was placed in supine position operating table. After induction of general anesthesia and placement of a laryngeal mask the patient's entire left upper extremity was prepped and draped in usual sterile fashion. A longitudinal incision was made through a pre-existing scar in the left antecubital region. The incision was carried into the subcutaneous tissues down to level of a pre-existing left basilic vein transposition AV fistula. The fistula was dissected free circumferentially all the way down to the level of the brachial artery anastomosis.  The fistula was ligated distally with two separate 2 0 silk ties. Hemostasis was obtained. The subcutaneous tissues of the antecubital incision was then reapproximated using a running 3-0 Vicryl suture. The skin was closed with a 4 0 Vicryl subcuticular stitch. Dermabond was applied to the incision. The patient tolerated the procedure well and there were no complications.  Instrument sponge and needle counts were correct at the end of the case. Patient was taken to the recovery room in stable condition.  Ruta Hinds, MD [facility] Office: [phone] Pager: [phone]

## 2017-03-09 NOTE — Telephone Encounter (Signed)
Spoke to pt's wife to sch appt 04/21/17 at 3:30 as pt will be out of town.

## 2017-03-09 NOTE — Anesthesia Procedure Notes (Addendum)
Procedure Name: LMA Insertion Date/Time: 03/09/2017 1:42 PM Performed by: Willeen Cass P Pre-anesthesia Checklist: Patient identified, Emergency Drugs available, Suction available and Patient being monitored Patient Re-evaluated:Patient Re-evaluated prior to inductionOxygen Delivery Method: Circle System Utilized Preoxygenation: Pre-oxygenation with 100% oxygen Intubation Type: IV induction Ventilation: Mask ventilation without difficulty LMA: LMA inserted LMA Size: 4.0 Number of attempts: 1 Placement Confirmation: ETT inserted through vocal cords under direct vision,  positive ETCO2 and breath sounds checked- equal and bilateral Tube secured with: Tape Dental Injury: Teeth and Oropharynx as per pre-operative assessment  Comments: IV induction performed by Dr Therisa Doyne.

## 2017-03-10 ENCOUNTER — Encounter (HOSPITAL_COMMUNITY): Payer: Self-pay | Admitting: Vascular Surgery

## 2017-03-11 ENCOUNTER — Encounter (HOSPITAL_COMMUNITY): Payer: Self-pay | Admitting: Vascular Surgery

## 2017-03-11 NOTE — Transfer of Care (Signed)
Immediate Anesthesia Transfer of Care Note  Patient: Mariah [name]  Procedure(s) Performed: Procedure(s): LIGATION OF LEFT BASILIC VEIN TRANSPOSITION (Left)  Patient Location: PACU  Anesthesia Type:General  Level of Consciousness: awake, alert , oriented and patient cooperative  Airway & Oxygen Therapy: Patient Spontanous Breathing and Patient connected to nasal cannula oxygen  Post-op Assessment: Report given to RN and Post -op Vital signs reviewed and stable  Post vital signs: Reviewed and stable  Last Vitals:  Vitals:   03/09/17 1545 03/09/17 1554  BP: 121/64 135/70  Pulse: 70 72  Resp: 16 16  Temp:      Last Pain:  Vitals:   03/09/17 1554  TempSrc:   PainSc: 0-No pain      Patients Stated Pain Goal: 3 (22/57/50 5183)  Complications: No apparent anesthesia complications

## 2017-03-14 ENCOUNTER — Encounter: Payer: Self-pay | Admitting: Physician Assistant

## 2017-03-14 NOTE — Anesthesia Postprocedure Evaluation (Signed)
Anesthesia Post Note  Patient: Mariah [name]  Procedure(s) Performed: Procedure(s) (LRB): LIGATION OF LEFT BASILIC VEIN TRANSPOSITION (Left)     Patient location during evaluation: PACU Anesthesia Type: General Level of consciousness: awake and alert and patient cooperative Pain management: pain level controlled Vital Signs Assessment: post-procedure vital signs reviewed and stable Respiratory status: spontaneous breathing and respiratory function stable Cardiovascular status: stable Anesthetic complications: no    Last Vitals:  Vitals:   03/09/17 1545 03/09/17 1554  BP: 121/64 135/70  Pulse: 70 72  Resp: 16 16  Temp:      Last Pain:  Vitals:   03/09/17 1554  TempSrc:   PainSc: 0-No pain                 Jasn Xia S

## 2017-04-11 ENCOUNTER — Encounter: Payer: Self-pay | Admitting: Vascular Surgery

## 2017-04-14 ENCOUNTER — Encounter: Payer: BLUE CROSS/BLUE SHIELD | Admitting: Vascular Surgery

## 2017-04-21 ENCOUNTER — Encounter: Payer: Self-pay | Admitting: Vascular Surgery

## 2017-04-21 ENCOUNTER — Ambulatory Visit (INDEPENDENT_AMBULATORY_CARE_PROVIDER_SITE_OTHER): Payer: BLUE CROSS/BLUE SHIELD | Admitting: Vascular Surgery

## 2017-04-21 VITALS — BP 116/76 | HR 73 | Temp 97.8°F | Resp 16 | Ht 62.0 in | Wt 181.4 lb

## 2017-04-21 DIAGNOSIS — Z992 Dependence on renal dialysis: Secondary | ICD-10-CM

## 2017-04-21 NOTE — Progress Notes (Signed)
Patient is a 61 year old female who returns today after ligation of her left arm AV fistula for steal symptoms. She has a functioning kidney transplant. She states that all the numbness and tingling and aching in her left hand has completely resolved.  Physical exam:  Vitals:   04/21/17 1540  BP: 116/76  Pulse: 73  Resp: 16  Temp: 97.8 F (36.6 C)  TempSrc: Oral  SpO2: 93%  Weight: 181 lb 6.4 oz (82.3 kg)  Height: 5\' 2"  (1.575 m)    Extremities: Well-healed left arm incision 2+ left radial pulse in her assessment: Doing well status post fistula ligation.  Plan: The patient will follow-up on as-needed basis. It is okay to check blood pressures and needle sticks in her left arm at this point.  Ruta Hinds, MD [facility] Office: [phone] Pager: [phone]

## 2017-06-03 HISTORY — PX: LEFT ATRIAL APPENDAGE OCCLUSION: SHX173A

## 2017-07-18 ENCOUNTER — Other Ambulatory Visit: Payer: Self-pay | Admitting: *Deleted

## 2017-09-09 ENCOUNTER — Encounter: Payer: Self-pay | Admitting: Family Medicine

## 2017-09-09 ENCOUNTER — Ambulatory Visit: Payer: BLUE CROSS/BLUE SHIELD | Admitting: Family Medicine

## 2017-09-09 VITALS — BP 130/76 | HR 70 | Temp 98.1°F | Ht 62.0 in | Wt 181.0 lb

## 2017-09-09 DIAGNOSIS — J0100 Acute maxillary sinusitis, unspecified: Secondary | ICD-10-CM | POA: Diagnosis not present

## 2017-09-09 MED ORDER — AMOXICILLIN-POT CLAVULANATE 875-125 MG PO TABS: 1.0000 | ORAL_TABLET | Freq: Two times a day (BID) | ORAL | 0 refills | Status: DC

## 2017-09-09 NOTE — Progress Notes (Signed)
   Subjective:    Patient ID: Mariah [name], female    DOB: [birth year], 61 y.o.   MRN: [id number]  HPI 57 you female status post kidney transplant on 11/23/16 comes in today with 3 days of bilat maxillary sinus pressure and pain and congestion. Thinks may have had a fever last night but didn't check it.  Mild ST this morning.  No ear pain.  Taking benadryl to dry up her runny nose.  NO GI sxs.  Brought in her home BPs and weight which are all stable.     Review of Systems     Objective:   Physical Exam  Constitutional: She is oriented to person, place, and time. She appears well-developed and well-nourished.  HENT:  Head: Normocephalic and atraumatic.  Right Ear: External ear normal.  Left Ear: External ear normal.  Nose: Nose normal.  Mouth/Throat: Oropharynx is clear and moist.  TMs and canals are clear.   Eyes: Conjunctivae and EOM are normal. Pupils are equal, round, and reactive to light.  Neck: Neck supple. No thyromegaly present.  Cardiovascular: Normal rate, regular rhythm and normal heart sounds.  Pulmonary/Chest: Effort normal and breath sounds normal. She has no wheezes.  Lymphadenopathy:    She has no cervical adenopathy.  Neurological: She is alert and oriented to person, place, and time.  Skin: Skin is warm and dry.  Psychiatric: She has a normal mood and affect.          Assessment & Plan:  Acute bilateral maxillary sinusitis times 3 days-likely viral at this point.  Because she is high risk status post transplant I did go ahead and give her a prescription for an antibiotic to fill over the weekend for amoxicillin/clavulanic acid if she suddenly gets worse or if after the weekend she is not starting to improve.  Recommend symptomatic care such as running humidifier, nasal saline irrigation, hydration.

## 2017-09-18 ENCOUNTER — Telehealth: Payer: Self-pay | Admitting: *Deleted

## 2017-09-19 ENCOUNTER — Ambulatory Visit: Payer: BLUE CROSS/BLUE SHIELD | Admitting: Obstetrics & Gynecology

## 2017-09-20 ENCOUNTER — Ambulatory Visit: Payer: BLUE CROSS/BLUE SHIELD | Admitting: Nurse Practitioner

## 2017-10-07 ENCOUNTER — Other Ambulatory Visit: Payer: Self-pay | Admitting: Obstetrics & Gynecology

## 2017-10-07 DIAGNOSIS — Z1239 Encounter for other screening for malignant neoplasm of breast: Secondary | ICD-10-CM

## 2017-10-07 NOTE — Telephone Encounter (Signed)
Call to patient. Appointment canceled due to inclement weather. Recall entered.

## 2017-10-10 NOTE — Telephone Encounter (Signed)
Call to patient. Annual moved up to 11-14-17.

## 2017-10-10 NOTE — Telephone Encounter (Signed)
Patient has [facility]EX appt scheduled 01/24/18.  Encounter closed.

## 2017-10-17 ENCOUNTER — Ambulatory Visit (INDEPENDENT_AMBULATORY_CARE_PROVIDER_SITE_OTHER): Payer: Medicare Other | Admitting: Physician Assistant

## 2017-10-17 ENCOUNTER — Encounter: Payer: Self-pay | Admitting: Physician Assistant

## 2017-10-17 VITALS — BP 121/85 | HR 64 | Ht 62.0 in | Wt 181.0 lb

## 2017-10-17 DIAGNOSIS — F40243 Fear of flying: Secondary | ICD-10-CM | POA: Diagnosis not present

## 2017-10-17 DIAGNOSIS — Z87898 Personal history of other specified conditions: Secondary | ICD-10-CM | POA: Diagnosis not present

## 2017-10-17 DIAGNOSIS — M62838 Other muscle spasm: Secondary | ICD-10-CM | POA: Insufficient documentation

## 2017-10-17 DIAGNOSIS — Z7189 Other specified counseling: Secondary | ICD-10-CM

## 2017-10-17 DIAGNOSIS — Z7184 Encounter for health counseling related to travel: Secondary | ICD-10-CM

## 2017-10-17 DIAGNOSIS — R110 Nausea: Secondary | ICD-10-CM

## 2017-10-17 MED ORDER — SCOPOLAMINE 1 MG/3DAYS TD PT72: 1.0000 | MEDICATED_PATCH | TRANSDERMAL | 0 refills | Status: DC

## 2017-10-17 MED ORDER — CYCLOBENZAPRINE HCL 5 MG PO TABS: 5.0000 mg | ORAL_TABLET | Freq: Every day | ORAL | 5 refills | Status: DC

## 2017-10-17 MED ORDER — DIPHENOXYLATE-ATROPINE 2.5-0.025 MG PO TABS: 1.0000 | ORAL_TABLET | Freq: Four times a day (QID) | ORAL | 0 refills | Status: DC | PRN

## 2017-10-17 MED ORDER — DIAZEPAM 5 MG PO TABS
ORAL_TABLET | ORAL | 0 refills | Status: DC
Start: 2017-10-17 — End: 2018-11-20

## 2017-10-17 MED ORDER — ONDANSETRON HCL 8 MG PO TABS: 8.0000 mg | ORAL_TABLET | Freq: Three times a day (TID) | ORAL | 0 refills | Status: DC | PRN

## 2017-10-17 NOTE — Progress Notes (Signed)
Subjective:    Patient ID: Mariah [name], female    DOB: May 21, 1956, 62 y.o.   MRN: [id number]  HPI  Pt is a 62 yo female with PMH of PKD and s/p kidney transplant who presents to the clinic to get medications to go on a cruise. She is celebrating 1 year since her transplant with her donor.   She would like something for flying since she has a fear of flying. Something for nausea if she were to get nauseated. Something for motion sickness. Something for diarrhea.   .. Active Ambulatory Problems    Diagnosis Date Noted  . HYPERLIPIDEMIA 07/19/2006  . OBESITY, NOS 07/19/2006  . HYPERTENSION, BENIGN SYSTEMIC 07/19/2006  . KIDNEY STONE - NEPHROLITHIASIS 07/19/2006  . OSTEOARTHRITIS, MULTI SITES 07/19/2006  . Polycystic kidney 10/08/2008  . UNSPECIFIED VITAMIN D DEFICIENCY 10/09/2010  . Gout 10/09/2010  . GAMMA GLUTAMYL TRANSFERASE, SERUM, ELEVATED 10/13/2010  . Kidney disease, chronic, stage III (GFR 30-59 ml/min) (HCC) 11/06/2012  . Atrial fibrillation ([place]) 01/19/2013  . Palpitations 01/19/2013  . A-fib ([place]) 07/08/2014  . Injury of kidney 07/08/2014  . Acute respiratory failure ([place]) 07/11/2014  . 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 Absolute anemia 07/11/2014  . Anxiety 07/11/2014  . Tachycardia 09/11/2014  . Bilateral leg edema 09/11/2014  . Primary gout 09/11/2014  . Osteoarthritis of right knee 10/10/2014  . CKD (chronic kidney disease) stage 4, GFR 15-29 ml/min (HCC) 11/04/2014  . Mitral regurgitation 11/20/2014  . Hypertension 06/16/2015  . Hyperlipidemia 06/16/2015  . Poor venous access 10/28/2015  . Adenomatous polyp of colon 05/12/2016  . Paroxysmal atrial fibrillation ([place]) 01/27/2017  . 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 Hx of motion sickness 10/17/2017  . Muscle spasm 10/17/2017   Resolved Ambulatory Problems    Diagnosis Date Noted  . UTI 12/23/2008  . INFECTIOUS COLITIS ENTERITIS AND GASTROENTERITIS 10/09/2010  . Acute maxillary  sinusitis 12/22/2010  . Abdominal pain 07/10/2014  . BP (high blood pressure) 07/08/2014  . AKI (acute kidney injury) ([place]) 06/16/2015  . Fever 06/16/2015  . Left flank pain 03/09/2016   Past Medical History:  Diagnosis Date  . Anemia   . Arthritis   . Atrial fibrillation ([place]) 2012  . Cancer ([place])   . Dysrhythmia   . Gout   . History of kidney stones   . Hyperlipidemia   . Hypertension   . Pneumonia   . Polycystic kidney disease 11/2016  . PONV (postoperative nausea and vomiting)   . Pre-diabetes   . SDH (subdural hematoma) (HCC)        Review of Systems  All other systems reviewed and are negative.      Objective:   Physical Exam  Constitutional: She is oriented to person, place, and time. She appears well-developed and well-nourished.  HENT:  Head: Normocephalic and atraumatic.  Cardiovascular: Normal rate, regular rhythm and normal heart sounds.  Pulmonary/Chest: Effort normal and breath sounds normal.  Neurological: She is alert and oriented to person, place, and time.  Psychiatric: She has a normal mood and affect. Her behavior is normal.          Assessment & Plan:  [name] KitchenMarland KitchenDiagnoses and all orders for this visit:  Travel advice encounter -     diazepam (VALIUM) 5 MG tablet; As needed for anxiety due to flying. -     ondansetron (ZOFRAN) 8 MG tablet; Take 1 tablet (8 mg total) by mouth every 8 (eight) hours as needed for nausea or  vomiting. -     diphenoxylate-atropine (LOMOTIL) 2.5-0.025 MG tablet; Take 1 tablet by mouth 4 (four) times daily as needed for diarrhea or loose stools. -     scopolamine (TRANSDERM-SCOP, 1.5 MG,) 1 MG/3DAYS; Place 1 patch (1.5 mg total) onto the skin every 3 (three) days.  Fear of flying -     diazepam (VALIUM) 5 MG tablet; As needed for anxiety due to flying.  Hx of motion sickness -     scopolamine (TRANSDERM-SCOP, 1.5 MG,) 1 MG/3DAYS; Place 1 patch (1.5 mg total) onto the skin every 3 (three) days.  Nausea -      ondansetron (ZOFRAN) 8 MG tablet; Take 1 tablet (8 mg total) by mouth every 8 (eight) hours as needed for nausea or vomiting.  Muscle spasm -     cyclobenzaprine (FLEXERIL) 5 MG tablet; Take 1 tablet (5 mg total) by mouth at bedtime.   Discussed vaccines up to date. Encouraged not to drink water or drinks with ice in them off cruise ship.  Follow up as needed.

## 2017-11-02 ENCOUNTER — Encounter: Payer: Self-pay | Admitting: Physician Assistant

## 2017-11-09 ENCOUNTER — Ambulatory Visit: Payer: Medicare Other

## 2017-11-10 ENCOUNTER — Ambulatory Visit (INDEPENDENT_AMBULATORY_CARE_PROVIDER_SITE_OTHER): Payer: Medicare Other

## 2017-11-10 ENCOUNTER — Ambulatory Visit: Payer: Medicare Other

## 2017-11-10 DIAGNOSIS — Z1239 Encounter for other screening for malignant neoplasm of breast: Secondary | ICD-10-CM

## 2017-11-10 DIAGNOSIS — Z1231 Encounter for screening mammogram for malignant neoplasm of breast: Secondary | ICD-10-CM | POA: Diagnosis not present

## 2017-11-14 ENCOUNTER — Ambulatory Visit (INDEPENDENT_AMBULATORY_CARE_PROVIDER_SITE_OTHER): Payer: Medicare Other | Admitting: Obstetrics & Gynecology

## 2017-11-14 ENCOUNTER — Other Ambulatory Visit (HOSPITAL_COMMUNITY)
Admission: RE | Admit: 2017-11-14 | Discharge: 2017-11-14 | Disposition: A | Discharge status: Home / Self Care | Payer: Medicare Other | Source: Ambulatory Visit | Attending: Obstetrics & Gynecology | Admitting: Obstetrics & Gynecology

## 2017-11-14 ENCOUNTER — Encounter: Payer: Self-pay | Admitting: Obstetrics & Gynecology

## 2017-11-14 ENCOUNTER — Other Ambulatory Visit: Payer: Self-pay

## 2017-11-14 VITALS — BP 118/68 | HR 68 | Resp 14 | Ht 61.5 in | Wt 187.0 lb

## 2017-11-14 DIAGNOSIS — Z124 Encounter for screening for malignant neoplasm of cervix: Secondary | ICD-10-CM | POA: Insufficient documentation

## 2017-11-14 DIAGNOSIS — Z01411 Encounter for gynecological examination (general) (routine) with abnormal findings: Secondary | ICD-10-CM | POA: Diagnosis not present

## 2017-11-14 DIAGNOSIS — N200 Calculus of kidney: Secondary | ICD-10-CM | POA: Insufficient documentation

## 2017-11-14 NOTE — Progress Notes (Signed)
62 y.o. G0P0000 MarriedCaucasianF here for annual exam.  Had renal transplant last year--2/131/18.  She's named her kidney "Anastasia".  Doing well.  Has Afib issues last year.  Just ocassionally has a fib now but will go right back into normal sinus rhythm.  Feels so much better.  Has become good friends with donor and her spouse.    Patient's last menstrual period was 07/11/2009 (approximate).          Sexually active: Yes.    The current method of family planning is post menopausal status.    Exercising: No.  The patient does not participate in regular exercise at present. Smoker:  Former smoker   Health Maintenance: Pap:  09/09/15 negative, HR HPV negative  History of abnormal Pap:  no MMG:  11/10/17 BIRADS 1 negative  Colonoscopy:  08/04/15 tubular adenoma, repeat 5 years  BMD:   01/06/11 normal  TDaP:  07/27/11 Pneumonia vaccine(s):  04/29/16  Shingrix:   12/26/12  Hep C testing: 09/16/15 negative  Screening Labs: done recently at [facility], Hb today: same, Urine today: has sample if needed    reports that she quit smoking about 20 years ago. Her smoking use included cigarettes. She has a 48.00 pack-year smoking history. she has never used smokeless tobacco. She reports that she drinks about 1.2 oz of alcohol per week. She reports that she does not use drugs.  Past Medical History:  Diagnosis Date  . Anemia   . Arthritis   . Atrial fibrillation ([place]) 2012  . Cancer ([place])    nose skin cancer- "not melanoma"  . Dysrhythmia    afib  . Gout   . History of kidney stones   . Hyperlipidemia   . Hypertension   . Pneumonia    03/08/17- many years ago  . Polycystic kidney disease 11/2016   transplant   . PONV (postoperative nausea and vomiting)   . Pre-diabetes    due to antirejection medication  . SDH (subdural hematoma) (HCC)     Past Surgical History:  Procedure Laterality Date  . BASCILIC VEIN TRANSPOSITION Left 09/07/2016   Procedure: BASILIC VEIN TRANSPOSITION LEFT UPPER  ARM;  Surgeon: Elam Dutch, MD;  Location: [facility];  Service: Vascular;  Laterality: Left;  . COLONOSCOPY  03/2004   repeat 10 years  . Evacuation of subdural hematoma  11/26/2000  . KIDNEY STONE SURGERY     Cysto  . KIDNEY TRANSPLANT     Nov 23, 2016  . LEFT ATRIAL APPENDAGE OCCLUSION  06/03/2017   Dr. Remus Blake   . LIGATION OF ARTERIOVENOUS  FISTULA Left 03/09/2017   Procedure: LIGATION OF LEFT BASILIC VEIN TRANSPOSITION;  Surgeon: Elam Dutch, MD;  Location: [facility];  Service: Vascular;  Laterality: Left;  . ORIF ELBOW FRACTURE  10/22/2011   Procedure: OPEN REDUCTION INTERNAL FIXATION (ORIF) ELBOW/OLECRANON FRACTURE;  Surgeon: Schuyler Amor, MD;  Location: [facility];  Service: Orthopedics;  Laterality: Left;  open reduction  possible lateral reconstruction with palmaris graft from left or right side.  [name] Kitchen RADIAL HEAD IMPLANT  09/24/2011   Procedure: RADIAL HEAD IMPLANT;  Surgeon: Schuyler Amor, MD;  Location: [facility];  Service: Orthopedics;  Laterality: Left;  left radial head replacement  . TONSILLECTOMY      Current Outpatient Medications  Medication Sig Dispense Refill  . acetaminophen (TYLENOL) 500 MG tablet Take 1,000 mg by mouth every 6 (six) hours as needed for fever (pain).    [name] Kitchen aspirin  EC 81 MG tablet Take 81 mg by mouth daily.    [name] Kitchen atorvastatin (LIPITOR) 40 MG tablet Take 40 mg by mouth every Monday, Wednesday, and Friday.     . cyclobenzaprine (FLEXERIL) 5 MG tablet Take 1 tablet (5 mg total) by mouth at bedtime. 30 tablet 5  . diazepam (VALIUM) 5 MG tablet As needed for anxiety due to flying. 15 tablet 0  . diphenoxylate-atropine (LOMOTIL) 2.5-0.025 MG tablet Take 1 tablet by mouth 4 (four) times daily as needed for diarrhea or loose stools. 30 tablet 0  . loratadine (CLARITIN) 10 MG tablet Take 10 mg by mouth daily as needed (seasonal allergies). Daily with lunch    . metFORMIN (GLUCOPHAGE) 500 MG tablet Take 500 mg by mouth 2  (two) times daily with a meal.     . Multiple Vitamins-Minerals (CENTRUM ADULTS PO) Take by mouth.    . mycophenolate (MYFORTIC) 180 MG EC tablet TAKE 2 TABLETS BY MOUTH 2 TIMES DAILY.    [name] Kitchen ondansetron (ZOFRAN) 8 MG tablet Take 1 tablet (8 mg total) by mouth every 8 (eight) hours as needed for nausea or vomiting. 20 tablet 0  . Probiotic Product (PROBIOTIC-10 PO) Take by mouth.    . ranitidine (ZANTAC) 150 MG tablet Take 150 mg by mouth.    . sotalol (BETAPACE) 80 MG tablet Take 80 mg by mouth.    . sulfamethoxazole-trimethoprim (BACTRIM,SEPTRA) 400-80 MG tablet Take 1 tablet by mouth 3 (three) times a week.     . tacrolimus (PROGRAF) 1 MG capsule Take 5 mg in the AM and 4 mg in the PM by mouth daily    . venlafaxine XR (EFFEXOR-XR) 37.5 MG 24 hr capsule Take 37.5 mg by mouth every other day.      No current facility-administered medications for this visit.     Family History  Problem Relation Age of Onset  . Polycystic kidney disease Father   . Heart failure Father   . Heart disease Mother        Rheumatic fever    ROS:  Pertinent items are noted in HPI.  Otherwise, a comprehensive ROS was negative.  Exam:   BP 118/68 (BP Location: Right Arm, Patient Position: Sitting, Cuff Size: Normal)   Pulse 68   Resp 14   Ht 5' 1.5" (1.562 m)   Wt 187 lb (84.8 kg)   LMP 07/11/2009 (Approximate)   BMI 34.76 kg/m   Weight change: +1# Height: 5' 1.5" (156.2 cm)  Ht Readings from Last 3 Encounters:  11/14/17 5' 1.5" (1.562 m)  10/17/17 5\' 2"  (1.575 m)  09/09/17 5\' 2"  (1.575 m)    General appearance: alert, cooperative and appears stated age Head: Normocephalic, without obvious abnormality, atraumatic Neck: no adenopathy, supple, symmetrical, trachea midline and thyroid normal to inspection and palpation Lungs: clear to auscultation bilaterally Breasts: normal appearance, no masses or tenderness Heart: regular rate and rhythm Abdomen: soft, non-tender; bowel sounds normal; no masses,   Pelvic kidney noted Extremities: extremities normal, atraumatic, no cyanosis or edema, large scar on inner left arm from fistula removal Skin: Skin color, texture, turgor normal. No rashes or lesions Lymph nodes: Cervical, supraclavicular, and axillary nodes normal. No abnormal inguinal nodes palpated Neurologic: Grossly normal   Pelvic: External genitalia:  no lesions              Urethra:  normal appearing urethra with no masses, tenderness or lesions  Bartholins and Skenes: normal                 Vagina: normal appearing vagina with normal color and discharge, no lesions              Cervix: no lesions              Pap taken: Yes.    Bimanual Exam:  Uterus:  normal size, contour, position, consistency, mobility, non-tender              Adnexa: normal adnexa and no mass, fullness, tenderness               Rectovaginal: Confirms               Anus:  normal sphincter tone, no lesions  Chaperone was present for exam.  A:  Well Woman with normal exam PMP, no HRT H/O living donor renal transplant (due to polycystic kidney disease) Fibromyalgia H/O situation depression and anxiety, improved H/o SVT and Afib, under control  P:   Mammogram guidelines reviewed.  Doing 3D. pap smear obtained today.  D/w pt date regarding pt's with immunosuppression so agreed to do yearly pap smears with every 3 year HPV testing Lab work and vaccines are UTD RF for Venlafaxine 37.5mg  daily.  #90/4RF. Return annually or prn

## 2017-11-17 LAB — CYTOLOGY - PAP: DIAGNOSIS: NEGATIVE

## 2017-11-17 MED ORDER — VENLAFAXINE HCL ER 37.5 MG PO CP24: 37.5000 mg | ORAL_CAPSULE | ORAL | 4 refills | Status: DC

## 2017-11-20 ENCOUNTER — Encounter: Payer: Self-pay | Admitting: Obstetrics & Gynecology

## 2017-11-20 ENCOUNTER — Encounter: Payer: Self-pay | Admitting: Physician Assistant

## 2017-11-23 ENCOUNTER — Encounter: Payer: Self-pay | Admitting: Physician Assistant

## 2017-12-06 ENCOUNTER — Encounter: Payer: Self-pay | Admitting: Physician Assistant

## 2017-12-07 DIAGNOSIS — Z95818 Presence of other cardiac implants and grafts: Secondary | ICD-10-CM | POA: Insufficient documentation

## 2017-12-20 ENCOUNTER — Ambulatory Visit (INDEPENDENT_AMBULATORY_CARE_PROVIDER_SITE_OTHER): Payer: Medicare Other | Admitting: Physician Assistant

## 2017-12-20 VITALS — BP 122/65 | HR 78 | Temp 97.7°F | Resp 16 | Wt 188.4 lb

## 2017-12-20 DIAGNOSIS — Z23 Encounter for immunization: Secondary | ICD-10-CM | POA: Diagnosis not present

## 2017-12-20 NOTE — Progress Notes (Signed)
HPI: Patient is here for Prevnar 13 vaccination. Patient denies chest pains, shortness of breathe, palpations or any vaccine allergies.  Assessment and Plan: Administered vaccination - Left deltoid without any complications.   Agree with above plan. Iran Planas PA-C

## 2018-01-20 LAB — HEPATIC FUNCTION PANEL
ALT: 16 (ref 7–35)
AST: 16 (ref 13–35)
Alkaline Phosphatase: 179 — AB (ref 25–125)
Bilirubin, Total: 0.4

## 2018-01-20 LAB — BASIC METABOLIC PANEL
BUN: 17 (ref 4–21)
CREATININE: 0.8 (ref 0.5–1.1)
GLUCOSE: 93
POTASSIUM: 4.6 (ref 3.4–5.3)
Sodium: 139 (ref 137–147)

## 2018-01-20 LAB — CBC AND DIFFERENTIAL
HCT: 35 — AB (ref 36–46)
Hemoglobin: 11.5 — AB (ref 12.0–16.0)
Platelets: 424 — AB (ref 150–399)
WBC: 6

## 2018-01-24 ENCOUNTER — Ambulatory Visit: Payer: Self-pay | Admitting: Obstetrics & Gynecology

## 2018-03-01 ENCOUNTER — Encounter: Payer: Self-pay | Admitting: Physician Assistant

## 2018-03-13 ENCOUNTER — Other Ambulatory Visit: Payer: Self-pay | Admitting: *Deleted

## 2018-03-13 ENCOUNTER — Encounter: Payer: Self-pay | Admitting: Physician Assistant

## 2018-03-13 MED ORDER — FLUCONAZOLE 150 MG PO TABS: 150.0000 mg | ORAL_TABLET | Freq: Once | ORAL | 1 refills | Status: AC

## 2018-04-05 ENCOUNTER — Other Ambulatory Visit: Payer: Self-pay | Admitting: Physician Assistant

## 2018-04-05 DIAGNOSIS — M62838 Other muscle spasm: Secondary | ICD-10-CM

## 2018-04-14 LAB — CBC AND DIFFERENTIAL
HCT: 36 (ref 36–46)
Hemoglobin: 12.2 (ref 12.0–16.0)
PLATELETS: 250 (ref 150–399)
WBC: 4.7

## 2018-04-14 LAB — BASIC METABOLIC PANEL
BUN: 23 — AB (ref 4–21)
CREATININE: 1 (ref 0.5–1.1)
Glucose: 104
Potassium: 4.5 (ref 3.4–5.3)
Sodium: 138 (ref 137–147)

## 2018-04-14 LAB — HEPATIC FUNCTION PANEL
ALT: 15 (ref 7–35)
AST: 15 (ref 13–35)
Alkaline Phosphatase: 142 — AB (ref 25–125)
Bilirubin, Total: 0.5

## 2018-05-02 ENCOUNTER — Encounter: Payer: Self-pay | Admitting: Physician Assistant

## 2018-05-18 ENCOUNTER — Encounter: Payer: Self-pay | Admitting: Physician Assistant

## 2018-06-10 ENCOUNTER — Other Ambulatory Visit: Payer: Self-pay

## 2018-06-10 ENCOUNTER — Emergency Department
Admission: EM | Admit: 2018-06-10 | Discharge: 2018-06-10 | Disposition: A | Discharge status: Home / Self Care | Payer: Medicare Other | Source: Home / Self Care | Attending: Family Medicine | Admitting: Family Medicine

## 2018-06-10 DIAGNOSIS — R1011 Right upper quadrant pain: Secondary | ICD-10-CM | POA: Diagnosis not present

## 2018-06-10 DIAGNOSIS — N201 Calculus of ureter: Secondary | ICD-10-CM

## 2018-06-10 LAB — POCT URINALYSIS DIP (MANUAL ENTRY)
Bilirubin, UA: NEGATIVE
GLUCOSE UA: NEGATIVE mg/dL
Ketones, POC UA: NEGATIVE mg/dL
Leukocytes, UA: NEGATIVE
NITRITE UA: NEGATIVE
PH UA: 6.5 (ref 5.0–8.0)
Protein Ur, POC: NEGATIVE mg/dL
RBC UA: NEGATIVE
Urobilinogen, UA: 0.2 E.U./dL

## 2018-06-10 MED ORDER — CEPHALEXIN 500 MG PO CAPS: 500.0000 mg | ORAL_CAPSULE | Freq: Three times a day (TID) | ORAL | 0 refills | Status: AC

## 2018-06-10 NOTE — ED Notes (Signed)
CORRECTION: PT BP WAS 100/70

## 2018-06-10 NOTE — Discharge Instructions (Addendum)
Continue adequate fluid intake. If symptoms become significantly worse during the night or over the weekend, proceed to the local emergency room.

## 2018-06-10 NOTE — ED Triage Notes (Signed)
Pt c/o kidney pain x 2 days. Hx of kidney transplant and polycystic kidney disease. Was referred to UC by her nephrologist at [facility]. Pain is radiating down her side - took hydromorphone at about 12pm. Pain level 8/10

## 2018-06-11 LAB — URINE CULTURE
MICRO NUMBER:: 91046675
SPECIMEN QUALITY:: ADEQUATE

## 2018-06-11 NOTE — ED Provider Notes (Signed)
[facility]    CSN: [id number] Arrival date & time: 06/10/18  1601     History   Chief Complaint Chief Complaint  Patient presents with  . Flank Pain    cyst    HPI Mariah [name] is a 62 y.o. female.   Patient is s/p kidney transplant (unrelated donor) as a result of polycystic kidney disease.  She complains of two day history of right flank and abdominal pain, without urinary symptoms.  She was referred by her nephrologist for evaluation.  She reports that her pain was severe enough to require hydromorphone at noon.  She denies fevers, chills, and sweats, and no nausea/vomiting.  The history is provided by the patient.  Flank Pain  This is a new problem. The current episode started 2 days ago. The problem occurs constantly. The problem has not changed since onset.Associated symptoms include abdominal pain. Pertinent negatives include no headaches. Nothing aggravates the symptoms. Nothing relieves the symptoms. Treatments tried: hydromorphone. The treatment provided moderate relief.    Past Medical History:  Diagnosis Date  . Anemia   . Arthritis   . Atrial fibrillation ([place]) 2012  . Cancer ([place])    nose skin cancer- "not melanoma"  . Dysrhythmia    afib  . Gout   . History of kidney stones   . Hyperlipidemia   . Hypertension   . Pneumonia    03/08/17- many years ago  . Polycystic kidney disease 11/2016   transplant   . PONV (postoperative nausea and vomiting)   . Pre-diabetes    due to antirejection medication  . SDH (subdural hematoma) [facility])     Patient Active Problem List   Diagnosis Date Noted  . Nephrolithiasis 11/14/2017  . 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 Hx of motion sickness 10/17/2017  . Muscle spasm 10/17/2017  . Paroxysmal atrial fibrillation ([place]) 01/27/2017  . 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 History of kidney transplant 11/29/2016  . Immunosuppression ([place]) 11/29/2016  . Adenomatous  polyp of colon 05/12/2016  . Poor venous access 10/28/2015  . Hypertension 06/16/2015  . Hyperlipidemia 06/16/2015  . Mitral regurgitation 11/20/2014  . CKD (chronic kidney disease) stage 4, GFR 15-29 ml/min (HCC) 11/04/2014  . Osteoarthritis of right knee 10/10/2014  . Iron deficiency anemia, unspecified 09/25/2014  . Tachycardia 09/11/2014  . Bilateral leg edema 09/11/2014  . Primary gout 09/11/2014  . Acute respiratory failure ([place]) 07/11/2014  . Absolute anemia 07/11/2014  . Anxiety 07/11/2014  . A-fib ([place]) 07/08/2014  . Injury of kidney 07/08/2014  . 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 Atrial fibrillation ([place]) 01/19/2013  . Palpitations 01/19/2013  . Kidney disease, chronic, stage III (GFR 30-59 ml/min) (HCC) 11/06/2012  . GAMMA GLUTAMYL TRANSFERASE, SERUM, ELEVATED 10/13/2010  . UNSPECIFIED VITAMIN D DEFICIENCY 10/09/2010  . Gout 10/09/2010  . Polycystic kidney 10/08/2008  . HYPERLIPIDEMIA 07/19/2006  . OBESITY, NOS 07/19/2006  . HYPERTENSION, BENIGN SYSTEMIC 07/19/2006  . KIDNEY STONE - NEPHROLITHIASIS 07/19/2006  . OSTEOARTHRITIS, MULTI SITES 07/19/2006    Past Surgical History:  Procedure Laterality Date  . BASCILIC VEIN TRANSPOSITION Left 09/07/2016   Procedure: BASILIC VEIN TRANSPOSITION LEFT UPPER ARM;  Surgeon: Elam Dutch, MD;  Location: [place];  Service: Vascular;  Laterality: Left;  . COLONOSCOPY  03/2004   repeat 10 years  . Evacuation of subdural hematoma  11/26/2000  . KIDNEY STONE SURGERY     Cysto  . KIDNEY TRANSPLANT  Nov 23, 2016  . LEFT ATRIAL APPENDAGE OCCLUSION  06/03/2017   Dr. Remus Blake   . LIGATION OF ARTERIOVENOUS  FISTULA Left 03/09/2017   Procedure: LIGATION OF LEFT BASILIC VEIN TRANSPOSITION;  Surgeon: Elam Dutch, MD;  Location: [place];  Service: Vascular;  Laterality: Left;  . ORIF ELBOW FRACTURE  10/22/2011   Procedure: OPEN REDUCTION INTERNAL FIXATION (ORIF) ELBOW/OLECRANON FRACTURE;  Surgeon: Schuyler Amor, MD;  Location: [place];  Service: Orthopedics;  Laterality: Left;  open reduction  possible lateral reconstruction with palmaris graft from left or right side.  [name] Kitchen RADIAL HEAD IMPLANT  09/24/2011   Procedure: RADIAL HEAD IMPLANT;  Surgeon: Schuyler Amor, MD;  Location: [place];  Service: Orthopedics;  Laterality: Left;  left radial head replacement  . TONSILLECTOMY      OB History    Gravida  0   Para  0   Term  0   Preterm  0   AB  0   Living  0     SAB  0   TAB  0   Ectopic  0   Multiple  0   Live Births  0            Home Medications    Prior to Admission medications   Medication Sig Start Date End Date Taking? Authorizing Provider  acetaminophen (TYLENOL) 500 MG tablet Take 1,000 mg by mouth every 6 (six) hours as needed for fever (pain).    [provider]  aspirin EC 81 MG tablet Take 81 mg by mouth daily.    [provider]  atorvastatin (LIPITOR) 40 MG tablet Take 40 mg by mouth every Monday, Wednesday, and Friday.     [provider]  cephALEXin (KEFLEX) 500 MG capsule Take 1 capsule (500 mg total) by mouth 3 (three) times daily for 10 days. 06/10/18 06/20/18  Kandra Nicolas, MD  cyclobenzaprine (FLEXERIL) 5 MG tablet TAKE 1 TABLET BY MOUTH AT BEDTIME 04/05/18   Breeback, [name] L, PA-C  diazepam (VALIUM) 5 MG tablet As needed for anxiety due to flying. 10/17/17   Breeback, Royetta Car, PA-C  diphenoxylate-atropine (LOMOTIL) 2.5-0.025 MG tablet Take 1 tablet by mouth 4 (four) times daily as needed for diarrhea or loose stools. 10/17/17   Breeback, [name] L, PA-C  loratadine (CLARITIN) 10 MG tablet Take 10 mg by mouth daily as needed (seasonal allergies). Daily with lunch    [provider]  metFORMIN (GLUCOPHAGE) 500 MG tablet Take 500 mg by mouth 2 (two) times daily with a meal.     [provider]  Multiple Vitamins-Minerals (CENTRUM ADULTS PO) Take by mouth.    [provider]  mycophenolate (MYFORTIC) 180 MG EC tablet TAKE 2 TABLETS BY MOUTH 2 TIMES DAILY. 04/19/17   [provider]  ondansetron (ZOFRAN) 8 MG tablet Take 1 tablet (8 mg total) by mouth every 8 (eight) hours as needed for nausea or vomiting. 10/17/17   Breeback, [name] L, PA-C  Probiotic Product (PROBIOTIC-10 PO) Take by mouth.    [provider]  ranitidine (ZANTAC) 150 MG tablet Take 150 mg by mouth. 07/12/17   [provider]  sotalol (BETAPACE) 80 MG tablet Take 80 mg by mouth. 06/05/17 06/05/18  [provider]  sulfamethoxazole-trimethoprim (BACTRIM,SEPTRA) 400-80 MG tablet Take 1 tablet by mouth 3 (three) times a week.     [provider]  tacrolimus (PROGRAF) 1 MG capsule Take 5 mg in  the AM and 4 mg in the PM by mouth daily 09/20/17   [provider]  venlafaxine XR (EFFEXOR-XR) 37.5 MG 24 hr capsule Take 1 capsule (37.5 mg total) by mouth every other day. 11/17/17   Megan Salon, MD    Family History Family History  Problem Relation Age of Onset  . Polycystic kidney disease Father   . Heart failure Father   . Heart disease Mother        Rheumatic fever    Social History Social History   Tobacco Use  . Smoking status: Former Smoker    Packs/day: 2.00    Years: 24.00    Pack years: 48.00    Types: Cigarettes    Last attempt to quit: 12/12/1996    Years since quitting: 21.5  . Smokeless tobacco: Never Used  Substance Use Topics  . Alcohol use: Yes    Alcohol/week: 2.0 standard drinks    Types: 2 Cans of beer per week  . Drug use: No     Allergies   Codeine; Morphine; Poison ivy extract; and Zovirax [acyclovir sodium]   Review of Systems Review of Systems  Constitutional: Positive for activity change, appetite change and fatigue. Negative for chills, diaphoresis and fever.  HENT: Negative.   Eyes: Negative.   Respiratory: Negative.   Cardiovascular: Negative.   Gastrointestinal: Positive for abdominal pain.  Negative for abdominal distention, blood in stool, diarrhea, nausea and vomiting.  Genitourinary: Positive for flank pain. Negative for difficulty urinating, dysuria, frequency, hematuria, pelvic pain and urgency.  Skin: Negative.   Neurological: Negative for headaches.     Physical Exam Triage Vital Signs ED Triage Vitals [06/10/18 1630]  Enc Vitals Group     BP (!) 100/10     Pulse Rate 76     Resp      Temp (!) 97.5 F (36.4 C)     Temp Source Oral     SpO2 95 %     Weight 185 lb (83.9 kg)     Height 5\' 2"  (1.575 m)     Head Circumference      Peak Flow      Pain Score 8     Pain Loc      Pain Edu?      Excl. in [place]?    No data found.  Updated Vital Signs BP (!) 100/10 (BP Location: Right Arm)   Pulse 76   Temp (!) 97.5 F (36.4 C) (Oral)   Ht 5\' 2"  (1.575 m)   Wt 83.9 kg   LMP 07/11/2009 (Approximate)   SpO2 95%   BMI 33.84 kg/m   Visual Acuity Right Eye Distance:   Left Eye Distance:   Bilateral Distance:    Right Eye Near:   Left Eye Near:    Bilateral Near:     Physical Exam Nursing notes and Vital Signs reviewed. Appearance:  Patient appears stated age, and in no acute distress.    Eyes:  Pupils are equal, round, and reactive to light and accomodation.  Extraocular movement is intact.  Conjunctivae are not inflamed   Pharynx:  Normal; moist mucous membranes  Neck:  Supple.  No adenopathy Lungs:  Clear to auscultation.  Breath sounds are equal.  Moving air well. Heart:  Regular rate and rhythm without murmurs, rubs, or gallops.  Abdomen:  Tenderness right flank and right upper quadrant abdomen without masses or hepatosplenomegaly.  Bowel sounds are present.  No CVA or flank tenderness.  Extremities:  No edema.  Skin:  No rash present.     UC Treatments / Results  Labs (all labs ordered are listed, but only abnormal results are displayed) Labs Reviewed  POCT URINALYSIS DIP (MANUAL ENTRY) - Abnormal; Notable for the following components:       Result Value   Spec Grav, UA <=1.005 (*)    All other components within normal limits  URINE CULTURE    EKG None  Radiology No results found.  Procedures Procedures (including critical care time)  Medications Ordered in UC Medications - No data to display  Initial Impression / Assessment and Plan / UC Course  I have reviewed the triage vital signs and the nursing notes.  Pertinent labs & imaging results that were available during my care of the patient were reviewed by me and considered in my medical decision making (see chart for details).    ?UTI Normal Urinalysis (dipstick) reassuring.  Urine culture pending.  Begin empiric Keflex. Followup with nephrologist as soon as possible.   Final Clinical Impressions(s) / UC Diagnoses   Final diagnoses:  Abdominal pain, right upper quadrant     Discharge Instructions     Continue adequate fluid intake. If symptoms become significantly worse during the night or over the weekend, proceed to the local emergency room.    ED Prescriptions    Medication Sig Dispense Auth. Provider   cephALEXin (KEFLEX) 500 MG capsule Take 1 capsule (500 mg total) by mouth 3 (three) times daily for 10 days. 30 capsule Kandra Nicolas, MD        Kandra Nicolas, MD 06/11/18 2239

## 2018-06-12 ENCOUNTER — Telehealth: Payer: Self-pay | Admitting: Emergency Medicine

## 2018-06-15 DIAGNOSIS — K7689 Other specified diseases of liver: Secondary | ICD-10-CM | POA: Insufficient documentation

## 2018-06-17 ENCOUNTER — Emergency Department (HOSPITAL_BASED_OUTPATIENT_CLINIC_OR_DEPARTMENT_OTHER): Payer: Medicare Other

## 2018-06-17 ENCOUNTER — Emergency Department (HOSPITAL_BASED_OUTPATIENT_CLINIC_OR_DEPARTMENT_OTHER)
Admission: EM | Admit: 2018-06-17 | Discharge: 2018-06-17 | Disposition: A | Discharge status: Other Acute Inpatient Hospital | Payer: Medicare Other | Attending: Emergency Medicine | Admitting: Emergency Medicine

## 2018-06-17 ENCOUNTER — Encounter (HOSPITAL_BASED_OUTPATIENT_CLINIC_OR_DEPARTMENT_OTHER): Payer: Self-pay | Admitting: *Deleted

## 2018-06-17 ENCOUNTER — Other Ambulatory Visit: Payer: Self-pay

## 2018-06-17 DIAGNOSIS — R1011 Right upper quadrant pain: Secondary | ICD-10-CM | POA: Diagnosis not present

## 2018-06-17 DIAGNOSIS — Z85820 Personal history of malignant melanoma of skin: Secondary | ICD-10-CM | POA: Insufficient documentation

## 2018-06-17 DIAGNOSIS — Z7984 Long term (current) use of oral hypoglycemic drugs: Secondary | ICD-10-CM | POA: Insufficient documentation

## 2018-06-17 DIAGNOSIS — Z940 Kidney transplant status: Secondary | ICD-10-CM | POA: Insufficient documentation

## 2018-06-17 DIAGNOSIS — R1084 Generalized abdominal pain: Secondary | ICD-10-CM

## 2018-06-17 DIAGNOSIS — I129 Hypertensive chronic kidney disease with stage 1 through stage 4 chronic kidney disease, or unspecified chronic kidney disease: Secondary | ICD-10-CM | POA: Diagnosis not present

## 2018-06-17 DIAGNOSIS — Z87891 Personal history of nicotine dependence: Secondary | ICD-10-CM | POA: Diagnosis not present

## 2018-06-17 DIAGNOSIS — Z7982 Long term (current) use of aspirin: Secondary | ICD-10-CM | POA: Diagnosis not present

## 2018-06-17 DIAGNOSIS — N184 Chronic kidney disease, stage 4 (severe): Secondary | ICD-10-CM | POA: Diagnosis not present

## 2018-06-17 DIAGNOSIS — Z79899 Other long term (current) drug therapy: Secondary | ICD-10-CM | POA: Diagnosis not present

## 2018-06-17 LAB — CBC WITH DIFFERENTIAL/PLATELET
Basophils Absolute: 0 10*3/uL (ref 0.0–0.1)
Basophils Relative: 0 %
EOS PCT: 1 %
Eosinophils Absolute: 0.1 10*3/uL (ref 0.0–0.7)
HCT: 28 % — ABNORMAL LOW (ref 36.0–46.0)
Hemoglobin: 8.9 g/dL — ABNORMAL LOW (ref 12.0–15.0)
LYMPHS ABS: 0.7 10*3/uL (ref 0.7–4.0)
Lymphocytes Relative: 6 %
MCH: 26 pg (ref 26.0–34.0)
MCHC: 31.8 g/dL (ref 30.0–36.0)
MCV: 81.9 fL (ref 78.0–100.0)
MONO ABS: 1.5 10*3/uL — AB (ref 0.1–1.0)
MONOS PCT: 14 %
Neutro Abs: 8.8 10*3/uL — ABNORMAL HIGH (ref 1.7–7.7)
Neutrophils Relative %: 79 %
PLATELETS: 406 10*3/uL — AB (ref 150–400)
RBC: 3.42 MIL/uL — AB (ref 3.87–5.11)
RDW: 14.8 % (ref 11.5–15.5)
WBC: 11.1 10*3/uL — ABNORMAL HIGH (ref 4.0–10.5)

## 2018-06-17 LAB — COMPREHENSIVE METABOLIC PANEL
ALT: 27 U/L (ref 0–44)
ANION GAP: 12 (ref 5–15)
AST: 24 U/L (ref 15–41)
Albumin: 3.5 g/dL (ref 3.5–5.0)
Alkaline Phosphatase: 257 U/L — ABNORMAL HIGH (ref 38–126)
BUN: 23 mg/dL (ref 8–23)
CHLORIDE: 96 mmol/L — AB (ref 98–111)
CO2: 22 mmol/L (ref 22–32)
Calcium: 8.5 mg/dL — ABNORMAL LOW (ref 8.9–10.3)
Creatinine, Ser: 1.63 mg/dL — ABNORMAL HIGH (ref 0.44–1.00)
GFR, EST AFRICAN AMERICAN: 38 mL/min — AB (ref >60)
GFR, EST NON AFRICAN AMERICAN: 33 mL/min — AB (ref >60)
GLUCOSE: 144 mg/dL — AB (ref 70–99)
Potassium: 4.1 mmol/L (ref 3.5–5.1)
Sodium: 130 mmol/L — ABNORMAL LOW (ref 135–145)
TOTAL PROTEIN: 7.2 g/dL (ref 6.5–8.1)
Total Bilirubin: 0.4 mg/dL (ref 0.3–1.2)

## 2018-06-17 LAB — URINALYSIS, ROUTINE W REFLEX MICROSCOPIC
Bilirubin Urine: NEGATIVE
Glucose, UA: NEGATIVE mg/dL
Hgb urine dipstick: NEGATIVE
KETONES UR: NEGATIVE mg/dL
LEUKOCYTES UA: NEGATIVE
NITRITE: NEGATIVE
PH: 5.5 (ref 5.0–8.0)
PROTEIN: 30 mg/dL — AB
Specific Gravity, Urine: 1.025 (ref 1.005–1.030)

## 2018-06-17 LAB — URINALYSIS, MICROSCOPIC (REFLEX)

## 2018-06-17 LAB — I-STAT CG4 LACTIC ACID, ED: Lactic Acid, Venous: 1.43 mmol/L (ref 0.5–1.9)

## 2018-06-17 LAB — LIPASE, BLOOD: Lipase: 24 U/L (ref 11–51)

## 2018-06-17 MED ORDER — DEXTROSE 10 % IV SOLN
125.00 | INTRAVENOUS | Status: DC
Start: ? — End: 2018-06-17

## 2018-06-17 MED ORDER — HYDROMORPHONE HCL 1 MG/ML IJ SOLN
0.5000 mg | Freq: Once | INTRAMUSCULAR | Status: AC
  Administered 2018-06-17: 0.5 mg via INTRAVENOUS
  Filled 2018-06-17: qty 1

## 2018-06-17 MED ORDER — ONDANSETRON HCL 4 MG/2ML IJ SOLN
INTRAMUSCULAR | Status: AC
  Administered 2018-06-17: 4 mg
  Filled 2018-06-17: qty 2

## 2018-06-17 MED ORDER — SODIUM CHLORIDE 0.9 % IV BOLUS
1000.0000 mL | Freq: Once | INTRAVENOUS | Status: AC
  Administered 2018-06-17: 1000 mL via INTRAVENOUS

## 2018-06-17 MED ORDER — VANCOMYCIN HCL IN DEXTROSE 1-5 GM/200ML-% IV SOLN
1000.0000 mg | Freq: Once | INTRAVENOUS | Status: AC
  Administered 2018-06-17: 1000 mg via INTRAVENOUS
  Filled 2018-06-17: qty 200

## 2018-06-17 MED ORDER — PIPERACILLIN-TAZOBACTAM 3.375 G IVPB 30 MIN
3.3750 g | Freq: Once | INTRAVENOUS | Status: AC
  Administered 2018-06-17: 3.375 g via INTRAVENOUS
  Filled 2018-06-17 (×2): qty 50

## 2018-06-17 MED ORDER — INSULIN LISPRO 100 UNIT/ML ~~LOC~~ SOLN
2.00 | SUBCUTANEOUS | Status: DC
Start: ? — End: 2018-06-17

## 2018-06-17 NOTE — ED Notes (Signed)
Pt presents with pain to RUQ. Pt has follow up appt with nephrologist on Monday to determine treatment plan. Pt was supposed to be admitted to [facility] yesterday but opted to go home and follow up if needed.

## 2018-06-17 NOTE — ED Provider Notes (Signed)
[facility] EMERGENCY DEPARTMENT Provider Note   CSN: [id number] Arrival date & time: 06/17/18  1617     History   Chief Complaint Chief Complaint  Patient presents with  . Abdominal Pain    HPI Mariah [name] is a 62 y.o. female with a past medical history of polycystic kidney disease status post living donor transplant, A. fib, anemia, and hemorrhagic liver cyst who presents today for evaluation of continued right upper quadrant abdominal pain.  She was seen 2 days ago at [facility] where she was diagnosed with a hemorrhagic liver cyst.  They recommended admission however she chose to leave AMA.  She presents here with continued pain.  He denies any sudden worsening of her pain.  Does report that earlier today at home she had a fever with a temperature of 100.4.  She is immunosuppressed as part of her kidney transplant.  Chart review was performed including outside records for [facility].  HPI  Past Medical History:  Diagnosis Date  . Anemia   . Arthritis   . Atrial fibrillation ([place]) 2012  . Cancer ([place])    nose skin cancer- "not melanoma"  . Dysrhythmia    afib  . Gout   . History of kidney stones   . Hyperlipidemia   . Hypertension   . Pneumonia    03/08/17- many years ago  . Polycystic kidney disease 11/2016   transplant   . PONV (postoperative nausea and vomiting)   . Pre-diabetes    due to antirejection medication  . SDH (subdural hematoma) [facility])     Patient Active Problem List   Diagnosis Date Noted  . Nephrolithiasis 11/14/2017  . 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 Hx of motion sickness 10/17/2017  . Muscle spasm 10/17/2017  . Paroxysmal atrial fibrillation ([place]) 01/27/2017  . 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 History of kidney transplant 11/29/2016  . Immunosuppression ([place]) 11/29/2016  . Adenomatous polyp of colon 05/12/2016  . Poor venous access 10/28/2015  . Hypertension 06/16/2015    . Hyperlipidemia 06/16/2015  . Mitral regurgitation 11/20/2014  . CKD (chronic kidney disease) stage 4, GFR 15-29 ml/min (HCC) 11/04/2014  . Osteoarthritis of right knee 10/10/2014  . Iron deficiency anemia, unspecified 09/25/2014  . Tachycardia 09/11/2014  . Bilateral leg edema 09/11/2014  . Primary gout 09/11/2014  . Acute respiratory failure ([place]) 07/11/2014  . Absolute anemia 07/11/2014  . Anxiety 07/11/2014  . A-fib ([place]) 07/08/2014  . Injury of kidney 07/08/2014  . 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 Atrial fibrillation ([place]) 01/19/2013  . Palpitations 01/19/2013  . Kidney disease, chronic, stage III (GFR 30-59 ml/min) (HCC) 11/06/2012  . GAMMA GLUTAMYL TRANSFERASE, SERUM, ELEVATED 10/13/2010  . UNSPECIFIED VITAMIN D DEFICIENCY 10/09/2010  . Gout 10/09/2010  . Polycystic kidney 10/08/2008  . HYPERLIPIDEMIA 07/19/2006  . OBESITY, NOS 07/19/2006  . HYPERTENSION, BENIGN SYSTEMIC 07/19/2006  . KIDNEY STONE - NEPHROLITHIASIS 07/19/2006  . OSTEOARTHRITIS, MULTI SITES 07/19/2006    Past Surgical History:  Procedure Laterality Date  . BASCILIC VEIN TRANSPOSITION Left 09/07/2016   Procedure: BASILIC VEIN TRANSPOSITION LEFT UPPER ARM;  Surgeon: Elam Dutch, MD;  Location: [place];  Service: Vascular;  Laterality: Left;  . COLONOSCOPY  03/2004   repeat 10 years  . Evacuation of subdural hematoma  11/26/2000  . KIDNEY STONE SURGERY     Cysto  . KIDNEY TRANSPLANT     Nov 23, 2016  . LEFT ATRIAL  APPENDAGE OCCLUSION  06/03/2017   Dr. Remus Blake   . LIGATION OF ARTERIOVENOUS  FISTULA Left 03/09/2017   Procedure: LIGATION OF LEFT BASILIC VEIN TRANSPOSITION;  Surgeon: Elam Dutch, MD;  Location: [place];  Service: Vascular;  Laterality: Left;  . ORIF ELBOW FRACTURE  10/22/2011   Procedure: OPEN REDUCTION INTERNAL FIXATION (ORIF) ELBOW/OLECRANON FRACTURE;  Surgeon: Schuyler Amor, MD;  Location: [place];  Service: Orthopedics;  Laterality:  Left;  open reduction  possible lateral reconstruction with palmaris graft from left or right side.  [name] Kitchen RADIAL HEAD IMPLANT  09/24/2011   Procedure: RADIAL HEAD IMPLANT;  Surgeon: Schuyler Amor, MD;  Location: [place];  Service: Orthopedics;  Laterality: Left;  left radial head replacement  . TONSILLECTOMY       OB History    Gravida  0   Para  0   Term  0   Preterm  0   AB  0   Living  0     SAB  0   TAB  0   Ectopic  0   Multiple  0   Live Births  0            Home Medications    Prior to Admission medications   Medication Sig Start Date End Date Taking? Authorizing Provider  diazepam (VALIUM) 5 MG tablet As needed for anxiety due to flying. 10/17/17  Yes Breeback, [name] L, PA-C  ondansetron (ZOFRAN) 8 MG tablet Take 1 tablet (8 mg total) by mouth every 8 (eight) hours as needed for nausea or vomiting. 10/17/17  Yes Breeback, [name] L, PA-C  acetaminophen (TYLENOL) 500 MG tablet Take 1,000 mg by mouth every 6 (six) hours as needed for fever (pain).    [provider]  aspirin EC 81 MG tablet Take 81 mg by mouth daily.    [provider]  atorvastatin (LIPITOR) 40 MG tablet Take 40 mg by mouth every Monday, Wednesday, and Friday.     [provider]  cephALEXin (KEFLEX) 500 MG capsule Take 1 capsule (500 mg total) by mouth 3 (three) times daily for 10 days. 06/10/18 06/20/18  Kandra Nicolas, MD  cyclobenzaprine (FLEXERIL) 5 MG tablet TAKE 1 TABLET BY MOUTH AT BEDTIME 04/05/18   Breeback, [name] L, PA-C  diphenoxylate-atropine (LOMOTIL) 2.5-0.025 MG tablet Take 1 tablet by mouth 4 (four) times daily as needed for diarrhea or loose stools. 10/17/17   Breeback, [name] L, PA-C  loratadine (CLARITIN) 10 MG tablet Take 10 mg by mouth daily as needed (seasonal allergies). Daily with lunch    [provider]  metFORMIN (GLUCOPHAGE) 500 MG tablet Take 500 mg by mouth 2 (two) times daily with a meal.     [provider]    Multiple Vitamins-Minerals (CENTRUM ADULTS PO) Take by mouth.    [provider]  mycophenolate (MYFORTIC) 180 MG EC tablet TAKE 2 TABLETS BY MOUTH 2 TIMES DAILY. 04/19/17   [provider]  Probiotic Product (PROBIOTIC-10 PO) Take by mouth.    [provider]  ranitidine (ZANTAC) 150 MG tablet Take 150 mg by mouth. 07/12/17   [provider]  sotalol (BETAPACE) 80 MG tablet Take 80 mg by mouth. 06/05/17 06/05/18  [provider]  sulfamethoxazole-trimethoprim (BACTRIM,SEPTRA) 400-80 MG tablet Take 1 tablet by mouth 3 (three) times a week.     [provider]  tacrolimus (PROGRAF) 1 MG capsule Take 5 mg in the AM and 4 mg  in the PM by mouth daily 09/20/17   [provider]  venlafaxine XR (EFFEXOR-XR) 37.5 MG 24 hr capsule Take 1 capsule (37.5 mg total) by mouth every other day. 11/17/17   Megan Salon, MD    Family History Family History  Problem Relation Age of Onset  . Polycystic kidney disease Father   . Heart failure Father   . Heart disease Mother        Rheumatic fever    Social History Social History   Tobacco Use  . Smoking status: Former Smoker    Packs/day: 2.00    Years: 24.00    Pack years: 48.00    Types: Cigarettes    Last attempt to quit: 12/12/1996    Years since quitting: 21.5  . Smokeless tobacco: Never Used  Substance Use Topics  . Alcohol use: Not Currently    Alcohol/week: 2.0 standard drinks    Types: 2 Cans of beer per week  . Drug use: No     Allergies   Codeine; Morphine; Poison ivy extract; and Zovirax [acyclovir sodium]   Review of Systems Review of Systems  Constitutional: Positive for chills and fever.  Respiratory: Negative for chest tightness and shortness of breath.   Cardiovascular: Negative for chest pain.  Gastrointestinal: Positive for abdominal pain. Negative for diarrhea, nausea and vomiting.  Genitourinary: Negative for difficulty urinating, dysuria and hematuria.   Allergic/Immunologic: Positive for immunocompromised state.  All other systems reviewed and are negative.    Physical Exam Updated Vital Signs BP 124/73 (BP Location: Right Arm)   Pulse 73   Temp 99.4 F (37.4 C) (Rectal)   Resp 18   Ht 5\' 2"  (1.575 m)   Wt 81.7 kg   LMP 07/11/2009 (Approximate)   SpO2 95%   BMI 32.96 kg/m   Physical Exam  Constitutional: She appears well-developed and well-nourished. She appears ill. No distress.  HENT:  Head: Normocephalic and atraumatic.  Mouth/Throat: Oropharynx is clear and moist.  Eyes: Conjunctivae are normal.  Neck: Neck supple.  Cardiovascular: Normal rate and regular rhythm.  No murmur heard. Pulmonary/Chest: Effort normal and breath sounds normal. No respiratory distress.  Abdominal: Soft. She exhibits distension and mass. Bowel sounds are increased. There is generalized tenderness and tenderness in the right upper quadrant and epigastric area.  Musculoskeletal: She exhibits no edema.  Neurological: She is alert.  Skin: Skin is warm and dry.  Psychiatric: She has a normal mood and affect.  Nursing note and vitals reviewed.    ED Treatments / Results  Labs (all labs ordered are listed, but only abnormal results are displayed) Labs Reviewed  CBC WITH DIFFERENTIAL/PLATELET - Abnormal; Notable for the following components:      Result Value   WBC 11.1 (*)    RBC 3.42 (*)    Hemoglobin 8.9 (*)    HCT 28.0 (*)    Platelets 406 (*)    Neutro Abs 8.8 (*)    Monocytes Absolute 1.5 (*)    All other components within normal limits  COMPREHENSIVE METABOLIC PANEL - Abnormal; Notable for the following components:   Sodium 130 (*)    Chloride 96 (*)    Glucose, Bld 144 (*)    Creatinine, Ser 1.63 (*)    Calcium 8.5 (*)    Alkaline Phosphatase 257 (*)    GFR calc non Af Amer 33 (*)    GFR calc Af Amer 38 (*)    All other components within normal limits  URINALYSIS,  ROUTINE W REFLEX MICROSCOPIC - Abnormal; Notable for the  following components:   APPearance HAZY (*)    Protein, ur 30 (*)    All other components within normal limits  URINALYSIS, MICROSCOPIC (REFLEX) - Abnormal; Notable for the following components:   Bacteria, UA MANY (*)    All other components within normal limits  CULTURE, BLOOD (ROUTINE X 2)  CULTURE, BLOOD (ROUTINE X 2)  URINE CULTURE  LIPASE, BLOOD  I-STAT CG4 LACTIC ACID, ED    EKG None  Radiology US Abdomen Limited Ruq  Result Date: 06/17/2018 CLINICAL DATA:  Right upper quadrant pain and fever. History of polycystic kidney disease and liver cysts. EXAM: ULTRASOUND ABDOMEN LIMITED RIGHT UPPER QUADRANT COMPARISON:  Body CT 06/13/2015 FINDINGS: Gallbladder: No gallstones or wall thickening visualized. No sonographic Murphy sign noted by sonographer. Common bile duct: Diameter: 6 mm Liver: Within normal limits in parenchymal echogenicity. Portal vein is patent on color Doppler imaging with normal direction of blood flow towards the liver. 14.1 x 11.8 x 12.6 cm isoechoic heterogeneous circumscribed mass in the dome of the liver, which demonstrates no internal blood flow. Two other simple appearing cysts in the right lobe of the liver measure 5.2 and 4.9 cm in diameter. IMPRESSION: 14.1 cm probable hemorrhagic cyst in the dome of the liver. No perihepatic free fluid was demonstrated to suggest rupture. Please correlate to the outside CT scan from 06/15/2018 mentioned in the history. Other simple appearing liver cysts. No evidence of acute cholecystitis. Electronically Signed   By: Fidela Salisbury M.D.   On: 06/17/2018 19:43    Procedures Procedures (including critical care time) CRITICAL CARE Performed by: Wyn Quaker Total critical care time: 30 minutes Critical care time was exclusive of separately billable procedures and treating other patients. Critical care was necessary to treat or prevent imminent or life-threatening deterioration. Critical care was time spent personally  by me on the following activities: development of treatment plan with patient and/or surrogate as well as nursing, discussions with consultants, evaluation of patient's response to treatment, examination of patient, obtaining history from patient or surrogate, ordering and performing treatments and interventions, ordering and review of laboratory studies, ordering and review of radiographic studies, pulse oximetry and re-evaluation of patient's condition.   Medications Ordered in ED Medications  HYDROmorphone (DILAUDID) injection 0.5 mg (0.5 mg Intravenous Given 06/17/18 1748)  ondansetron (ZOFRAN) 4 MG/2ML injection (4 mg  Given 06/17/18 1747)  sodium chloride 0.9 % bolus 1,000 mL ( Intravenous Stopped 06/17/18 2156)  piperacillin-tazobactam (ZOSYN) IVPB 3.375 g ( Intravenous Stopped 06/17/18 1848)  vancomycin (VANCOCIN) IVPB 1000 mg/200 mL premix ( Intravenous Stopped 06/17/18 2008)  HYDROmorphone (DILAUDID) injection 0.5 mg (0.5 mg Intravenous Given 06/17/18 2005)  HYDROmorphone (DILAUDID) injection 0.5 mg (0.5 mg Intravenous Given 06/17/18 2252)     Initial Impression / Assessment and Plan / ED Course  I have reviewed the triage vital signs and the nursing notes.  Pertinent labs & imaging results that were available during my care of the patient were reviewed by me and considered in my medical decision making (see chart for details).  Clinical Course as of Jun 20 51  Sat Jun 17, 2018  2025 Spoke with Dr. Gloriann Loan From nephrology from [facility].  Will consult hospitalist as they don't have beds available.    [EH]  2029 Dr. Nonie Hoyer Hospitalist   [EH]    Clinical Course User Index [EH] Lorin Glass, PA-C   Presents today for evaluation of right upper quadrant abdominal  pain.  She has been seen for this and recently diagnosed with hemorrhagic liver cysts.  She reported that she had a fevers at home with a temp of 100.4.  She is immunosuppressed as she is status post renal transplant.  Given  fever in an immunosuppressed patient concern for sepsis.  Her white blood cell count is elevated at 11.1.  Cultures were obtained, and she was started on broad-spectrum antibiotics.  Lactic acid was not elevated.  Her urine was hazy with many bacteria and multiple different types present.  Chart was reviewed and outside records.  Her creatinine is elevated from her baseline of around 1-1.63 with a GFR reduction to 33.  Her hemoglobin has also decreased over the past few days from about 9.6-8.9 raising concern for continued bleeding with her hemorrhagic cyst.  Ultrasound was obtained which did not show a significant increased size.  As patient is immunosuppressed and febrile with an AKI and is status post renal transplant recommended admission.  Patient's primary nephrology and transplant teams are at [facility].  I spoke with Dr. Gloriann Loan from nephrology and Dr. Nonie Hoyer from hospitalist both at [facility], who agreed to admit the patient.  Her pain and symptoms of nausea were treated while in the emergency room.  This patient was seen as a shared visit with Dr. Darl Householder.  Patient transported to [facility] by [employer] [facility].  Final Clinical Impressions(s) / ED Diagnoses   Final diagnoses:  RUQ pain  Generalized abdominal pain    ED Discharge Orders    None       Lorin Glass, Hershal Coria 06/19/18 0056    Drenda Freeze, MD 06/21/18 [phone]

## 2018-06-17 NOTE — ED Triage Notes (Signed)
Pt c/o RUQ  Pain x 1 week. Has been evaluated at [facility] Thursday and Dx with large bleeding liver cyst that is encapsulated. Her Hgb has dropped slightly this week. She is here for pain management. She is a kidney transplant recipient

## 2018-06-19 LAB — URINE CULTURE: Culture: NO GROWTH

## 2018-06-19 MED ORDER — GENERIC EXTERNAL MEDICATION
0.50 | Status: DC
Start: ? — End: 2018-06-19

## 2018-06-19 MED ORDER — ONDANSETRON 4 MG PO TBDP
4.00 | ORAL_TABLET | ORAL | Status: DC
Start: ? — End: 2018-06-19

## 2018-06-19 MED ORDER — SULFAMETHOXAZOLE-TRIMETHOPRIM 400-80 MG PO TABS
1.00 | ORAL_TABLET | ORAL | Status: DC
Start: 2018-06-19 — End: 2018-06-19

## 2018-06-19 MED ORDER — ACETAMINOPHEN 325 MG PO TABS
650.00 | ORAL_TABLET | ORAL | Status: DC
Start: ? — End: 2018-06-19

## 2018-06-19 MED ORDER — SOTALOL HCL 80 MG PO TABS
80.00 | ORAL_TABLET | ORAL | Status: DC
Start: 2018-06-19 — End: 2018-06-19

## 2018-06-19 MED ORDER — ATORVASTATIN CALCIUM 40 MG PO TABS
40.00 | ORAL_TABLET | ORAL | Status: DC
Start: 2018-06-19 — End: 2018-06-19

## 2018-06-19 MED ORDER — FEXOFENADINE HCL 60 MG PO TABS
60.00 | ORAL_TABLET | ORAL | Status: DC
Start: 2018-06-19 — End: 2018-06-19

## 2018-06-19 MED ORDER — IRON PO
1.00 | ORAL | Status: DC
Start: 2018-06-19 — End: 2018-06-19

## 2018-06-19 MED ORDER — PNEUMOCOCCAL VAC POLYVALENT 25 MCG/0.5ML IJ INJ
0.50 | INJECTION | INTRAMUSCULAR | Status: DC
Start: ? — End: 2018-06-19

## 2018-06-19 MED ORDER — VENLAFAXINE HCL ER 37.5 MG PO CP24
37.50 | ORAL_CAPSULE | ORAL | Status: DC
Start: 2018-06-19 — End: 2018-06-19

## 2018-06-19 MED ORDER — INSULIN LISPRO 100 UNIT/ML ~~LOC~~ SOLN
2.00 | SUBCUTANEOUS | Status: DC
Start: 2018-06-19 — End: 2018-06-19

## 2018-06-19 MED ORDER — TACROLIMUS 5 MG PO CAPS
5.00 | ORAL_CAPSULE | ORAL | Status: DC
Start: 2018-06-19 — End: 2018-06-19

## 2018-06-19 MED ORDER — DEXTROSE 10 % IV SOLN
125.00 | INTRAVENOUS | Status: DC
Start: ? — End: 2018-06-19

## 2018-06-19 MED ORDER — RANITIDINE HCL 150 MG PO TABS
150.00 | ORAL_TABLET | ORAL | Status: DC
Start: 2018-06-19 — End: 2018-06-19

## 2018-06-19 MED ORDER — HYDROMORPHONE HCL 2 MG PO TABS
2.00 | ORAL_TABLET | ORAL | Status: DC
Start: ? — End: 2018-06-19

## 2018-06-19 MED ORDER — TACROLIMUS 1 MG PO CAPS
4.00 | ORAL_CAPSULE | ORAL | Status: DC
Start: 2018-06-19 — End: 2018-06-19

## 2018-06-19 MED ORDER — MYCOPHENOLATE SODIUM 360 MG PO TBEC
360.00 | DELAYED_RELEASE_TABLET | ORAL | Status: DC
Start: 2018-06-19 — End: 2018-06-19

## 2018-06-19 MED ORDER — POLYETHYLENE GLYCOL 3350 17 G PO PACK
17.00 g | PACK | ORAL | Status: DC
Start: ? — End: 2018-06-19

## 2018-06-19 MED ORDER — PANTOPRAZOLE SODIUM 40 MG PO TBEC
40.00 | DELAYED_RELEASE_TABLET | ORAL | Status: DC
Start: 2018-06-19 — End: 2018-06-19

## 2018-06-22 LAB — CULTURE, BLOOD (ROUTINE X 2)
CULTURE: NO GROWTH
Culture: NO GROWTH
SPECIAL REQUESTS: ADEQUATE

## 2018-07-04 ENCOUNTER — Inpatient Hospital Stay: Payer: Medicare Other | Admitting: Physician Assistant

## 2018-07-06 DIAGNOSIS — N170 Acute kidney failure with tubular necrosis: Secondary | ICD-10-CM | POA: Insufficient documentation

## 2018-07-13 ENCOUNTER — Ambulatory Visit (INDEPENDENT_AMBULATORY_CARE_PROVIDER_SITE_OTHER): Payer: Medicare Other | Admitting: Family Medicine

## 2018-07-13 DIAGNOSIS — Z23 Encounter for immunization: Secondary | ICD-10-CM | POA: Diagnosis not present

## 2018-07-13 NOTE — Progress Notes (Signed)
Patient presents to clinic for flu shot. She received an injection in left deltoid with no immediate complications. Patient denies any symptoms from previous injections.

## 2018-10-05 ENCOUNTER — Other Ambulatory Visit: Payer: Self-pay | Admitting: Physician Assistant

## 2018-10-05 DIAGNOSIS — M62838 Other muscle spasm: Secondary | ICD-10-CM

## 2018-10-24 ENCOUNTER — Ambulatory Visit (INDEPENDENT_AMBULATORY_CARE_PROVIDER_SITE_OTHER): Payer: Medicare Other | Admitting: Physician Assistant

## 2018-10-24 ENCOUNTER — Encounter: Payer: Self-pay | Admitting: Physician Assistant

## 2018-10-24 VITALS — BP 129/82 | HR 70 | Temp 98.5°F | Ht 62.0 in | Wt 178.0 lb

## 2018-10-24 DIAGNOSIS — Z940 Kidney transplant status: Secondary | ICD-10-CM

## 2018-10-24 DIAGNOSIS — J3489 Other specified disorders of nose and nasal sinuses: Secondary | ICD-10-CM

## 2018-10-24 DIAGNOSIS — R0981 Nasal congestion: Secondary | ICD-10-CM

## 2018-10-24 MED ORDER — FLUTICASONE PROPIONATE 50 MCG/ACT NA SUSP: 2.0000 | Freq: Every day | NASAL | 6 refills | Status: DC

## 2018-10-24 NOTE — Patient Instructions (Signed)
Use flonase 2 sprays each nostril daily.

## 2018-10-24 NOTE — Progress Notes (Signed)
Subjective:    Patient ID: Mariah [name], female    DOB: 01-01-1956, 63 y.o.   MRN: [id number]  HPI  Patient is a 63 year old female status post kidney transplant, hypertension, paroxysmal atrial fibrillation who presents to the clinic with 1 day of sneezing, rhinorrhea, right-sided sinus pressure.  She woke up with symptoms and sneeze all day long.  Her nose was running clear discharge.  She took a Benadryl last night.  She woke up feeling a lot better.  She did only blow her nose once this morning.  No fever, chills, body aches, shortness of breath, wheezing or cough.  She is being followed by nephrologist monthly with labs.  Important to note she recently had recurrent thrush after having so many antibiotics due to infections with her kidney.   .. Active Ambulatory Problems    Diagnosis Date Noted  . HYPERLIPIDEMIA 07/19/2006  . OBESITY, NOS 07/19/2006  . HYPERTENSION, BENIGN SYSTEMIC 07/19/2006  . KIDNEY STONE - NEPHROLITHIASIS 07/19/2006  . OSTEOARTHRITIS, MULTI SITES 07/19/2006  . Polycystic kidney 10/08/2008  . UNSPECIFIED VITAMIN D DEFICIENCY 10/09/2010  . Gout 10/09/2010  . GAMMA GLUTAMYL TRANSFERASE, SERUM, ELEVATED 10/13/2010  . Kidney disease, chronic, stage III (GFR 30-59 ml/min) (HCC) 11/06/2012  . Atrial fibrillation ([place]) 01/19/2013  . Palpitations 01/19/2013  . A-fib ([place]) 07/08/2014  . Injury of kidney 07/08/2014  . Acute respiratory failure ([place]) 07/11/2014  . 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 Absolute anemia 07/11/2014  . Anxiety 07/11/2014  . Tachycardia 09/11/2014  . Bilateral leg edema 09/11/2014  . Primary gout 09/11/2014  . Osteoarthritis of right knee 10/10/2014  . CKD (chronic kidney disease) stage 4, GFR 15-29 ml/min (HCC) 11/04/2014  . Mitral regurgitation 11/20/2014  . Hypertension 06/16/2015  . Hyperlipidemia 06/16/2015  . Poor venous access 10/28/2015  . Adenomatous polyp of colon 05/12/2016  . Paroxysmal atrial  fibrillation ([place]) 01/27/2017  . 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 Hx of motion sickness 10/17/2017  . Muscle spasm 10/17/2017  . Status post living-donor kidney transplantation 11/29/2016  . Immunosuppression ([place]) 11/29/2016  . Iron deficiency anemia, unspecified 09/25/2014  . Nephrolithiasis 11/14/2017  . 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 UTI 12/23/2008  . INFECTIOUS COLITIS ENTERITIS AND GASTROENTERITIS 10/09/2010  . Acute maxillary sinusitis 12/22/2010  . Abdominal pain 07/10/2014  . BP (high blood pressure) 07/08/2014  . AKI (acute kidney injury) ([place]) 06/16/2015  . Fever 06/16/2015  . Left flank pain 03/09/2016   Past Medical History:  Diagnosis Date  . Anemia   . Arthritis   . Cancer ([place])   . Dysrhythmia   . History of kidney stones   . Pneumonia   . Polycystic kidney disease 11/2016  . PONV (postoperative nausea and vomiting)   . Pre-diabetes   . SDH (subdural hematoma) (HCC)          Review of Systems See HPI.     Objective:   Physical Exam Vitals signs reviewed.  Constitutional:      Appearance: Normal appearance.  HENT:     Head: Normocephalic and atraumatic.     Comments: Right maxillary sinus tenderness to palpation.     Right Ear: Tympanic membrane and ear canal normal.     Left Ear: Tympanic membrane and ear canal normal.     Nose: Congestion and rhinorrhea present.     Mouth/Throat:     Mouth: Mucous  membranes are moist.     Pharynx: No posterior oropharyngeal erythema.  Cardiovascular:     Rate and Rhythm: Normal rate and regular rhythm.     Pulses: Normal pulses.     Heart sounds: Normal heart sounds.  Pulmonary:     Effort: Pulmonary effort is normal.     Breath sounds: Normal breath sounds.  Neurological:     General: No focal deficit present.     Mental Status: She is alert and oriented to person, place, and time.  Psychiatric:         Mood and Affect: Mood normal.        Behavior: Behavior normal.           Assessment & Plan:  [name] KitchenMarland KitchenPier was seen today for cough.  Diagnoses and all orders for this visit:  Sinus pressure -     fluticasone (FLONASE) 50 MCG/ACT nasal spray; Place 2 sprays into both nostrils daily.  Nasal congestion -     fluticasone (FLONASE) 50 MCG/ACT nasal spray; Place 2 sprays into both nostrils daily.  Status post living-donor kidney transplantation   Reassured patient I do not see any signs of bacterial infection today on exam.  I think she is okay to continue with Benadryl at night.  I added Flonase to help with her nasal congestion and sinus pressure.  Discussed how to use Flonase appropriately.  Strongly encouraged to start zinc and vitamin C to help immunity support her.  Rest and hydrate.  Follow-up with any new or worsening symptoms.  Patient was also educated we need to be very cautious when to give antibiotics since she had the recent thrush that lasted for weeks.

## 2018-11-07 ENCOUNTER — Other Ambulatory Visit: Payer: Self-pay | Admitting: Osteopathic Medicine

## 2018-11-07 DIAGNOSIS — M62838 Other muscle spasm: Secondary | ICD-10-CM

## 2018-11-12 LAB — HM DIABETES FOOT EXAM: HM Diabetic Foot Exam: NORMAL

## 2018-11-20 ENCOUNTER — Other Ambulatory Visit (HOSPITAL_COMMUNITY)
Admission: RE | Admit: 2018-11-20 | Discharge: 2018-11-20 | Disposition: A | Discharge status: Home / Self Care | Payer: Medicare Other | Source: Ambulatory Visit | Attending: Obstetrics & Gynecology | Admitting: Obstetrics & Gynecology

## 2018-11-20 ENCOUNTER — Ambulatory Visit (INDEPENDENT_AMBULATORY_CARE_PROVIDER_SITE_OTHER): Payer: Medicare Other | Admitting: Obstetrics & Gynecology

## 2018-11-20 ENCOUNTER — Other Ambulatory Visit: Payer: Self-pay

## 2018-11-20 ENCOUNTER — Encounter: Payer: Self-pay | Admitting: Obstetrics & Gynecology

## 2018-11-20 VITALS — BP 138/88 | HR 72 | Resp 16 | Ht 61.75 in | Wt 178.0 lb

## 2018-11-20 DIAGNOSIS — Z124 Encounter for screening for malignant neoplasm of cervix: Secondary | ICD-10-CM | POA: Diagnosis not present

## 2018-11-20 DIAGNOSIS — M62838 Other muscle spasm: Secondary | ICD-10-CM | POA: Diagnosis not present

## 2018-11-20 DIAGNOSIS — Z01419 Encounter for gynecological examination (general) (routine) without abnormal findings: Secondary | ICD-10-CM | POA: Diagnosis not present

## 2018-11-20 MED ORDER — VENLAFAXINE HCL ER 37.5 MG PO CP24: 37.5000 mg | ORAL_CAPSULE | ORAL | 4 refills | Status: DC

## 2018-11-20 MED ORDER — CYCLOBENZAPRINE HCL 5 MG PO TABS: 5.0000 mg | ORAL_TABLET | Freq: Every day | ORAL | 4 refills | Status: DC

## 2018-11-20 NOTE — Progress Notes (Signed)
63 y.o. G0P0000 Married White or Caucasian female here for annual exam.  Doing well.    H/o polycystic kidney s/p renal transplant.  She does have liver cysts.  In September, a 14cm liver cyst had internal hemorrhage.  This was monitored closely.    Patient's last menstrual period was 07/11/2009 (approximate).          Sexually active: Yes.    The current method of family planning is post menopausal status.    Exercising: No.   Smoker:  no  Health Maintenance: Pap:  11/14/17 Neg   09/08/18 Neg. HR HPV:neg  History of abnormal Pap:  no MMG:  11/10/17 BIRADS1:Neg  Colonoscopy:  08/04/15 f/u 5 years.  Tubular adenomas.   BMD:   2012 normal  TDaP:  07/27/2011 Pneumonia vaccine(s):  2019 Shingrix:  12/26/12 Hep C testing: 09/16/15 neg  Screening Labs: PCP   reports that she quit smoking about 21 years ago. Her smoking use included cigarettes. She has a 48.00 pack-year smoking history. She has never used smokeless tobacco. She reports previous alcohol use of about 2.0 standard drinks of alcohol per week. She reports that she does not use drugs.  Past Medical History:  Diagnosis Date  . Anemia   . Arthritis   . ATN (acute tubular necrosis) ([place])   . Atrial fibrillation ([place]) 2012  . Cancer ([place])    nose skin cancer- "not melanoma"  . Dysrhythmia    afib  . Gout   . History of kidney stones   . Hyperlipidemia   . Hypertension   . Kidney transplant recipient   . Liver cyst   . Nephrolithiasis   . 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 Pneumonia    03/08/17- many years ago  . Polycystic kidney disease 11/2016   transplant   . PONV (postoperative nausea and vomiting)   . 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 Pre-diabetes    due to antirejection medication  . Ruptured cyst of kidney   . SDH (subdural hematoma) ([facility])     Past Surgical History:  Procedure Laterality Date  . BASCILIC VEIN TRANSPOSITION Left 09/07/2016   Procedure: BASILIC  VEIN TRANSPOSITION LEFT UPPER ARM;  Surgeon: Elam Dutch, MD;  Location: [facility];  Service: Vascular;  Laterality: Left;  . COLONOSCOPY  03/2004   repeat 10 years  . Evacuation of subdural hematoma  11/26/2000  . KIDNEY STONE SURGERY     Cysto  . KIDNEY TRANSPLANT     Nov 23, 2016  . LEFT ATRIAL APPENDAGE OCCLUSION  06/03/2017   Dr. Remus Blake   . LIGATION OF ARTERIOVENOUS  FISTULA Left 03/09/2017   Procedure: LIGATION OF LEFT BASILIC VEIN TRANSPOSITION;  Surgeon: Elam Dutch, MD;  Location: [facility];  Service: Vascular;  Laterality: Left;  . ORIF ELBOW FRACTURE  10/22/2011   Procedure: OPEN REDUCTION INTERNAL FIXATION (ORIF) ELBOW/OLECRANON FRACTURE;  Surgeon: Schuyler Amor, MD;  Location: [facility];  Service: Orthopedics;  Laterality: Left;  open reduction  possible lateral reconstruction with palmaris graft from left or right side.  [name] Kitchen RADIAL HEAD IMPLANT  09/24/2011   Procedure: RADIAL HEAD IMPLANT;  Surgeon: Schuyler Amor, MD;  Location: [facility];  Service: Orthopedics;  Laterality: Left;  left radial head replacement  . TONSILLECTOMY      Current Outpatient Medications  Medication Sig Dispense Refill  . acetaminophen (TYLENOL) 500 MG tablet Take 1,000 mg by mouth every 6 (six)  hours as needed for fever (pain).    [name] Kitchen aspirin EC 81 MG tablet Take 81 mg by mouth daily.    [name] Kitchen atorvastatin (LIPITOR) 40 MG tablet Take 40 mg by mouth every Monday, Wednesday, and Friday.     . Cyanocobalamin (B-12) 5000 MCG SUBL daily.    . cyclobenzaprine (FLEXERIL) 5 MG tablet Take 1 tablet (5 mg total) by mouth at bedtime. Due for routine visit prior to more refills 30 tablet 0  . famotidine (PEPCID AC MAXIMUM STRENGTH) 20 MG tablet daily.    [name] Kitchen loratadine (CLARITIN) 10 MG tablet Take 10 mg by mouth daily as needed (seasonal allergies). Daily with lunch    . metFORMIN (GLUCOPHAGE) 500 MG tablet Take 500 mg by mouth 2 (two) times daily with a meal.     . Multiple  Vitamins-Minerals (CENTRUM ADULTS PO) Take by mouth.    . mycophenolate (MYFORTIC) 180 MG EC tablet TAKE 2 TABLETS BY MOUTH 2 TIMES DAILY.    . Probiotic Product (PROBIOTIC-10 PO) Take by mouth.    . sulfamethoxazole-trimethoprim (BACTRIM,SEPTRA) 400-80 MG tablet Take 1 tablet by mouth 3 (three) times a week.     . tacrolimus (PROGRAF) 1 MG capsule Take 5 mg in the AM and 4 mg in the PM by mouth daily    . venlafaxine XR (EFFEXOR-XR) 37.5 MG 24 hr capsule Take 1 capsule (37.5 mg total) by mouth every other day. 90 capsule 4  . HYDROmorphone (DILAUDID) 2 MG tablet TAKE 1 TABLET BY MOUTH EVERY 6 HOURS AS NEEDED FOR UP TO 5 DAYS FOR MODERATE OR SEVERE PAIN.  0  . ondansetron (ZOFRAN) 4 MG tablet TAKE 1 TABLET BY MOUTH EVERY 8 HOURS AS NEEDED FOR NAUSEA AND VOMITING    . sotalol (BETAPACE) 80 MG tablet Take 80 mg by mouth.     No current facility-administered medications for this visit.     Family History  Problem Relation Age of Onset  . Polycystic kidney disease Father   . Heart failure Father   . Heart disease Mother        Rheumatic fever    Review of Systems  All other systems reviewed and are negative.   Exam:   BP 138/88 (BP Location: Right Arm, Patient Position: Sitting, Cuff Size: Large)   Pulse 72   Resp 16   Ht 5' 1.75" (1.568 m)   Wt 178 lb (80.7 kg)   LMP 07/11/2009 (Approximate)   BMI 32.82 kg/m    Height: 5' 1.75" (156.8 cm)  Ht Readings from Last 3 Encounters:  11/20/18 5' 1.75" (1.568 m)  10/24/18 5\' 2"  (1.575 m)  06/17/18 5\' 2"  (1.575 m)    General appearance: alert, cooperative and appears stated age Head: Normocephalic, without obvious abnormality, atraumatic Neck: no adenopathy, supple, symmetrical, trachea midline and thyroid normal to inspection and palpation Lungs: clear to auscultation bilaterally Breasts: normal appearance, no masses or tenderness Heart: regular rate and rhythm Abdomen: soft, non-tender; bowel sounds normal; no masses,  no  organomegaly Extremities: extremities normal, atraumatic, no cyanosis or edema Skin: Skin color, texture, turgor normal. No rashes or lesions Lymph nodes: Cervical, supraclavicular, and axillary nodes normal. No abnormal inguinal nodes palpated Neurologic: Grossly normal   Pelvic: External genitalia:  no lesions              Urethra:  normal appearing urethra with no masses, tenderness or lesions              Bartholins and Skenes:  normal                 Vagina: normal appearing vagina with normal color and discharge, no lesions              Cervix: no lesions              Pap taken: Yes.   Bimanual Exam:  Uterus:  normal size, contour, position, consistency, mobility, non-tender              Adnexa: normal adnexa and no mass, fullness, tenderness               Rectovaginal: Confirms               Anus:  normal sphincter tone, no lesions  Chaperone was present for exam.  A:  Well Woman with normal exam PMP, no HRT H/o living donor renal transplant (h/o PCKD) Fibromyalgia H/o situation depression and anxiety H/o SVT and afib  P:   Mammogram guidelines reviewed.  Doing 3D. pap smear obtained today Lab work and vaccines are UTD RF for Venlafaxine 37.5 every other day.  #45/4RF RF for flexeril 5mg  qhs prn.  #90/4RF Return annually or prn

## 2018-11-21 LAB — CYTOLOGY - PAP: DIAGNOSIS: NEGATIVE

## 2018-11-23 ENCOUNTER — Other Ambulatory Visit: Payer: Self-pay | Admitting: Physician Assistant

## 2018-11-23 DIAGNOSIS — Z1239 Encounter for other screening for malignant neoplasm of breast: Secondary | ICD-10-CM

## 2018-11-30 ENCOUNTER — Ambulatory Visit: Payer: Medicare Other

## 2018-12-07 ENCOUNTER — Ambulatory Visit (INDEPENDENT_AMBULATORY_CARE_PROVIDER_SITE_OTHER): Payer: Medicare Other

## 2018-12-07 DIAGNOSIS — Z1231 Encounter for screening mammogram for malignant neoplasm of breast: Secondary | ICD-10-CM | POA: Diagnosis not present

## 2018-12-07 DIAGNOSIS — Z1239 Encounter for other screening for malignant neoplasm of breast: Secondary | ICD-10-CM

## 2018-12-08 NOTE — Progress Notes (Signed)
Call pt: normal mammogram. Follow up in 61yr.

## 2019-03-13 ENCOUNTER — Encounter: Payer: Self-pay | Admitting: Physician Assistant

## 2019-05-09 ENCOUNTER — Ambulatory Visit: Payer: Medicare Other | Admitting: Physician Assistant

## 2019-05-31 LAB — HM DIABETES EYE EXAM

## 2019-06-04 ENCOUNTER — Ambulatory Visit (INDEPENDENT_AMBULATORY_CARE_PROVIDER_SITE_OTHER): Payer: Medicare Other | Admitting: Physician Assistant

## 2019-06-04 ENCOUNTER — Other Ambulatory Visit: Payer: Self-pay

## 2019-06-04 DIAGNOSIS — Z23 Encounter for immunization: Secondary | ICD-10-CM

## 2019-06-14 ENCOUNTER — Encounter: Payer: Self-pay | Admitting: Physician Assistant

## 2019-07-27 ENCOUNTER — Encounter: Payer: Self-pay | Admitting: Obstetrics & Gynecology

## 2019-09-04 ENCOUNTER — Encounter: Payer: Self-pay | Admitting: Obstetrics & Gynecology

## 2019-12-18 ENCOUNTER — Telehealth (INDEPENDENT_AMBULATORY_CARE_PROVIDER_SITE_OTHER): Payer: Medicare Other | Admitting: Physician Assistant

## 2019-12-18 ENCOUNTER — Telehealth: Payer: Medicare Other | Admitting: Physician Assistant

## 2019-12-18 ENCOUNTER — Encounter: Payer: Self-pay | Admitting: Physician Assistant

## 2019-12-18 VITALS — BP 124/64 | HR 68 | Temp 97.4°F | Ht 61.75 in | Wt 181.0 lb

## 2019-12-18 DIAGNOSIS — R059 Cough, unspecified: Secondary | ICD-10-CM

## 2019-12-18 DIAGNOSIS — J329 Chronic sinusitis, unspecified: Secondary | ICD-10-CM

## 2019-12-18 DIAGNOSIS — J309 Allergic rhinitis, unspecified: Secondary | ICD-10-CM | POA: Diagnosis not present

## 2019-12-18 DIAGNOSIS — R05 Cough: Secondary | ICD-10-CM | POA: Diagnosis not present

## 2019-12-18 DIAGNOSIS — J302 Other seasonal allergic rhinitis: Secondary | ICD-10-CM

## 2019-12-18 MED ORDER — METHYLPREDNISOLONE 4 MG PO TBPK: ORAL_TABLET | ORAL | 0 refills | Status: DC

## 2019-12-18 MED ORDER — FLUTICASONE PROPIONATE 50 MCG/ACT NA SUSP: 2.0000 | Freq: Every day | NASAL | 0 refills | Status: DC

## 2019-12-18 MED ORDER — AZITHROMYCIN 250 MG PO TABS: ORAL_TABLET | ORAL | 0 refills | Status: DC

## 2019-12-18 MED ORDER — HYDROCODONE-HOMATROPINE 5-1.5 MG/5ML PO SYRP: 5.0000 mL | ORAL_SOLUTION | Freq: Three times a day (TID) | ORAL | 0 refills | Status: DC | PRN

## 2019-12-18 NOTE — Progress Notes (Signed)
Patient ID: Mariah [name], female   DOB: 12-19-1955, 64 y.o.   MRN: [id number] .[name] KitchenVirtual Visit via Video Note  I connected with Mariah [name] on 12/18/19 at  9:30 AM EST by a video enabled telemedicine application and verified that I am speaking with the correct person using two identifiers.  Location: Patient: home Provider: clinic   I discussed the limitations of evaluation and management by telemedicine and the availability of in person appointments. The patient expressed understanding and agreed to proceed.  History of Present Illness: Pt is a 64 yo female with status post kidney transplant, with history of seasonal allergies and sinusitis who calls into the clinic with 2 days of sinus pressure, cough, facial pain, sneezing, headache, sinus drainage, sore throat. No fever, chills, body aches, loss of smell or taste, GI symptoms. She got her covid vaccine on Friday with Mariah [name] and Mariah [name]. She was a little achy after vaccine but then felt fine. She is taking tylenol, benadryl, coriciden with little help. She does admit that the day before she started feeling bad she cleaned the house/spring cleaning style. No SOB or wheezing.   .. Active Ambulatory Problems    Diagnosis Date Noted  . HYPERLIPIDEMIA 07/19/2006  . OBESITY, NOS 07/19/2006  . HYPERTENSION, BENIGN SYSTEMIC 07/19/2006  . KIDNEY STONE - NEPHROLITHIASIS 07/19/2006  . OSTEOARTHRITIS, MULTI SITES 07/19/2006  . Polycystic kidney 10/08/2008  . UNSPECIFIED VITAMIN D DEFICIENCY 10/09/2010  . Gout 10/09/2010  . GAMMA GLUTAMYL TRANSFERASE, SERUM, ELEVATED 10/13/2010  . Kidney disease, chronic, stage III (GFR 30-59 ml/min) 11/06/2012  . Atrial fibrillation ([place]) 01/19/2013  . Palpitations 01/19/2013  . A-fib ([place]) 07/08/2014  . Injury of kidney 07/08/2014  . Acute respiratory failure ([place]) 07/11/2014  . 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 Absolute anemia 07/11/2014  . Anxiety 07/11/2014  . Tachycardia  09/11/2014  . Bilateral leg edema 09/11/2014  . Primary gout 09/11/2014  . Osteoarthritis of right knee 10/10/2014  . CKD (chronic kidney disease) stage 4, GFR 15-29 ml/min ([facility]) 11/04/2014  . Mitral regurgitation 11/20/2014  . Hypertension 06/16/2015  . Hyperlipidemia 06/16/2015  . Poor venous access 10/28/2015  . Adenomatous polyp of colon 05/12/2016  . Paroxysmal atrial fibrillation ([place]) 01/27/2017  . 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 Hx of motion sickness 10/17/2017  . Muscle spasm 10/17/2017  . Status post living-donor kidney transplantation 11/29/2016  . Immunosuppression ([place]) 11/29/2016  . Iron deficiency anemia, unspecified 09/25/2014  . Nephrolithiasis 11/14/2017  . 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 ATN (acute tubular necrosis) ([place]) 07/06/2018  . Liver cyst 06/15/2018  . Presence of Watchman left atrial appendage closure device 12/07/2017   Resolved Ambulatory Problems    Diagnosis Date Noted  . UTI 12/23/2008  . INFECTIOUS COLITIS ENTERITIS AND GASTROENTERITIS 10/09/2010  . Acute maxillary sinusitis 12/22/2010  . Abdominal pain 07/10/2014  . BP (high blood pressure) 07/08/2014  . AKI (acute kidney injury) ([place]) 06/16/2015  . Fever 06/16/2015  . Left flank pain 03/09/2016   Past Medical History:  Diagnosis Date  . Anemia   . Arthritis   . Cancer ([place])   . Dysrhythmia   . History of kidney stones   . Kidney transplant recipient   . Pneumonia   . Polycystic kidney disease 11/2016  . PONV (postoperative nausea and vomiting)   . 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 Pre-diabetes   . Ruptured cyst of kidney   . SDH (subdural hematoma) ([facility])  Reviewed med, allergy, problem list.      Observations/Objective: No acute distress Normal breathing.  Dry cough on video. Normal appearance and mood.   .. Today's Vitals   12/18/19 0904  BP: 124/64  Pulse: 68  Temp: (!) 97.4 F (36.3 C)  TempSrc: Oral   Weight: 181 lb (82.1 kg)  Height: 5' 1.75" (1.568 m)   Body mass index is 33.37 kg/m.    Assessment and Plan: [name] KitchenMarland KitchenLudivina was seen today for sore throat and nasal congestion.  Diagnoses and all orders for this visit:  Allergic sinusitis -     azithromycin (ZITHROMAX) 250 MG tablet; Take 2 tablets now and then one tablet for 4 days. -     methylPREDNISolone (MEDROL DOSEPAK) 4 MG TBPK tablet; Take as directed by package insert.  Cough -     methylPREDNISolone (MEDROL DOSEPAK) 4 MG TBPK tablet; Take as directed by package insert. -     HYDROcodone-homatropine (HYCODAN) 5-1.5 MG/5ML syrup; Take 5 mLs by mouth every 8 (eight) hours as needed for cough.  Seasonal allergies -     methylPREDNISolone (MEDROL DOSEPAK) 4 MG TBPK tablet; Take as directed by package insert.   Symptoms sound like allergic sinusitis symptoms. Discussed with patient NOT bacterial at this time. Less likely covid and has no other covid symptoms. She does have a hx of needing an abx because mucus does not clear. Sent zpak to hold until 5-7 days with no improvement. Start medrol dose pack and flonase. Continue claritin daily. Consider neil med sinus rinses. Rest and hydrate. Hycodan for cough at bedtime. Follow up as needed or with new symptoms.   [name] KitchenMarland KitchenPDMP reviewed during this encounter.   Follow Up Instructions:    I discussed the assessment and treatment plan with the patient. The patient was provided an opportunity to ask questions and all were answered. The patient agreed with the plan and demonstrated an understanding of the instructions.   The patient was advised to call back or seek an in-person evaluation if the symptoms worsen or if the condition fails to improve as anticipated.  I provided 15 minutes of non-face-to-face time during this encounter.   Iran Planas, PA-C

## 2019-12-18 NOTE — Progress Notes (Signed)
Started yesterday morning: Lots of drainage Sore throat Head pressure  No other symptoms  Had Covid vaccine ([employer] on Friday)  Has taken tylenol/benadryl/OTC cold meds

## 2019-12-18 NOTE — Progress Notes (Signed)
E visit for Allergic Rhinitis We are sorry that you are not feeling well.  Here is how we plan to help!  Based on what you have shared with me it looks like you have Allergic Rhinitis.  Rhinitis is when a reaction occurs that causes nasal congestion, runny nose, sneezing, and itching.  It can also cause coughing when the mucous runs down the back of your throat.    Most types of rhinitis are caused by an inflammation and are associated with symptoms in the eyes ears or throat.  There are several types of rhinitis.  The most common are acute rhinitis, which is usually caused by a viral illness, allergic or seasonal rhinitis, and nonallergic or year-round rhinitis.  Nasal allergies occur certain times of the year.  Allergic rhinitis is caused when allergens in the air trigger the release of histamine in the body.  Histamine causes itching, swelling, and fluid to build up in the fragile linings of the nasal passages, sinuses and eyelids.  An itchy nose and clear discharge are common.  I recommend the following over the counter treatments: You should take a daily dose of antihistamine.  I see that you have Claritin on your medication list.  You may take this daily or use Zyrtec 1-2x per day.  I also would recommend a nasal spray: Flonase 2 sprays into each nostril once daily.  I have sent this prescription to your pharmacy.   OTC cough medication like Delsym or robitussin is also OK to take. Please be careful with decongestants as this can raise your blood pressure.  At this time there is no clinical indication of a bacterial infection and I would not start you on an antibiotic.  This is less likely a vaccine reaction.  If you have worsening symptoms, you will need COVID testing as you do not have full vaccine immunity until 2 weeks after your vaccine dose.    HOME CARE:   You can use an over-the-counter saline nasal spray as needed  Avoid areas where there is heavy dust, mites, or molds  Stay  indoors on windy days during the pollen season  Keep windows closed in home, at least in bedroom; use air conditioner.  Use high-efficiency house air filter  Keep windows closed in car, turn AC on re-circulate  Avoid playing out with dog during pollen season  GET HELP RIGHT AWAY IF:   If your symptoms do not improve within 10 days  You become short of breath  You develop yellow or green discharge from your nose for over 3 days  You have coughing fits  MAKE SURE YOU:   Understand these instructions  Will watch your condition  Will get help right away if you are not doing well or get worse  Thank you for choosing an e-visit. Your e-visit answers were reviewed by a board certified advanced clinical practitioner to complete your personal care plan. Depending upon the condition, your plan could have included both over the counter or prescription medications. Please review your pharmacy choice. Be sure that the pharmacy you have chosen is open so that you can pick up your prescription now.  If there is a problem you may message your provider in [place] to have the prescription routed to another pharmacy. Your safety is important to [place]. If you have drug allergies check your prescription carefully.  For the next 24 hours, you can use MyChart to ask questions about today's visit, request a non-urgent call back, or ask  for a work or school excuse from your e-visit provider. You will get an email in the next two days asking about your experience. I hope that your e-visit has been valuable and will speed your recovery.   Greater than 5 minutes, yet less than 10 minutes of time have been spent researching, coordinating, and implementing care for this patient today

## 2020-01-14 ENCOUNTER — Encounter: Payer: Self-pay | Admitting: Physician Assistant

## 2020-01-14 ENCOUNTER — Other Ambulatory Visit: Payer: Self-pay | Admitting: Obstetrics & Gynecology

## 2020-01-14 DIAGNOSIS — Z1231 Encounter for screening mammogram for malignant neoplasm of breast: Secondary | ICD-10-CM

## 2020-01-17 ENCOUNTER — Other Ambulatory Visit: Payer: Self-pay

## 2020-01-17 ENCOUNTER — Ambulatory Visit (INDEPENDENT_AMBULATORY_CARE_PROVIDER_SITE_OTHER): Payer: Medicare Other

## 2020-01-17 DIAGNOSIS — Z1231 Encounter for screening mammogram for malignant neoplasm of breast: Secondary | ICD-10-CM

## 2020-01-21 ENCOUNTER — Encounter: Payer: Self-pay | Admitting: Obstetrics & Gynecology

## 2020-01-22 ENCOUNTER — Other Ambulatory Visit: Payer: Self-pay | Admitting: *Deleted

## 2020-01-22 ENCOUNTER — Other Ambulatory Visit: Payer: Self-pay | Admitting: Obstetrics & Gynecology

## 2020-01-22 DIAGNOSIS — M62838 Other muscle spasm: Secondary | ICD-10-CM

## 2020-01-22 NOTE — Telephone Encounter (Signed)
Medication refill request: Effexor Last AEX: 11/20/2018 Next AEX: 04/04/2020, MM Last MMG (if hormonal medication request): n/a Refill authorized: #[id number], 0RFpended.   Pe[place] advise and refill if appropriate.

## 2020-01-22 NOTE — Telephone Encounter (Signed)
[facility]  Phone Number: [phone]  [facility] [address] Sabra Heck this is Mariah [name] 1955/10/14 I hope you are doing well. I am going to need medication refills before my appointment was rescheduled to in June. I know flexaril said no refills until after I see you , but I know you more than likely have no idea when I am coming in. My Effexor my be the same way I calling it in today. Thank you for everything and I will see you June 25th.

## 2020-01-23 NOTE — Telephone Encounter (Signed)
Medication refill request: Flexeril and Effexor Last AEX:  11/20/18 SM Next AEX: 04/04/20 Last MMG (if hormonal medication request): 01/17/20 BIRADS 1 negative/density b Refill authorized: Please advise on refills; orders pended for a 30-day supply with 0 refills

## 2020-01-25 MED ORDER — VENLAFAXINE HCL ER 37.5 MG PO CP24: ORAL_CAPSULE | ORAL | 0 refills | Status: DC

## 2020-01-25 MED ORDER — CYCLOBENZAPRINE HCL 5 MG PO TABS: 5.0000 mg | ORAL_TABLET | Freq: Every evening | ORAL | 0 refills | Status: DC | PRN

## 2020-02-07 ENCOUNTER — Other Ambulatory Visit: Payer: Self-pay

## 2020-02-07 ENCOUNTER — Ambulatory Visit (INDEPENDENT_AMBULATORY_CARE_PROVIDER_SITE_OTHER): Payer: Medicare Other | Admitting: Sports Medicine

## 2020-02-07 DIAGNOSIS — M1711 Unilateral primary osteoarthritis, right knee: Secondary | ICD-10-CM

## 2020-02-07 NOTE — Assessment & Plan Note (Signed)
Mariah [name] returns, she is a very pleasant 65 year old female, we last performed an aspiration and injection in 2015. She is now having recurrence of pain, right knee, moderate, persistent, today I performed another aspiration and injection, if this fails we will proceed with x-rays and Orthovisc approval. Return to see me in a month.

## 2020-02-07 NOTE — Progress Notes (Signed)
    Procedures performed today:    Procedure: Real-time Ultrasound Guided aspiration/injection of right knee Device: Samsung HS60  Verbal informed consent obtained.  Time-out conducted.  Noted no overlying erythema, induration, or other signs of local infection.  Skin prepped in a sterile fashion.  Local anesthesia: Topical Ethyl chloride.  With sterile technique and under real time ultrasound guidance:  Using an 18-gauge needle aspirated 24 cc of clear, straw-colored fluid, syringe switched and 1 cc Kenalog 40, 2 cc lidocaine, 2 cc bupivacaine injected easily Completed without difficulty  Pain immediately resolved suggesting accurate placement of the medication.  Advised to call if fevers/chills, erythema, induration, drainage, or persistent bleeding.  Images permanently stored and available for review in the ultrasound unit.  Impression: Technically successful ultrasound guided injection.  Independent interpretation of notes and tests performed by another provider:   None.  Brief History, Exam, Impression, and Recommendations:    Osteoarthritis of right knee Mariah [name] returns, she is a very pleasant 64 year old female, we last performed an aspiration and injection in 2015. She is now having recurrence of pain, right knee, moderate, persistent, today I performed another aspiration and injection, if this fails we will proceed with x-rays and Orthovisc approval. Return to see me in a month.    ___________________________________________ Gwen Her. Dianah Field, M.D., ABFM., CAQSM. Primary Care and Conception Instructor of [employer] [facility] [employer] [facility]

## 2020-03-24 ENCOUNTER — Encounter: Payer: Self-pay | Admitting: Obstetrics & Gynecology

## 2020-03-24 ENCOUNTER — Telehealth: Payer: Self-pay

## 2020-03-24 NOTE — Telephone Encounter (Signed)
Spoke with pt. Pt states partner Pam had knee surgery in may and removed kneecap and is in brace from upper thigh to ankle and cannot bend at the knee. Has to keep leg straight.  Pt wanting to know if she needs to keep appt for AEX on 6/25. Pt verbalized partner is able to come and wants to keep appt. Pt advised partner to be placed in procedure room for AEX due to brace. Partner Pam agreeable.  Pt appt 6/25 at 1 pm. Partner's appt 6/25 at 130 pm.   Routing to Dr Sabra Heck for review.  Cc: [facility]-  partner to be placed in procedure room. States does not need wheelchair.

## 2020-03-24 NOTE — Telephone Encounter (Signed)
Den[facility], [facility] Good morning, we have an appointment on June 25th with you. It is Mariah [name]'s every other year, she had her left kneecap removed in late May. She is in a brace from her upper thigh to her ankle and can't bend her knee at all yet. Should we move her appointment or should she still come with me?

## 2020-03-26 ENCOUNTER — Encounter: Payer: Self-pay | Admitting: Physician Assistant

## 2020-04-03 ENCOUNTER — Other Ambulatory Visit: Payer: Self-pay | Admitting: Obstetrics & Gynecology

## 2020-04-03 DIAGNOSIS — M62838 Other muscle spasm: Secondary | ICD-10-CM

## 2020-04-03 NOTE — Progress Notes (Signed)
64 y.o. G0P0000 Married White or Caucasian female here for annual exam.  Doing well.  Kidney function is good.  Most recent creatinine was 0.98 on 02/15/2020.    Denies vaginal bleeding.    Dr. Armstead Peaks  Patient's last menstrual period was 07/11/2009 (approximate).          Sexually active: Yes.    The current method of family planning is post menopausal status.    Exercising: No.  exercise Smoker:  no  Health Maintenance: Pap:  11-14-17 neg, 11-20-2018 neg History of abnormal Pap:  no MMG:  01-17-2020 category b density birads 1:neg Colonoscopy:  08/04/15 f/u 75yrs tubular adenomas.  Due later this year.   BMD:   2012 normal TDaP:  2012 Pneumonia vaccine(s):  2019 Shingrix:  Zoster  2014 Hep C testing: 2016 neg Screening Labs: 02/2020   reports that she quit smoking about 23 years ago. Her smoking use included cigarettes. She has a 48.00 pack-year smoking history. She has never used smokeless tobacco. She reports previous alcohol use of about 2.0 standard drinks of alcohol per week. She reports that she does not use drugs.  Past Medical History:  Diagnosis Date  . Anemia   . Arthritis   . ATN (acute tubular necrosis) ([place])   . Atrial fibrillation ([place]) 2012  . Cancer ([place])    nose skin cancer- "not melanoma"  . Dysrhythmia    afib  . Gout   . History of kidney stones   . Hyperlipidemia   . Hypertension   . Kidney transplant recipient   . Liver cyst   . Nephrolithiasis   . 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 Pneumonia    03/08/17- many years ago  . Polycystic kidney disease 11/2016   transplant   . PONV (postoperative nausea and vomiting)   . 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 Pre-diabetes    due to antirejection medication  . Ruptured cyst of kidney   . SDH (subdural hematoma) (HCC)     Past Surgical History:  Procedure Laterality Date  . BASCILIC VEIN TRANSPOSITION Left 09/07/2016   Procedure: BASILIC VEIN  TRANSPOSITION LEFT UPPER ARM;  Surgeon: Elam Dutch, MD;  Location: [facility];  Service: Vascular;  Laterality: Left;  . COLONOSCOPY  03/2004   repeat 10 years  . Evacuation of subdural hematoma  11/26/2000  . KIDNEY STONE SURGERY     Cysto  . KIDNEY TRANSPLANT     Nov 23, 2016  . LEFT ATRIAL APPENDAGE OCCLUSION  06/03/2017   Dr. Remus Blake   . LIGATION OF ARTERIOVENOUS  FISTULA Left 03/09/2017   Procedure: LIGATION OF LEFT BASILIC VEIN TRANSPOSITION;  Surgeon: Elam Dutch, MD;  Location: [facility];  Service: Vascular;  Laterality: Left;  . ORIF ELBOW FRACTURE  10/22/2011   Procedure: OPEN REDUCTION INTERNAL FIXATION (ORIF) ELBOW/OLECRANON FRACTURE;  Surgeon: Schuyler Amor, MD;  Location: [facility];  Service: Orthopedics;  Laterality: Left;  open reduction  possible lateral reconstruction with palmaris graft from left or right side.  [name] Kitchen RADIAL HEAD IMPLANT  09/24/2011   Procedure: RADIAL HEAD IMPLANT;  Surgeon: Schuyler Amor, MD;  Location: [facility];  Service: Orthopedics;  Laterality: Left;  left radial head replacement  . TONSILLECTOMY      Current Outpatient Medications  Medication Sig Dispense Refill  . acetaminophen (TYLENOL) 500 MG tablet Take 1,000 mg by mouth every 6 (six) hours as needed for fever (pain).    [name] Kitchen  aspirin EC 81 MG tablet Take 81 mg by mouth daily.    [name] Kitchen atorvastatin (LIPITOR) 40 MG tablet Take 40 mg by mouth every Monday, Wednesday, and Friday.     . Cyanocobalamin (B-12) 5000 MCG SUBL daily.    . cyclobenzaprine (FLEXERIL) 5 MG tablet Take 1 tablet (5 mg total) by mouth at bedtime as needed for muscle spasms. 30 tablet 0  . famotidine (PEPCID AC MAXIMUM STRENGTH) 20 MG tablet daily.    [name] Kitchen levocetirizine (XYZAL ALLERGY 24HR) 5 MG tablet     . MAGNESIUM PO Take by mouth.    . metFORMIN (GLUCOPHAGE) 500 MG tablet Take 500 mg by mouth daily with breakfast.     . Multiple Vitamins-Minerals (CENTRUM ADULTS PO) Take by mouth.    .  mycophenolate (MYFORTIC) 180 MG EC tablet TAKE 2 TABLETS BY MOUTH 2 TIMES DAILY.    . sotalol (BETAPACE) 80 MG tablet Take 80 mg by mouth 2 (two) times daily.     [name] Kitchen sulfamethoxazole-trimethoprim (BACTRIM) 400-80 MG tablet Take 1 tablet by mouth. M,W,F    . tacrolimus (PROGRAF) 1 MG capsule Take 5 mg in the AM and 4 mg in the PM by mouth daily    . venlafaxine XR (EFFEXOR-XR) 37.5 MG 24 hr capsule TAKE 1 CAPSULE BY MOUTH EVERY OTHER DAY 45 capsule 0   No current facility-administered medications for this visit.    Family History  Problem Relation Age of Onset  . Polycystic kidney disease Father   . Heart failure Father   . Heart disease Mother        Rheumatic fever    Review of Systems  Constitutional: Negative.   HENT: Negative.   Eyes: Negative.   Respiratory: Negative.   Cardiovascular: Negative.   Gastrointestinal: Negative.   Endocrine: Negative.   Genitourinary: Negative.   Musculoskeletal: Negative.   Skin: Negative.   Allergic/Immunologic: Negative.   Neurological: Negative.   Hematological: Negative.   Psychiatric/Behavioral: Negative.     Exam:   BP 130/80   Pulse 70   Temp (!) 97 F (36.1 C) (Skin)   Resp 16   Ht 5' 1.75" (1.568 m)   Wt 189 lb (85.7 kg)   LMP 07/11/2009 (Approximate)   BMI 34.85 kg/m   Height: 5' 1.75" (156.8 cm)  General appearance: alert, cooperative and appears stated age Head: Normocephalic, without obvious abnormality, atraumatic Neck: no adenopathy, supple, symmetrical, trachea midline and thyroid normal to inspection and palpation Lungs: clear to auscultation bilaterally Breasts: normal appearance, no masses or tenderness Heart: regular rate and rhythm Abdomen: soft, non-tender; bowel sounds normal; no masses,  no organomegaly Extremities: extremities normal, atraumatic, no cyanosis or edema Skin: Skin color, texture, turgor normal. No rashes or lesions Lymph nodes: Cervical, supraclavicular, and axillary nodes normal. No  abnormal inguinal nodes palpated Neurologic: Grossly normal   Pelvic: External genitalia:  no lesions              Urethra:  normal appearing urethra with no masses, tenderness or lesions              Bartholins and Skenes: normal                 Vagina: normal appearing vagina with normal color and discharge, no lesions              Cervix: no lesions              Pap taken: No. Bimanual Exam:  Uterus:  normal size, contour, position, consistency, mobility, non-tender              Adnexa: normal adnexa and no mass, fullness, tenderness               Rectovaginal: Confirms               Anus:  normal sphincter tone, no lesions  Chaperone, Terence Lux, CMA, was present for exam.  A:  Well Woman with normal exam PMp, no HRT H/o living donor renal transplant (h/o PCKD) Fibromyalgia H/o situation depression and anxiety H/o SVT and afib  P:   Mammogram guidelines reviews pap smear neg 2020.  Not indicated today. RF for Venlafaxine 37.5mg  every other day.  #45/4RF RF for flexeril 5mg  qhs prn.  #90/4RF Lab work done 02/2020 Pt aware colonoscopy later this year She is going to check with her nephrologist about the shingrix vaccination.  I think she does not need this as has done the Zostavx return annually or prn

## 2020-04-04 ENCOUNTER — Other Ambulatory Visit: Payer: Self-pay

## 2020-04-04 ENCOUNTER — Ambulatory Visit (INDEPENDENT_AMBULATORY_CARE_PROVIDER_SITE_OTHER): Payer: Medicare Other | Admitting: Obstetrics & Gynecology

## 2020-04-04 ENCOUNTER — Encounter: Payer: Self-pay | Admitting: Obstetrics & Gynecology

## 2020-04-04 VITALS — BP 130/80 | HR 70 | Temp 97.0°F | Resp 16 | Ht 61.75 in | Wt 189.0 lb

## 2020-04-04 DIAGNOSIS — M62838 Other muscle spasm: Secondary | ICD-10-CM | POA: Diagnosis not present

## 2020-04-04 DIAGNOSIS — Z01419 Encounter for gynecological examination (general) (routine) without abnormal findings: Secondary | ICD-10-CM | POA: Diagnosis not present

## 2020-04-04 MED ORDER — VENLAFAXINE HCL ER 37.5 MG PO CP24: ORAL_CAPSULE | ORAL | 4 refills | Status: DC

## 2020-04-04 MED ORDER — CYCLOBENZAPRINE HCL 5 MG PO TABS: 5.0000 mg | ORAL_TABLET | Freq: Every evening | ORAL | 1 refills | Status: DC | PRN

## 2020-04-07 ENCOUNTER — Encounter: Payer: Self-pay | Admitting: Physician Assistant

## 2020-04-23 ENCOUNTER — Ambulatory Visit (INDEPENDENT_AMBULATORY_CARE_PROVIDER_SITE_OTHER): Payer: Medicare Other

## 2020-04-23 ENCOUNTER — Encounter: Payer: Self-pay | Admitting: Physician Assistant

## 2020-04-23 ENCOUNTER — Other Ambulatory Visit: Payer: Self-pay

## 2020-04-23 ENCOUNTER — Ambulatory Visit (INDEPENDENT_AMBULATORY_CARE_PROVIDER_SITE_OTHER): Payer: Medicare Other | Admitting: Physician Assistant

## 2020-04-23 VITALS — BP 149/53 | HR 73 | Temp 97.9°F | Ht 61.75 in | Wt 192.0 lb

## 2020-04-23 DIAGNOSIS — R0781 Pleurodynia: Secondary | ICD-10-CM

## 2020-04-23 DIAGNOSIS — R05 Cough: Secondary | ICD-10-CM

## 2020-04-23 DIAGNOSIS — Q612 Polycystic kidney, adult type: Secondary | ICD-10-CM | POA: Diagnosis not present

## 2020-04-23 DIAGNOSIS — R059 Cough, unspecified: Secondary | ICD-10-CM

## 2020-04-23 NOTE — Progress Notes (Signed)
Subjective:    Patient ID: Mariah [name], female    DOB: 1956-06-29, 64 y.o.   MRN: [id number]  HPI  Pt is a 64 yo female s/p kidney transplant for polycystic kidney, she does get large kidney cyst that have to be drained from time to time who presents to the clinic for dry cough. Her wife has been sick for last 3 weeks with URI/bronchitis/sinusitis. Partner is feeling better. Her symptoms have not been as severe but due to her underyling medical conditions she is concerned. No fever, chills, body aches, loss of smell or taste, GI concerns. She does notice cough is worse after she eats. Pt denies any flank pain but she does have some left upper quadrant but more over her anterior rib pain with deep breath or palpation. Not started any new medications. She does have allergies but has been well controlled with xyzal.   .. Active Ambulatory Problems    Diagnosis Date Noted  . HYPERLIPIDEMIA 07/19/2006  . OBESITY, NOS 07/19/2006  . HYPERTENSION, BENIGN SYSTEMIC 07/19/2006  . KIDNEY STONE - NEPHROLITHIASIS 07/19/2006  . OSTEOARTHRITIS, MULTI SITES 07/19/2006  . Polycystic kidney 10/08/2008  . UNSPECIFIED VITAMIN D DEFICIENCY 10/09/2010  . Gout 10/09/2010  . GAMMA GLUTAMYL TRANSFERASE, SERUM, ELEVATED 10/13/2010  . Kidney disease, chronic, stage III (GFR 30-59 ml/min) 11/06/2012  . Atrial fibrillation ([place]) 01/19/2013  . Palpitations 01/19/2013  . A-fib ([place]) 07/08/2014  . Injury of kidney 07/08/2014  . Acute respiratory failure ([place]) 07/11/2014  . 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 Absolute anemia 07/11/2014  . Anxiety 07/11/2014  . Tachycardia 09/11/2014  . Bilateral leg edema 09/11/2014  . Primary gout 09/11/2014  . Osteoarthritis of right knee 10/10/2014  . CKD (chronic kidney disease) stage 4, GFR 15-29 ml/min (HCC) 11/04/2014  . Mitral regurgitation 11/20/2014  . Hypertension 06/16/2015  . Hyperlipidemia 06/16/2015  . Poor venous access 10/28/2015  .  Adenomatous polyp of colon 05/12/2016  . Paroxysmal atrial fibrillation ([place]) 01/27/2017  . 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 Hx of motion sickness 10/17/2017  . Muscle spasm 10/17/2017  . Status post living-donor kidney transplantation 11/29/2016  . Immunosuppression ([place]) 11/29/2016  . Iron deficiency anemia, unspecified 09/25/2014  . Nephrolithiasis 11/14/2017  . 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 ATN (acute tubular necrosis) ([place]) 07/06/2018  . Liver cyst 06/15/2018  . Presence of Watchman left atrial appendage closure device 12/07/2017   Resolved Ambulatory Problems    Diagnosis Date Noted  . UTI 12/23/2008  . INFECTIOUS COLITIS ENTERITIS AND GASTROENTERITIS 10/09/2010  . Acute maxillary sinusitis 12/22/2010  . Abdominal pain 07/10/2014  . BP (high blood pressure) 07/08/2014  . AKI (acute kidney injury) ([place]) 06/16/2015  . Fever 06/16/2015  . Left flank pain 03/09/2016   Past Medical History:  Diagnosis Date  . Anemia   . Arthritis   . Cancer ([place])   . Dysrhythmia   . History of kidney stones   . Kidney transplant recipient   . Pneumonia   . Polycystic kidney disease 11/2016  . PONV (postoperative nausea and vomiting)   . 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 Pre-diabetes   . Ruptured cyst of kidney   . SDH (subdural hematoma) (H[facility])      Review of Systems See HPI.     Objective:   Physical Exam Vitals reviewed.  Constitutional:      Appearance: Normal appearance. She is obese.  HENT:     Head:  Normocephalic.     Comments: No sinus tenderness to palpation.     Right Ear: Tympanic membrane normal.     Left Ear: Tympanic membrane normal.     Nose: Nose normal.     Mouth/Throat:     Mouth: Mucous membranes are moist.     Pharynx: Posterior oropharyngeal erythema present.     Comments: Oropharynx erythema.  Eyes:     Extraocular Movements: Extraocular movements intact.     Conjunctiva/sclera: Conjunctivae  normal.     Pupils: Pupils are equal, round, and reactive to light.  Cardiovascular:     Rate and Rhythm: Normal rate and regular rhythm.     Pulses: Normal pulses.  Pulmonary:     Effort: Pulmonary effort is normal.     Breath sounds: Normal breath sounds. No wheezing or rhonchi.     Comments: Tenderness to palpation over left anterior ribs and with deep breathing.  Abdominal:     General: Bowel sounds are normal. There is no distension.     Palpations: Abdomen is soft.     Tenderness: There is abdominal tenderness. There is no right CVA tenderness, left CVA tenderness, guarding or rebound.     Comments: More over left ribs but into abdominal cavity.   Neurological:     General: No focal deficit present.     Mental Status: She is alert and oriented to person, place, and time.  Psychiatric:        Mood and Affect: Mood normal.           Assessment & Plan:  [name] KitchenMarland KitchenDiagnoses and all orders for this visit:  Cough -     DG Ribs Unilateral W/Chest Left  Rib pain on left side -     DG Ribs Unilateral W/Chest Left  ADPKD (autosomal dominant polycystic kidney)   Reassured patient that cough did not seem infectious. Likely more allergic, PND vs GERD.  Pt does get large kidney cyst in this area. If pain worsening consider imaging to look for cyst. Call nephrology.  Use flonase for next 7 days see if any improvement. Consider adding short term PPI to see if would help with GERD.  Ordered CXR and rib xray due to pain.  Follow up as needed.

## 2020-04-25 NOTE — Progress Notes (Signed)
Mariah [name],   Lungs normal and no acute abnormalities.

## 2020-05-30 ENCOUNTER — Other Ambulatory Visit: Payer: Self-pay | Admitting: Obstetrics & Gynecology

## 2020-05-30 DIAGNOSIS — M62838 Other muscle spasm: Secondary | ICD-10-CM

## 2020-05-30 NOTE — Telephone Encounter (Signed)
Medication refill request: Cyclobenzaprine 5mg  Last AEX:  04/04/20 Next AEX: 05/29/21 Last MMG (if hormonal medication request): NA Refill authorized: 30/0

## 2020-06-17 ENCOUNTER — Ambulatory Visit: Payer: Medicare Other

## 2020-06-30 ENCOUNTER — Ambulatory Visit (INDEPENDENT_AMBULATORY_CARE_PROVIDER_SITE_OTHER): Payer: Medicare Other | Admitting: Physician Assistant

## 2020-06-30 DIAGNOSIS — Z23 Encounter for immunization: Secondary | ICD-10-CM

## 2020-07-02 ENCOUNTER — Encounter: Payer: Self-pay | Admitting: Physician Assistant

## 2020-07-08 ENCOUNTER — Encounter: Payer: Self-pay | Admitting: Obstetrics & Gynecology

## 2020-09-09 ENCOUNTER — Ambulatory Visit (INDEPENDENT_AMBULATORY_CARE_PROVIDER_SITE_OTHER): Payer: Medicare Other

## 2020-09-09 ENCOUNTER — Ambulatory Visit (INDEPENDENT_AMBULATORY_CARE_PROVIDER_SITE_OTHER): Payer: Medicare Other | Admitting: Sports Medicine

## 2020-09-09 ENCOUNTER — Other Ambulatory Visit: Payer: Self-pay

## 2020-09-09 DIAGNOSIS — M1711 Unilateral primary osteoarthritis, right knee: Secondary | ICD-10-CM

## 2020-09-09 NOTE — Progress Notes (Signed)
    Procedures performed today:    Procedure: Real-time Ultrasound Guided aspiration/injection of right knee Device: Samsung HS60  Verbal informed consent obtained.  Time-out conducted.  Noted no overlying erythema, induration, or other signs of local infection.  Skin prepped in a sterile fashion.  Local anesthesia: Topical Ethyl chloride.  With sterile technique and under real time ultrasound guidance:  Advanced 18-gauge needle into the suprapatellar recess, aspirated 6 cc of clear, straw-colored fluid, syringe switched and 1 cc Kenalog 40, 2 cc lidocaine, 2 cc bupivacaine injected easily Completed without difficulty  Advised to call if fevers/chills, erythema, induration, drainage, or persistent bleeding.  Images permanently stored and available for review in PACS.  Impression: Technically successful ultrasound guided injection.  Independent interpretation of notes and tests performed by another provider:   None.  Brief History, Exam, Impression, and Recommendations:    Osteoarthritis of right knee Repeat aspiration and injection, previously done in April, return as needed.    ___________________________________________ Gwen Her. Dianah Field, M.D., ABFM., CAQSM. Primary Care and [place] Instructor of [employer]

## 2020-09-09 NOTE — Assessment & Plan Note (Signed)
Repeat aspiration and injection, previously done in April, return as needed.

## 2020-09-25 DIAGNOSIS — G8929 Other chronic pain: Secondary | ICD-10-CM

## 2020-09-26 NOTE — Assessment & Plan Note (Signed)
Aspiration injection did not provide sufficient relief, significant locking and other mechanical symptoms concerning for meniscal tearing, need an MRI at this point for arthroscopy planning.

## 2020-10-05 ENCOUNTER — Other Ambulatory Visit: Payer: Self-pay

## 2020-10-05 ENCOUNTER — Ambulatory Visit (INDEPENDENT_AMBULATORY_CARE_PROVIDER_SITE_OTHER): Payer: Medicare Other

## 2020-10-05 DIAGNOSIS — G8929 Other chronic pain: Secondary | ICD-10-CM

## 2020-10-05 DIAGNOSIS — S83281A Other tear of lateral meniscus, current injury, right knee, initial encounter: Secondary | ICD-10-CM | POA: Diagnosis not present

## 2020-10-05 DIAGNOSIS — M25461 Effusion, right knee: Secondary | ICD-10-CM | POA: Diagnosis not present

## 2020-11-10 ENCOUNTER — Encounter: Payer: Self-pay | Admitting: Physician Assistant

## 2020-11-17 ENCOUNTER — Other Ambulatory Visit: Payer: Self-pay

## 2020-11-17 ENCOUNTER — Encounter: Payer: Self-pay | Admitting: Physician Assistant

## 2020-11-17 ENCOUNTER — Ambulatory Visit (INDEPENDENT_AMBULATORY_CARE_PROVIDER_SITE_OTHER): Payer: Medicare Other | Admitting: Physician Assistant

## 2020-11-17 VITALS — BP 128/63 | HR 72 | Ht 61.75 in | Wt 197.0 lb

## 2020-11-17 DIAGNOSIS — F419 Anxiety disorder, unspecified: Secondary | ICD-10-CM

## 2020-11-17 DIAGNOSIS — M62838 Other muscle spasm: Secondary | ICD-10-CM | POA: Diagnosis not present

## 2020-11-17 MED ORDER — VENLAFAXINE HCL ER 37.5 MG PO CP24: ORAL_CAPSULE | ORAL | 3 refills | Status: DC

## 2020-11-17 MED ORDER — CYCLOBENZAPRINE HCL 5 MG PO TABS: ORAL_TABLET | ORAL | 3 refills | Status: DC

## 2020-11-17 NOTE — Progress Notes (Signed)
Subjective:    Patient ID: Mariah [name], female    DOB: [birth year], 65 y.o.   MRN: [id number]  HPI  Pt is a 65 yo female with A.fib, HTN, RAS of transplanted kidney, ADPKD, and anxiety who presents to the clinic to follow up on anxiety.   She sees transplant yearly. It has been 4 years since transplant and nephrology, Mariah [name] every 3 months. He manages most all her medications and gets labs.   No problems with effexor doing great. No SI/HC.   She still using flexeril every night at bedtime. Needs refills.   .. Active Ambulatory Problems    Diagnosis Date Noted  . HYPERLIPIDEMIA 07/19/2006  . OBESITY, NOS 07/19/2006  . HYPERTENSION, BENIGN SYSTEMIC 07/19/2006  . KIDNEY STONE - NEPHROLITHIASIS 07/19/2006  . OSTEOARTHRITIS, MULTI SITES 07/19/2006  . Polycystic kidney 10/08/2008  . UNSPECIFIED VITAMIN D DEFICIENCY 10/09/2010  . Gout 10/09/2010  . GAMMA GLUTAMYL TRANSFERASE, SERUM, ELEVATED 10/13/2010  . Kidney disease, chronic, stage III (GFR 30-59 ml/min) (HCC) 11/06/2012  . Atrial fibrillation ([place]) 01/19/2013  . Palpitations 01/19/2013  . A-fib ([place]) 07/08/2014  . Injury of kidney 07/08/2014  . 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 Absolute anemia 07/11/2014  . Anxiety 07/11/2014  . Tachycardia 09/11/2014  . Bilateral leg edema 09/11/2014  . Primary gout 09/11/2014  . Chronic pain of right knee 10/10/2014  . CKD (chronic kidney disease) stage 4, GFR 15-29 ml/min (HCC) 11/04/2014  . Mitral regurgitation 11/20/2014  . Hypertension 06/16/2015  . Hyperlipidemia 06/16/2015  . Poor venous access 10/28/2015  . Adenomatous polyp of colon 05/12/2016  . Paroxysmal atrial fibrillation ([place]) 01/27/2017  . 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 Hx of motion sickness 10/17/2017  . Muscle spasm 10/17/2017  . Status post living-donor kidney transplantation 11/29/2016  . Immunosuppression ([place]) 11/29/2016  . Iron deficiency anemia, unspecified 09/25/2014  .  Nephrolithiasis 11/14/2017  . 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 ATN (acute tubular necrosis) ([place]) 07/06/2018  . Liver cyst 06/15/2018  . Presence of Watchman left atrial appendage closure device 12/07/2017   Resolved Ambulatory Problems    Diagnosis Date Noted  . UTI 12/23/2008  . INFECTIOUS COLITIS ENTERITIS AND GASTROENTERITIS 10/09/2010  . Acute maxillary sinusitis 12/22/2010  . Abdominal pain 07/10/2014  . Acute respiratory failure ([place]) 07/11/2014  . BP (high blood pressure) 07/08/2014  . AKI (acute kidney injury) ([place]) 06/16/2015  . Fever 06/16/2015  . Left flank pain 03/09/2016   Past Medical History:  Diagnosis Date  . Anemia   . Arthritis   . Cancer ([place])   . Dysrhythmia   . History of kidney stones   . Kidney transplant recipient   . Pneumonia   . Polycystic kidney disease 11/2016  . PONV (postoperative nausea and vomiting)   . Post-transplant diabetes mellitus ([employer])   . Pre-diabetes   . Ruptured cyst of kidney   . SDH (subdural hematoma) (HCC)       Review of Systems  All other systems reviewed and are negative.      Objective:   Physical Exam Vitals reviewed.  Constitutional:      Appearance: Normal appearance. She is obese.  Cardiovascular:     Rate and Rhythm: Normal rate and regular rhythm.     Pulses: Normal pulses.     Heart sounds: Normal heart sounds.  Pulmonary:     Effort: Pulmonary effort is normal.     Breath sounds: Normal breath  sounds.  Neurological:     General: No focal deficit present.     Mental Status: She is alert and oriented to person, place, and time.  Psychiatric:        Mood and Affect: Mood normal.    .. Depression screen [facility] 2/9 11/17/2020 09/09/2017  Decreased Interest 0 0  Down, Depressed, Hopeless 0 0  PHQ - 2 Score 0 0  Altered sleeping 0 -  Tired, decreased energy 0 -  Change in appetite 0 -  Feeling bad or failure about yourself  0 -  Trouble concentrating  0 -  Moving slowly or fidgety/restless 0 -  Suicidal thoughts 0 -  PHQ-9 Score 0 -  Difficult doing work/chores Not difficult at all -   .. GAD 7 : Generalized Anxiety Score 11/17/2020  Nervous, Anxious, on Edge 0  Control/stop worrying 0  Worry too much - different things 0  Trouble relaxing 0  Restless 0  Easily annoyed or irritable 0  Afraid - awful might happen 0  Total GAD 7 Score 0  Anxiety Difficulty Not difficult at all           Assessment & Plan:  [name] KitchenMarland KitchenLetonya was seen today for follow-up.  Diagnoses and all orders for this visit:  Anxiety -     venlafaxine XR (EFFEXOR-XR) 37.5 MG 24 hr capsule; TAKE 1 CAPSULE BY MOUTH EVERY OTHER DAY  Muscle spasm -     cyclobenzaprine (FLEXERIL) 5 MG tablet; TAKE 1 TABLET BY MOUTH AT BEDTIME AS NEEDED FOR MUSCLE SPASM   PHQ and GAD numbers look great. Refilled effexor for 1 year.   Labs UTD with nephrology.

## 2020-12-02 ENCOUNTER — Telehealth: Payer: Self-pay | Admitting: Neurology

## 2020-12-02 NOTE — Telephone Encounter (Signed)
Clearance completed and faxed with labs from renal doctors to [phone] with confirmation received.

## 2020-12-03 ENCOUNTER — Encounter (HOSPITAL_BASED_OUTPATIENT_CLINIC_OR_DEPARTMENT_OTHER): Payer: Self-pay

## 2020-12-13 HISTORY — PX: REPLACEMENT TOTAL KNEE: SUR1224

## 2021-01-02 ENCOUNTER — Telehealth: Payer: Self-pay | Admitting: General Practice

## 2021-01-02 NOTE — Telephone Encounter (Signed)
Transition Care Management Follow-up Telephone Call  Date of discharge and from where: [facility] on 12/31/20  How have you been since you were released from the hospital? Feels extremely tired.   Any questions or concerns? No  Items Reviewed:  Did the pt receive and understand the discharge instructions provided? Yes   Medications obtained and verified? Yes   Other? No   Any new allergies since your discharge? Yes   Dietary orders reviewed? Yes  Do you have support at home? Yes   Functional Questionnaire: (I = Independent and D = Dependent) ADLs: I  Bathing/Dressing- D  Meal Prep- D  Eating- D  Maintaining continence- I  Transferring/Ambulation- I  Managing Meds- I  Follow up appointments reviewed:   PCP Hospital f/u appt confirmed? Yes  Scheduled to see [facility] on 01/20/21 @ 1340.  [facility] f/u appt confirmed? Yes  Scheduled to see Transplant clinic on 01/07/21 @ 0900. Cardiologist- 01/14/21 at 1300.  Are transportation arrangements needed? Yes   If their condition worsens, is the pt aware to call PCP or go to the Emergency Dept.? Yes  Was the patient provided with contact information for the PCP's office or ED? Yes  Was to pt encouraged to call back with questions or concerns? Yes

## 2021-01-20 ENCOUNTER — Inpatient Hospital Stay: Payer: Medicare Other | Admitting: Physician Assistant

## 2021-04-07 ENCOUNTER — Ambulatory Visit (HOSPITAL_BASED_OUTPATIENT_CLINIC_OR_DEPARTMENT_OTHER): Payer: Medicare Other | Admitting: Obstetrics & Gynecology

## 2021-04-14 ENCOUNTER — Other Ambulatory Visit: Payer: Self-pay

## 2021-04-14 ENCOUNTER — Ambulatory Visit (INDEPENDENT_AMBULATORY_CARE_PROVIDER_SITE_OTHER): Payer: Medicare Other | Admitting: Obstetrics & Gynecology

## 2021-04-14 ENCOUNTER — Encounter (HOSPITAL_BASED_OUTPATIENT_CLINIC_OR_DEPARTMENT_OTHER): Payer: Self-pay | Admitting: Obstetrics & Gynecology

## 2021-04-14 VITALS — BP 133/67 | HR 71 | Ht 61.75 in | Wt 190.0 lb

## 2021-04-14 DIAGNOSIS — Z01419 Encounter for gynecological examination (general) (routine) without abnormal findings: Secondary | ICD-10-CM

## 2021-04-14 DIAGNOSIS — D849 Immunodeficiency, unspecified: Secondary | ICD-10-CM

## 2021-04-14 DIAGNOSIS — Q612 Polycystic kidney, adult type: Secondary | ICD-10-CM

## 2021-04-14 DIAGNOSIS — Z940 Kidney transplant status: Secondary | ICD-10-CM

## 2021-04-14 DIAGNOSIS — Z780 Asymptomatic menopausal state: Secondary | ICD-10-CM

## 2021-04-14 DIAGNOSIS — E2839 Other primary ovarian failure: Secondary | ICD-10-CM

## 2021-04-14 NOTE — Progress Notes (Signed)
65 y.o. [place] Married White or Caucasian female here for breast and pelvic exam.  Had MMG earlier today at [facility].  Denies vaginal bleeding.    H/o renal transplant.  Creatinine elevated a little bit with labs in early June at 1.28.  Recheck will be done within the next month.  Most recent nephrology note reviewed from 11/2020 and lab work form 03/2021 reviewed.    Had knee replacement in March with Dr. Mayer Camel.  Ended up with severe constipation due to pain medications.  CT 12/29/2020 reviewed.  Lab work reviewed from this visit and from 03/2021.  Planning bilateral nephrectomy of native kidneys hopefully this year.  Patient's last menstrual period was 07/11/2009 (approximate).          The current method of family planning is post menopausal status.    Exercising: Yes.     Exercising with new puppy Smoker:  no  Health Maintenance: Pap:  11/20/2018 Negative History of abnormal Pap:  yes MMG:  01/17/2020 Negative Colonoscopy:  08/04/2015, follow up 5 years.  Pt aware. BMD:   01/06/2011 TDaP:  07/2011 Pneumonia vaccine(s):  2017 and 2019 Shingrix:   2014 Hep C testing: 2016 Screening Labs: done with PCP and nephrologist   reports that she quit smoking about 24 years ago. Her smoking use included cigarettes. She has a 48.00 pack-year smoking history. She has never used smokeless tobacco. She reports previous alcohol use of about 2.0 standard drinks of alcohol per week. She reports that she does not use drugs.  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[place])    Pneumonia    03/08/17- many years ago   Polycystic  kidney disease 11/2016   transplant    PONV (postoperative nausea and vomiting)    Post-transplant diabetes mellitus (HCC)    Pre-diabetes    due to antirejection medication   Ruptured cyst of kidney    SDH (subdural hematoma) ([place])     Past Surgical History:  Procedure Laterality Date   BASCILIC VEIN TRANSPOSITION Left 09/07/2016   Procedure: BASILIC VEIN TRANSPOSITION LEFT UPPER ARM;  Surgeon: Elam Dutch, MD;  Location: [place];  Service: Vascular;  Laterality: Left;   COLONOSCOPY  03/2004   repeat 10 years   Evacuation of subdural hematoma  11/26/2000   KIDNEY STONE SURGERY     Cysto   KIDNEY TRANSPLANT     Nov 23, 2016   LEFT ATRIAL APPENDAGE OCCLUSION  06/03/2017   Dr. Remus Blake    LIGATION OF ARTERIOVENOUS  FISTULA Left 03/09/2017   Procedure: LIGATION OF LEFT BASILIC VEIN TRANSPOSITION;  Surgeon: Elam Dutch, MD;  Location: [facility] OR;  Service: Vascular;  Laterality: Left;   ORIF ELBOW FRACTURE  10/22/2011   Procedure: OPEN REDUCTION INTERNAL FIXATION (ORIF) ELBOW/OLECRANON FRACTURE;  Surgeon: Schuyler Amor, MD;  Location: [place];  Service: Orthopedics;  Laterality: Left;  open reduction  possible lateral reconstruction with palmaris graft from left or right side.   RADIAL HEAD IMPLANT  09/24/2011  Procedure: RADIAL HEAD IMPLANT;  Surgeon: Schuyler Amor, MD;  Location: [place];  Service: Orthopedics;  Laterality: Left;  left radial head replacement   REPLACEMENT TOTAL KNEE  12/13/2020   Dr. Mayer Camel   TONSILLECTOMY      Current Outpatient Medications  Medication Sig Dispense Refill   acetaminophen (TYLENOL) 500 MG tablet Take 1,000 mg by mouth every 6 (six) hours as needed for fever (pain).     aspirin EC 81 MG tablet Take 81 mg by mouth daily.     atorvastatin (LIPITOR) 40 MG tablet Take 40 mg by mouth every Monday, Wednesday, and Friday.      cyclobenzaprine (FLEXERIL) 5 MG tablet TAKE 1 TABLET BY MOUTH AT BEDTIME AS  NEEDED FOR MUSCLE SPASM 90 tablet 3   famotidine (PEPCID) 20 MG tablet daily.     levocetirizine (XYZAL) 5 MG tablet      MAGNESIUM PO Take by mouth.     metFORMIN (GLUCOPHAGE) 500 MG tablet Take 500 mg by mouth daily with breakfast.      mycophenolate (MYFORTIC) 180 MG EC tablet TAKE 2 TABLETS BY MOUTH 2 TIMES DAILY.     ondansetron (ZOFRAN) 4 MG tablet Take 4 mg by mouth every 8 (eight) hours as needed.     sulfamethoxazole-trimethoprim (BACTRIM) 400-80 MG tablet Take 1 tablet by mouth. M,W,F     tacrolimus (PROGRAF) 1 MG capsule Take 5 mg in the AM and 4 mg in the PM by mouth daily     venlafaxine XR (EFFEXOR-XR) 37.5 MG 24 hr capsule TAKE 1 CAPSULE BY MOUTH EVERY OTHER DAY 45 capsule 3   sotalol (BETAPACE) 80 MG tablet Take 80 mg by mouth 2 (two) times daily.      No current facility-administered medications for this visit.    Family History  Problem Relation Age of Onset   Polycystic kidney disease Father    Heart failure Father    Heart disease Mother        Rheumatic fever    Review of Systems  Constitutional: Negative.   Gastrointestinal: Negative.   Genitourinary: Negative.    Exam:   BP 133/67   Pulse 71   Ht 5' 1.75" (1.568 m)   Wt 190 lb (86.2 kg)   LMP 07/11/2009 (Approximate)   BMI 35.03 kg/m   Height: 5' 1.75" (156.8 cm)  General appearance: alert, cooperative and appears stated age Head: Normocephalic, without obvious abnormality, atraumatic Neck: no adenopathy, supple, symmetrical, trachea midline and thyroid normal to inspection and palpation Lungs: clear to auscultation bilaterally Breasts: normal appearance, no masses or tenderness Heart: regular rate and rhythm Abdomen: soft, non-tender; bowel sounds normal; no masses,  no organomegaly Extremities: extremities normal, atraumatic, no cyanosis or edema Skin: Skin color, texture, turgor normal. No rashes or lesions Lymph nodes: Cervical, supraclavicular, and axillary nodes normal. No abnormal inguinal  nodes palpated Neurologic: Grossly normal   Pelvic: External genitalia:  no lesions              Urethra:  normal appearing urethra with no masses, tenderness or lesions              Bartholins and Skenes: normal                 Vagina: normal appearing vagina with normal color and no discharge, no lesions, atrophic changes noted              Cervix: no lesions  Pap taken: Yes.   Bimanual Exam:  Uterus:  normal size, contour, position, consistency, mobility, non-tender              Adnexa: normal adnexa and no mass, fullness, tenderness               Rectovaginal: Confirms               Anus:  normal sphincter tone, no lesions  Chaperone, Octaviano Batty, CMA, was present for exam.  Assessment/Plan: 1. Gynecologic exam normal - pap smear obtained today - MMG done earlier today - colonoscopy was due last year.  Pt aware.  Planning on having later this summer/early fall - bmd will be ordered - vaccines reviewed - lab work done with nephrologist, Dr Duwayne Heck, and PCP, Iran Planas  2. Hypoestrogenism - DG BONE DENSITY (DXA); Future  3. Postmenopausal - no HRT  4. ADPKD (autosomal dominant polycystic kidney)  5. Immunosuppression ([place])  6. Renal transplant recipient

## 2021-04-17 ENCOUNTER — Ambulatory Visit (HOSPITAL_BASED_OUTPATIENT_CLINIC_OR_DEPARTMENT_OTHER): Payer: Medicare Other | Admitting: Obstetrics & Gynecology

## 2021-04-20 ENCOUNTER — Encounter (HOSPITAL_BASED_OUTPATIENT_CLINIC_OR_DEPARTMENT_OTHER): Payer: Self-pay | Admitting: Obstetrics & Gynecology

## 2021-04-28 ENCOUNTER — Ambulatory Visit (INDEPENDENT_AMBULATORY_CARE_PROVIDER_SITE_OTHER): Payer: Medicare Other | Admitting: Family Medicine

## 2021-04-28 VITALS — Ht 62.0 in | Wt 189.4 lb

## 2021-04-28 DIAGNOSIS — Z0000 Encounter for general adult medical examination without abnormal findings: Secondary | ICD-10-CM | POA: Diagnosis not present

## 2021-04-28 NOTE — Progress Notes (Signed)
MEDICARE ANNUAL WELLNESS VISIT  04/28/2021  Telephone Visit Disclaimer This Medicare AWV was conducted by telephone due to national recommendations for restrictions regarding the COVID-19 Pandemic (e.g. social distancing).  I verified, using two identifiers, that I am speaking with Mariah [name] or their authorized healthcare agent. I discussed the limitations, risks, security, and privacy concerns of performing an evaluation and management service by telephone and the potential availability of an in-person appointment in the future. The patient expressed understanding and agreed to proceed.  Location of Patient: Home Location of Provider (nurse):  In the office.  Subjective:    Mariah [name] is a 65 y.o. female patient of Donella Stade, PA-C who had a [employer] Visit today via telephone. Mariah [name] is Working part time and lives with their spouse. she has 0 children. she reports that she is socially active and does interact with friends/family regularly. she is minimally physically active and enjoys fishing and camping.  Patient Care Team: Lavada Mesi as PCP - General (Family Medicine)  Advanced Directives 04/28/2021 06/17/2018 04/21/2017 02/24/2017 10/21/2016 06/15/2015 10/21/2011  Does Patient Have a Medical Advance Directive? Yes Yes Yes Yes Yes No Patient does not have advance directive  Type of Advance Directive [place];Living will - [place];Living will [place];Living will [place];Living will - -  Does patient want to make changes to medical advance directive? No - Patient declined - - - - - -  Copy of [place] in Chart? Yes - validated most recent copy scanned in chart (See row information) - - - - [employer] Utilization Over the Past 12 Months: # of hospitalizations or ER visits: 1 # of surgeries: 1  Review of Systems    Patient reports that her  overall health is unchanged compared to last year.  History obtained from chart review and the patient  Patient Reported Readings (BP, Pulse, CBG, Weight, etc) Weight 189.4 lb Height 33f2  Pain Assessment Pain : No/denies pain     Current Medications & Allergies (verified) Allergies as of 04/28/2021       Reactions   Codeine Itching   Morphine Itching   Poison Ivy Extract    Rash, itching   Zovirax [acyclovir Sodium] Itching        Medication List        Accurate as of April 28, 2021 11:56 AM. If you have any questions, ask your nurse or doctor.          acetaminophen 500 MG tablet Commonly known as: TYLENOL Take 1,000 mg by mouth every 6 (six) hours as needed for fever (pain).   aspirin EC 81 MG tablet Take 81 mg by mouth daily.   atorvastatin 40 MG tablet Commonly known as: LIPITOR Take 40 mg by mouth every Monday, Wednesday, and Friday.   cyclobenzaprine 5 MG tablet Commonly known as: FLEXERIL TAKE 1 TABLET BY MOUTH AT BEDTIME AS NEEDED FOR MUSCLE SPASM   famotidine 20 MG tablet Commonly known as: PEPCID daily.   HYDROmorphone 2 MG tablet Commonly known as: DILAUDID Take by mouth every 4 (four) hours as needed for severe pain.   levocetirizine 5 MG tablet Commonly known as: XYZAL   MAGNESIUM PO Take by mouth.   metFORMIN 500 MG tablet Commonly known as: GLUCOPHAGE Take 500 mg by mouth daily with breakfast.   mycophenolate 180 MG EC tablet Commonly known as:  MYFORTIC TAKE 2 TABLETS BY MOUTH 2 TIMES DAILY.   ondansetron 4 MG tablet Commonly known as: ZOFRAN Take 4 mg by mouth every 8 (eight) hours as needed.   sotalol 80 MG tablet Commonly known as: BETAPACE Take 80 mg by mouth 2 (two) times daily.   sulfamethoxazole-trimethoprim 400-80 MG tablet Commonly known as: BACTRIM Take 1 tablet by mouth. 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"   Dysrhythmia    afib   Gout    History of kidney stones    Hyperlipidemia    Hypertension    Kidney transplant recipient    Liver cyst    Nephrolithiasis    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[place])    Pneumonia    03/08/17- many years ago   Polycystic kidney disease 11/2016   transplant    PONV (postoperative nausea and vomiting)    Post-transplant diabetes mellitus ([place])    Pre-diabetes    due to antirejection medication   Ruptured cyst of kidney    SDH (subdural hematoma) ([place])    Past Surgical History:  Procedure Laterality Date   BASCILIC VEIN TRANSPOSITION Left 09/07/2016   Procedure: BASILIC VEIN TRANSPOSITION LEFT UPPER ARM;  Surgeon: Elam Dutch, MD;  Location: [place];  Service: Vascular;  Laterality: Left;   COLONOSCOPY  03/2004   repeat 10 years   Evacuation of subdural hematoma  11/26/2000   KIDNEY STONE SURGERY     Cysto   KIDNEY TRANSPLANT     Nov 23, 2016   LEFT ATRIAL APPENDAGE OCCLUSION  06/03/2017   Dr. Remus Blake    LIGATION OF ARTERIOVENOUS  FISTULA Left 03/09/2017   Procedure: LIGATION OF LEFT BASILIC VEIN TRANSPOSITION;  Surgeon: Elam Dutch, MD;  [facility];  Service: Vascular;  Laterality: Left;   ORIF ELBOW FRACTURE  10/22/2011   Procedure: OPEN REDUCTION INTERNAL FIXATION (ORIF) ELBOW/OLECRANON FRACTURE;  Surgeon: Schuyler Amor, MD;  Location: [place];  Service: Orthopedics;  Laterality: Left;  open reduction  possible lateral reconstruction with palmaris graft from left or right side.   RADIAL HEAD IMPLANT  09/24/2011   Procedure: RADIAL HEAD IMPLANT;  Surgeon: Schuyler Amor,  MD;  Location: [place];  Service: Orthopedics;  Laterality: Left;  left radial head replacement   REPLACEMENT TOTAL KNEE  12/13/2020   Dr. Mayer Camel   TONSILLECTOMY     Family History  Problem Relation Age of Onset   Polycystic kidney disease Father    Heart failure Father    Heart disease Mother        Rheumatic fever   Social History   Socioeconomic History   Marital status: Married    Spouse name: Olin Hauser   Number of children: 0   Years of education: 12   Highest education level: 12th grade  Occupational History   Occupation: OWNER    Employer: [employer]   Occupation: Financial controller    Comment: part-time  Tobacco Use   Smoking status: Former    Packs/day: 2.00    Years: 24.00    Pack years: 48.00    Types: Cigarettes    Quit date: 12/12/1996    Years since quitting: 24.3   Smokeless tobacco: Never  Vaping Use   Vaping Use: Never used  Substance and Sexual Activity   Alcohol use: Not Currently    Alcohol/week: 2.0 standard drinks    Types: 2 Glasses of wine per week   Drug use: No   Sexual activity: Yes    Partners: Female  Other Topics Concern   Not on file  Social History Narrative   Lives with her wife. They have one dog. She enjoys fishing and walking.   Social Determinants of Health   Financial Resource Strain: Low Risk    Difficulty of Paying Living Expenses: Not hard at all  Food Insecurity: No Food Insecurity   Worried About Charity fundraiser in the Last Year: Never true   [place] in the Last Year: Never true  Transportation Needs: No Transportation Needs   Lack of Transportation (Medical): No   Lack of Transportation (Non-Medical): No  Physical Activity: Inactive   Days of Exercise per Week: 0 days   Minutes of Exercise per Session: 0 min  Stress: No Stress Concern Present   Feeling of Stress : Not at all  Social Connections: Moderately Isolated   Frequency of Communication with Friends and Family: More than three  times a week   Frequency of Social Gatherings with Friends and Family: Twice a week   Attends Religious Services: Never   Marine scientist or Organizations: No   Attends Archivist Meetings: Never   Marital Status: Married    Activities of Daily Living In your present state of health, do you have any difficulty performing the following activities: 04/28/2021  Hearing? N  Vision? N  Difficulty concentrating or making decisions? N  Walking or climbing stairs? N  Dressing or bathing? N  Doing errands, shopping? N  Preparing Food and eating ? N  Using the Toilet? N  In the past six months, have you accidently leaked urine? N  Do you have problems with loss of bowel control? N  Managing your Medications? N  Managing your Finances? N  Housekeeping or managing your Housekeeping? N  Some recent data might be hidden    Patient Education/ Literacy How often do you need to have someone help you when you read instructions, pamphlets, or other written materials from your doctor or pharmacy?: 1 - Never What is the last grade level you completed in school?: 12th grade  Exercise Current Exercise Habits: Home exercise routine, Type of exercise: walking, Time (Minutes): 30, Frequency (Times/Week): 2, Weekly Exercise (Minutes/Week): 60, Intensity: Mild, Exercise limited by: None identified  Diet Patient reports consuming 3 meals a day and 2 snack(s) a day Patient reports that her primary diet is: Regular Patient reports that she does have regular access to food.   Depression Screen PHQ 2/9 Scores 04/28/2021 11/17/2020 09/09/2017  PHQ - 2 Score 0 0 0  PHQ- 9 Score - 0 -     Fall Risk Fall Risk  04/28/2021  Falls in the past year? 0  Number falls in past yr: 0  Injury with Fall? 0  Risk for fall due to : No Fall Risks  Follow up Falls evaluation completed     Objective:  Mariah [name] seemed alert  and oriented and she participated appropriately during our telephone  visit.  Blood Pressure Weight BMI  BP Readings from Last 3 Encounters:  04/14/21 133/67  11/17/20 128/63  04/23/20 (!) 149/53   Wt Readings from Last 3 Encounters:  04/28/21 189 lb 6.4 oz (85.9 kg)  04/14/21 190 lb (86.2 kg)  11/17/20 197 lb (89.4 kg)   BMI Readings from Last 1 Encounters:  04/28/21 34.64 kg/m    *Unable to obtain current vital signs, weight, and BMI due to telephone visit type  Hearing/Vision  Pamala Hurry did not seem to have difficulty with hearing/understanding during the telephone conversation Reports that she has had a formal eye exam by an eye care professional within the past year Reports that she has not had a formal hearing evaluation within the past year *Unable to fully assess hearing and vision during telephone visit type  Cognitive Function: 6CIT Screen 04/28/2021  What Year? 0 points  What month? 0 points  What time? 0 points  Count back from 20 0 points  Months in reverse 0 points  Repeat phrase 0 points  Total Score 0   (Normal:0-7, Significant for Dysfunction: >8)  Normal Cognitive Function Screening: Yes   Immunization & Health Maintenance Record Immunization History  Administered Date(s) Administered   DTaP 07/27/2011   Hepatitis B 04/29/2016, 06/01/2016, 11/01/2016   Hepatitis B, adult 04/29/2016, 06/01/2016, 11/01/2016   Influenza Split 10/13/2012   Influenza Whole 09/12/2006, 09/19/2007   Influenza, Quadrivalent, Recombinant, Inj, Pf 06/15/2017   Influenza,inj,Quad PF,6+ Mos 07/05/2013, 06/26/2014, 06/02/2015, 06/01/2016, 07/13/2018, 06/04/2019, 06/30/2020   Influenza-Unspecified 07/05/2013, 06/26/2014, 06/02/2015, 06/01/2016, 07/13/2018, 06/04/2019   Janssen (J&J) SARS-COV-2 Vaccination 12/14/2019   Moderna Sars-Covid-2 Vaccination 08/06/2020, 11/26/2020   PPD Test 04/12/2016   Pneumococcal Conjugate-13 12/20/2017   Pneumococcal Polysaccharide-23 04/29/2016   Zoster, Live 12/26/2012    Health Maintenance  Topic Date Due    COVID-19 Vaccine (4 - Booster for Janssen series) 05/14/2021 (Originally 03/26/2021)   URINE MICROALBUMIN  05/29/2021 (Originally 03/15/1966)   Zoster Vaccines- Shingrix (1 of 2) 07/29/2021 (Originally 03/15/1974)   COLONOSCOPY (Pts 45-79yrs Insurance coverage will need to be confirmed)  04/28/2022 (Originally 08/03/2020)   PNA vac Low Risk Adult (2 of 2 - PPSV23) 04/29/2021   INFLUENZA VACCINE  05/11/2021   TETANUS/TDAP  07/11/2021   MAMMOGRAM  04/15/2022   DEXA SCAN  Completed   Hepatitis C Screening  Completed   HPV VACCINES  Aged Out       Assessment  This is a routine wellness examination for Mariah [name].  Health Maintenance: Due or Overdue There are no preventive care reminders to display for this patient.   Mariah [name] does not need a referral for Community Assistance: Care Management:   no Social Work:    no Prescription Assistance:  no Nutrition/Diabetes Education:  no   Plan:  Personalized Goals  Goals Addressed               This Visit's Progress     Patient Stated (pt-stated)        04/28/2021 AWV Goal: Exercise for General Health  Patient will verbalize understanding of the benefits of increased physical activity: Exercising regularly is important. It will improve your overall fitness, flexibility, and endurance. Regular exercise also will improve your overall health. It can help you control your weight, reduce stress, and improve your bone density. Over the next year, patient will increase physical activity as tolerated with a goal of at least 150 minutes of moderate  physical activity per week.  You can tell that you are exercising at a moderate intensity if your heart starts beating faster and you start breathing faster but can still hold a conversation. Moderate-intensity exercise ideas include: Walking 1 mile (1.6 km) in about 15 minutes Biking Hiking Golfing Dancing Water aerobics Patient will verbalize understanding of everyday activities  that increase physical activity by providing examples like the following: Yard work, such as: Sales promotion account executive Gardening Washing windows or floors Patient will be able to explain general safety guidelines for exercising:  Before you start a new exercise program, talk with your health care provider. Do not exercise so much that you hurt yourself, feel dizzy, or get very short of breath. Wear comfortable clothes and wear shoes with good support. Drink plenty of water while you exercise to prevent dehydration or heat stroke. Work out until your breathing and your heartbeat get faster.        Personalized Health Maintenance & Screening Recommendations  Shingrix Colorectal cancer screening - patient stated that she will call Dr. Lorie Apley office to schedule.  Lung Cancer Screening Recommended: no (Low Dose CT Chest recommended if Age 76-80 years, 30 pack-year currently smoking OR have quit w/in past 15 years) Hepatitis C Screening recommended: no HIV Screening recommended: no  Advanced Directives: Written information was not prepared per patient's request.  Referrals & Orders No orders of the defined types were placed in this encounter.   Follow-up Plan Follow-up with Donella Stade, PA-C as planned Schedule your shingrix vaccine at your pharmacy.  Please call Dr. Lorie Apley office to schedule your colonoscopy.  Medicare wellness visit in one year. Patient will access AVS on mychart.   I have personally reviewed and noted the following in the patient's chart:   Medical and social history Use of alcohol, tobacco or illicit drugs  Current medications and supplements Functional ability and status Nutritional status Physical activity Advanced directives List of other physicians Hospitalizations, surgeries, and ER visits in previous 12 months Vitals Screenings to include cognitive, depression, and  falls Referrals and appointments  In addition, I have reviewed and discussed with Mariah [name] certain preventive protocols, quality metrics, and best practice recommendations. A written personalized care plan for preventive services as well as general preventive health recommendations is available and can be mailed to the patient at her request.      Tinnie Gens, RN  04/28/2021

## 2021-04-28 NOTE — Patient Instructions (Addendum)
[facility] Maintenance Summary and Written Plan of Care  [name] ,  Thank you for allowing me to perform your Medicare Annual Wellness Visit and for your ongoing commitment to your health.   Health Maintenance & Immunization History Health Maintenance  Topic Date Due   COVID-19 Vaccine (4 - Booster for Janssen series) 05/14/2021 (Originally 03/26/2021)   URINE MICROALBUMIN  05/29/2021 (Originally 65 years old)   Zoster Vaccines- Shingrix (1 of 2) 07/29/2021 (Originally 03/15/1975)   COLONOSCOPY (Pts 66-66yrs Insurance coverage will need to be confirmed)  04/28/2022 (Originally 08/03/2020)   PNA vac Low Risk Adult (2 of 2 - PPSV23) 04/29/2021   INFLUENZA VACCINE  05/11/2021   TETANUS/TDAP  07/11/2021   MAMMOGRAM  04/15/2022   DEXA SCAN  Completed   Hepatitis C Screening  Completed   HPV VACCINES  Aged Out   Immunization History  Administered Date(s) Administered   DTaP 07/27/2011   Hepatitis B 04/29/2016, 06/01/2016, 11/01/2016   Hepatitis B, adult 04/29/2016, 06/01/2016, 11/01/2016   Influenza Split 10/13/2012   Influenza Whole 09/12/2006, 09/19/2007   Influenza, Quadrivalent, Recombinant, Inj, Pf 06/15/2017   Influenza,inj,Quad PF,6+ Mos 07/05/2013, 06/26/2014, 06/02/2015, 06/01/2016, 07/13/2018, 06/04/2019, 06/30/2020   Influenza-Unspecified 07/05/2013, 06/26/2014, 06/02/2015, 06/01/2016, 07/13/2018, 06/04/2019   Janssen (J&J) SARS-COV-2 Vaccination 12/14/2019   Moderna Sars-Covid-2 Vaccination 08/06/2020, 11/26/2020   PPD Test 04/12/2016   Pneumococcal Conjugate-13 12/20/2017   Pneumococcal Polysaccharide-23 04/29/2016   Zoster, Live 12/26/2012    These are the patient goals that we discussed:  Goals Addressed               This Visit's Progress     Patient Stated (pt-stated)        04/28/2021 AWV Goal: Exercise for General Health  Patient will verbalize understanding of the benefits of increased physical activity: Exercising regularly  is important. It will improve your overall fitness, flexibility, and endurance. Regular exercise also will improve your overall health. It can help you control your weight, reduce stress, and improve your bone density. Over the next year, patient will increase physical activity as tolerated with a goal of at least 150 minutes of moderate physical activity per week.  You can tell that you are exercising at a moderate intensity if your heart starts beating faster and you start breathing faster but can still hold a conversation. Moderate-intensity exercise ideas include: Walking 1 mile (1.6 km) in about 15 minutes Biking Hiking Golfing Dancing Water aerobics Patient will verbalize understanding of everyday activities that increase physical activity by providing examples like the following: Yard work, such as: Sales promotion account executive Gardening Washing windows or floors Patient will be able to explain general safety guidelines for exercising:  Before you start a new exercise program, talk with your health care provider. Do not exercise so much that you hurt yourself, feel dizzy, or get very short of breath. Wear comfortable clothes and wear shoes with good support. Drink plenty of water while you exercise to prevent dehydration or heat stroke. Work out until your breathing and your heartbeat get faster.          This is a list of Health Maintenance Items that are overdue or due now: Shingrix Colorectal cancer screening   Orders/Referrals Placed Today: No orders of the defined types were placed in this encounter.  (Contact our referral department at [phone] if you have not spoken with someone about your referral appointment within the  next 5 days)    Follow-up Plan Follow-up with Donella Stade, PA-C as planned Schedule your shingrix vaccine at your pharmacy.  Please call Dr. Lorie Apley office to  schedule your colonoscopy.  Medicare wellness visit in one year. Patient will access AVS on mychart.      Health Maintenance, Female Adopting a healthy lifestyle and getting preventive care are important in promoting health and wellness. Ask your health care provider about: The right schedule for you to have regular tests and exams. Things you can do on your own to prevent diseases and keep yourself healthy. What should I know about diet, weight, and exercise? Eat a healthy diet  Eat a diet that includes plenty of vegetables, fruits, low-fat dairy products, and lean protein. Do not eat a lot of foods that are high in solid fats, added sugars, or sodium.  Maintain a healthy weight Body mass index (BMI) is used to identify weight problems. It estimates body fat based on height and weight. Your health care provider can help determineyour BMI and help you achieve or maintain a healthy weight. Get regular exercise Get regular exercise. This is one of the most important things you can do for your health. Most adults should: Exercise for at least 150 minutes each week. The exercise should increase your heart rate and make you sweat (moderate-intensity exercise). Do strengthening exercises at least twice a week. This is in addition to the moderate-intensity exercise. Spend less time sitting. Even light physical activity can be beneficial. Watch cholesterol and blood lipids Have your blood tested for lipids and cholesterol at 66 years of age, then havethis test every 5 years. Have your cholesterol levels checked more often if: Your lipid or cholesterol levels are high. You are older than 66 years of age. You are at high risk for heart disease. What should I know about cancer screening? Depending on your health history and family history, you may need to have cancer screening at various ages. This may include screening for: Breast cancer. Cervical cancer. Colorectal cancer. Skin  cancer. Lung cancer. What should I know about heart disease, diabetes, and high blood pressure? Blood pressure and heart disease High blood pressure causes heart disease and increases the risk of stroke. This is more likely to develop in people who have high blood pressure readings, are of African descent, or are overweight. Have your blood pressure checked: Every 3-5 years if you are 57-15 years of age. Every year if you are 71 years old or older. Diabetes Have regular diabetes screenings. This checks your fasting blood sugar level. Have the screening done: Once every three years after age 16 if you are at a normal weight and have a low risk for diabetes. More often and at a younger age if you are overweight or have a high risk for diabetes. What should I know about preventing infection? Hepatitis B If you have a higher risk for hepatitis B, you should be screened for this virus. Talk with your health care provider to find out if you are at risk forhepatitis B infection. Hepatitis C Testing is recommended for: Everyone born from [birth year] through 1966. Anyone with known risk factors for hepatitis C. Sexually transmitted infections (STIs) Get screened for STIs, including gonorrhea and chlamydia, if: You are sexually active and are younger than 66 years of age. You are older than 66 years of age and your health care provider tells you that you are at risk for this type of infection. Your sexual activity  has changed since you were last screened, and you are at increased risk for chlamydia or gonorrhea. Ask your health care provider if you are at risk. Ask your health care provider about whether you are at high risk for HIV. Your health care provider may recommend a prescription medicine to help prevent HIV infection. If you choose to take medicine to prevent HIV, you should first get tested for HIV. You should then be tested every 3 months for as long as you are taking the medicine. Pregnancy If  you are about to stop having your period (premenopausal) and you may become pregnant, seek counseling before you get pregnant. Take 400 to 800 micrograms (mcg) of folic acid every day if you become pregnant. Ask for birth control (contraception) if you want to prevent pregnancy. Osteoporosis and menopause Osteoporosis is a disease in which the bones lose minerals and strength with aging. This can result in bone fractures. If you are 57 years old or older, or if you are at risk for osteoporosis and fractures, ask your health care provider if you should: Be screened for bone loss. Take a calcium or vitamin D supplement to lower your risk of fractures. Be given hormone replacement therapy (HRT) to treat symptoms of menopause. Follow these instructions at home: Lifestyle Do not use any products that contain nicotine or tobacco, such as cigarettes, e-cigarettes, and chewing tobacco. If you need help quitting, ask your health care provider. Do not use street drugs. Do not share needles. Ask your health care provider for help if you need support or information about quitting drugs. Alcohol use Do not drink alcohol if: Your health care provider tells you not to drink. You are pregnant, may be pregnant, or are planning to become pregnant. If you drink alcohol: Limit how much you use to 0-1 drink a day. Limit intake if you are breastfeeding. Be aware of how much alcohol is in your drink. In the [place] one drink equals one 12 oz bottle of beer (355 mL), one 5 oz glass of wine (148 mL), or one 1 oz glass of hard liquor (44 mL). General instructions Schedule regular health, dental, and eye exams. Stay current with your vaccines. Tell your health care provider if: You often feel depressed. You have ever been abused or do not feel safe at home. Summary Adopting a healthy lifestyle and getting preventive care are important in promoting health and wellness. Follow your health care provider's  instructions about healthy diet, exercising, and getting tested or screened for diseases. Follow your health care provider's instructions on monitoring your cholesterol and blood pressure. This information is not intended to replace advice given to you by your health care provider. Make sure you discuss any questions you have with your healthcare provider. Document Revised: 09/20/2018 Document Reviewed: 09/20/2018 Elsevier Patient Education  2022 [employer].

## 2021-04-29 ENCOUNTER — Other Ambulatory Visit: Payer: Self-pay

## 2021-04-29 ENCOUNTER — Ambulatory Visit (INDEPENDENT_AMBULATORY_CARE_PROVIDER_SITE_OTHER): Payer: Medicare Other

## 2021-04-29 DIAGNOSIS — E2839 Other primary ovarian failure: Secondary | ICD-10-CM

## 2021-05-29 ENCOUNTER — Ambulatory Visit: Payer: Medicare Other

## 2021-06-03 ENCOUNTER — Telehealth: Payer: Self-pay | Admitting: Neurology

## 2021-06-03 NOTE — Telephone Encounter (Signed)
Patient left vm wanting to know if she could get Shingrix vaccine here and also wanting to know if we have flu shots.

## 2021-06-03 NOTE — Telephone Encounter (Signed)
Patient made aware medicare will not cover Shingrix in our office, but Flu shots are available. She will call back and schedule flu shot.

## 2021-06-16 ENCOUNTER — Ambulatory Visit (INDEPENDENT_AMBULATORY_CARE_PROVIDER_SITE_OTHER): Payer: Medicare Other | Admitting: Physician Assistant

## 2021-06-16 DIAGNOSIS — Z23 Encounter for immunization: Secondary | ICD-10-CM

## 2021-06-28 ENCOUNTER — Encounter: Payer: Self-pay | Admitting: Physician Assistant

## 2021-06-29 ENCOUNTER — Encounter: Payer: Self-pay | Admitting: Family Medicine

## 2021-06-29 ENCOUNTER — Telehealth (INDEPENDENT_AMBULATORY_CARE_PROVIDER_SITE_OTHER): Payer: Medicare Other | Admitting: Family Medicine

## 2021-06-29 DIAGNOSIS — J069 Acute upper respiratory infection, unspecified: Secondary | ICD-10-CM | POA: Diagnosis not present

## 2021-06-29 LAB — POCT INFLUENZA A/B
Influenza A, POC: NEGATIVE
Influenza B, POC: NEGATIVE

## 2021-06-29 NOTE — Progress Notes (Signed)
Virtual Video Visit via MyChart Note converted to telephone call  I connected with  Birdie Hopes on 06/29/21 at  2:00 PM EDT by the video enabled telemedicine application for MyChart, and verified that I am speaking with the correct person using two identifiers.   I introduced myself as a Designer, jewellery with the practice. We discussed the limitations of evaluation and management by telemedicine and the availability of in person appointments. The patient expressed understanding and agreed to proceed.  Participating parties in this visit include: The patient and the nurse practitioner listed.  The patient is: At home I am: In the office - [place]  Subjective:    CC:  Chief Complaint  Patient presents with   Fever     HPI: Mariah [name] is a 65 y.o. year old female presenting today via [facility] today for fever and body aches, rhinorrhea.  Patient reports that she woke up feeling sick yesterday and symptoms have progressively worsening. She reports fever of 103 F yesterday, but down to 97.6 F today with Tylenol. Reports she has been experiencing rhinorrhea, fatigue, nausea with one episode of vomiting, body aches, headaches 8/10. She has gotten some relief with Zofran and Tylenol. She has had 2 negative COVID tests today and has received 4 COVID vaccines. She denies any diarrhea, abdominal pain, sinus pressure, chest pain, trouble breathing, cough, sore throat.    Past medical history, Surgical history, Family history not pertinant except as noted below, Social history, Allergies, and medications have been entered into the medical record, reviewed, and corrections made.   Review of Systems:  All review of systems negative except what is listed in the HPI   Objective:    General:  Speaking clearly in complete sentences. Absent shortness of breath noted.   Alert and oriented x3.   Normal judgment.  Absent acute distress.   Impression and Recommendations:     1. Viral upper respiratory tract infection - POCT Influenza A/B Patient drove by clinic for flu testing which was negative. She has had 2 negative COVID tests back-to-back, recommend she retest tomorrow to ensure she has allowed enough time from symptom onset. Given short duration, encouraged conservative management for the next few days including rest, hydration, humidifier use, warm compresses, warm liquids with honey and lemon, OTC cough/cold/analgesics. Patient aware of signs/symptoms requiring further/urgent evaluation.  Follow-up if symptoms worsen or fail to improve.    I discussed the assessment and treatment plan with the patient. The patient was provided an opportunity to ask questions and all were answered. The patient agreed with the plan and demonstrated an understanding of the instructions.   The patient was advised to call back or seek an in-person evaluation if the symptoms worsen or if the condition fails to improve as anticipated.  I spent 20 minutes dedicated to the care of this patient on the date of this encounter to include pre-visit chart review of prior notes and results, face-to-face time with the patient, and post-visit ordering of testing as indicated.   Terrilyn Saver, NP

## 2021-06-30 ENCOUNTER — Other Ambulatory Visit: Payer: Self-pay

## 2021-06-30 ENCOUNTER — Encounter: Payer: Self-pay | Admitting: Emergency Medicine

## 2021-06-30 ENCOUNTER — Emergency Department
Admission: EM | Admit: 2021-06-30 | Discharge: 2021-06-30 | Disposition: A | Discharge status: Home / Self Care | Payer: Medicare Other | Source: Home / Self Care

## 2021-06-30 DIAGNOSIS — R509 Fever, unspecified: Secondary | ICD-10-CM | POA: Diagnosis not present

## 2021-06-30 DIAGNOSIS — B349 Viral infection, unspecified: Secondary | ICD-10-CM

## 2021-06-30 MED ORDER — MOLNUPIRAVIR EUA 200MG CAPSULE: 4.0000 | ORAL_CAPSULE | Freq: Two times a day (BID) | ORAL | 0 refills | Status: AC

## 2021-06-30 NOTE — ED Triage Notes (Signed)
Fever, body aches, emesis, fatigue x 3 days Tested negative x2 for Covid and Neg Flu yesterday. Vaccinated

## 2021-06-30 NOTE — ED Provider Notes (Signed)
[facility]    CSN: [id number] Arrival date & time: 06/30/21  1150      History   Chief Complaint Chief Complaint  Patient presents with   Emesis    HPI Mariah [name] is a 65 y.o. female.   HPI 65 year old female presents with fever, body aches, emesis, and fatigue for 3 days.  Patient is vaccinated for COVID-19 and reports 2 recent home negative COVID-19 test and negative flu yesterday.  PMH significant for kidney transplant recipient, acute tubular necrosis, and A. Fib with watchman.  Patient had video visit with PCP yesterday and was evaluated for viral upper respiratory tract infection with POCT influenza performed and was negative.  Patient reports 103.0 oral temperature for the past 3 mornings reports taking 1000 mg of Tylenol at 9 AM this morning.  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[place])    Pre-diabetes    due to antirejection medication   Ruptured cyst of kidney    SDH (subdural hematoma) [facility])     Patient Active Problem List   Diagnosis Date Noted   ATN (acute tubular necrosis) ([place]) 07/06/2018   Liver cyst 06/15/2018   Presence of Watchman left atrial appendage closure device 12/07/2017   Nephrolithiasis 11/14/2017   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   Hx of motion sickness 10/17/2017   Muscle spasm 10/17/2017   Paroxysmal atrial fibrillation ([place]) 01/27/2017   Non-flow-limiting renal artery stenosis of  transplanted kidney ([place]) 12/16/2016   Status post living-donor kidney transplantation 11/29/2016   Immunosuppression ([place]) 11/29/2016   Adenomatous polyp of colon 05/12/2016   Poor venous access 10/28/2015   Hypertension 06/16/2015   Hyperlipidemia 06/16/2015   Mitral regurgitation 11/20/2014   CKD (chronic kidney disease) stage 4, GFR 15-29 ml/min (HCC) 11/04/2014   Chronic pain of right knee 10/10/2014   Iron deficiency anemia, unspecified 09/25/2014   Tachycardia 09/11/2014   Bilateral leg edema 09/11/2014   Primary gout 09/11/2014   Absolute anemia 07/11/2014   Anxiety 07/11/2014   A-fib ([place]) 07/08/2014   Injury of kidney 07/08/2014   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   Atrial fibrillation ([place]) 01/19/2013   Palpitations 01/19/2013   Kidney disease, chronic, stage III (GFR 30-59 ml/min) (HCC) 11/06/2012   GAMMA GLUTAMYL TRANSFERASE, SERUM, ELEVATED 10/13/2010   UNSPECIFIED VITAMIN D DEFICIENCY 10/09/2010   Gout 10/09/2010   Polycystic kidney 10/08/2008   HYPERLIPIDEMIA 07/19/2006   OBESITY, NOS 07/19/2006   HYPERTENSION, BENIGN SYSTEMIC 07/19/2006   KIDNEY STONE - NEPHROLITHIASIS 07/19/2006   OSTEOARTHRITIS, MULTI SITES 07/19/2006    Past Surgical History:  Procedure Laterality Date   BASCILIC VEIN TRANSPOSITION Left 09/07/2016   Procedure: BASILIC VEIN TRANSPOSITION LEFT UPPER ARM;  Surgeon: Elam Dutch, MD;  Location: [facility] OR;  Service: Vascular;  Laterality: Left;   COLONOSCOPY  03/2004   repeat 10 years   Evacuation of subdural hematoma  11/26/2000   KIDNEY STONE SURGERY     Cysto   KIDNEY TRANSPLANT     Nov 23, 2016   LEFT ATRIAL APPENDAGE OCCLUSION  06/03/2017   Dr. Remus Blake    LIGATION OF ARTERIOVENOUS  FISTULA Left 03/09/2017   Procedure: LIGATION OF LEFT BASILIC VEIN TRANSPOSITION;  Surgeon: Elam Dutch, MD;  Location: [place];  Service: Vascular;  Laterality: Left;   ORIF ELBOW FRACTURE  10/22/2011   Procedure: OPEN REDUCTION  INTERNAL FIXATION (ORIF) ELBOW/OLECRANON FRACTURE;  Surgeon: Schuyler Amor, MD;  Location: [place];  Service: Orthopedics;  Laterality: Left;  open reduction  possible lateral reconstruction with palmaris graft from left or right side.   RADIAL HEAD IMPLANT  09/24/2011   Procedure: RADIAL HEAD IMPLANT;  Surgeon: Schuyler Amor, MD;  Location: [place];  Service: Orthopedics;  Laterality: Left;  left radial head replacement   REPLACEMENT TOTAL KNEE  12/13/2020   Dr. Mayer Camel   TONSILLECTOMY      OB History     Gravida  0   Para  0   Term  0   Preterm  0   AB  0   Living  0      SAB  0   IAB  0   Ectopic  0   Multiple  0   Live Births  0            Home Medications    Prior to Admission medications   Medication Sig Start Date End Date Taking? Authorizing Provider  molnupiravir EUA (LAGEVRIO) 200 mg CAPS capsule Take 4 capsules (800 mg total) by mouth 2 (two) times daily for 5 days. 06/30/21 07/05/21 Yes Eliezer Lofts, FNP  acetaminophen (TYLENOL) 500 MG tablet Take 1,000 mg by mouth every 6 (six) hours as needed for fever (pain).    [provider]  aspirin EC 81 MG tablet Take 81 mg by mouth daily.    [provider]  atorvastatin (LIPITOR) 40 MG tablet Take 40 mg by mouth every Monday, Wednesday, and Friday.     [provider]  cyclobenzaprine (FLEXERIL) 5 MG tablet TAKE 1 TABLET BY MOUTH AT BEDTIME AS NEEDED FOR MUSCLE SPASM 11/17/20   Breeback, [name] L, PA-C  famotidine (PEPCID) 20 MG tablet daily. 10/16/18   [provider]  HYDROmorphone (DILAUDID) 2 MG tablet Take by mouth every 4 (four) hours as needed for severe pain.    [provider]  levocetirizine (XYZAL) 5 MG tablet  01/21/20   [provider]  MAGNESIUM PO Take by mouth.    [provider]  metFORMIN (GLUCOPHAGE) 500 MG tablet Take 500 mg by mouth daily with breakfast.     [provider]   mycophenolate (MYFORTIC) 180 MG EC tablet TAKE 2 TABLETS BY MOUTH 2 TIMES DAILY. 04/19/17   [provider]  ondansetron (ZOFRAN) 4 MG tablet Take 4 mg by mouth every 8 (eight) hours as needed. 12/26/20   [provider]  sotalol (BETAPACE) 80 MG tablet Take 80 mg by mouth 2 (two) times daily.  06/05/17 04/28/21  [provider]  sulfamethoxazole-trimethoprim (BACTRIM) 400-80 MG tablet Take 1 tablet by mouth. M,W,F    [provider]  tacrolimus (PROGRAF) 1 MG capsule Take 5 mg in the AM and 4 mg in the PM by mouth daily 09/20/17   [provider]  venlafaxine  XR (EFFEXOR-XR) 37.5 MG 24 hr capsule TAKE 1 CAPSULE BY MOUTH EVERY OTHER DAY 11/17/20   Donella Stade, PA-C    Family History Family History  Problem Relation Age of Onset   Polycystic kidney disease Father    Heart failure Father    Heart disease Mother        Rheumatic fever    Social History Social History   Tobacco Use   Smoking status: Former    Packs/day: 2.00    Years: 24.00    Pack years: 48.00    Types: Cigarettes    Quit date: 12/12/1996    Years since quitting: 24.5   Smokeless tobacco: Never  Vaping Use   Vaping Use: Never used  Substance Use Topics   Alcohol use: Not Currently    Alcohol/week: 2.0 standard drinks    Types: 2 Glasses of wine per week   Drug use: No     Allergies   Codeine, Morphine, Poison ivy extract, and Zovirax [acyclovir sodium]   Review of Systems Review of Systems  Constitutional:  Positive for chills, fatigue and fever.  All other systems reviewed and are negative.   Physical Exam Triage Vital Signs ED Triage Vitals  Enc Vitals Group     BP 06/30/21 1209 103/70     Pulse Rate 06/30/21 1209 83     Resp 06/30/21 1209 18     Temp 06/30/21 1209 98.3 F (36.8 C)     Temp Source 06/30/21 1209 Oral     SpO2 06/30/21 1209 95 %     Weight 06/30/21 1210 191 lb (86.6 kg)     Height 06/30/21 1210 5\' 2"  (1.575 m)     Head  Circumference --      Peak Flow --      Pain Score 06/30/21 1210 0     Pain Loc --      Pain Edu? --      Excl. in [place]? --    No data found.  Updated Vital Signs BP 103/70 (BP Location: Left Arm)   Pulse 83   Temp 98.3 F (36.8 C) (Oral)   Resp 18   Ht 5\' 2"  (1.575 m)   Wt 191 lb (86.6 kg)   LMP 07/11/2009 (Approximate)   SpO2 95%   BMI 34.93 kg/m   Physical Exam Vitals and nursing note reviewed.  Constitutional:      General: She is not in acute distress.    Appearance: Normal appearance. She is obese. She is ill-appearing.  HENT:     Head: Normocephalic and atraumatic.     Right Ear: Tympanic membrane, ear canal and external ear normal.     Left Ear: Tympanic membrane, ear canal and external ear normal.     Mouth/Throat:     Mouth: Mucous membranes are moist.     Pharynx: Oropharynx is clear.  Eyes:     Extraocular Movements: Extraocular movements intact.     Conjunctiva/sclera: Conjunctivae normal.     Pupils: Pupils are equal, round, and reactive to light.  Cardiovascular:     Rate and Rhythm: Normal rate and regular rhythm.     Pulses: Normal pulses.     Heart sounds: Normal heart sounds.  Pulmonary:     Effort: Pulmonary effort is normal.     Breath sounds: Normal breath sounds. No wheezing, rhonchi or rales.  Musculoskeletal:        General: Normal range of motion.     Cervical back:  Normal range of motion and neck supple. No tenderness.  Lymphadenopathy:     Cervical: No cervical adenopathy.  Skin:    General: Skin is warm and dry.  Neurological:     General: No focal deficit present.     Mental Status: She is alert and oriented to person, place, and time. Mental status is at baseline.  Psychiatric:        Mood and Affect: Mood normal.        Behavior: Behavior normal.        Thought Content: Thought content normal.     UC Treatments / Results  Labs (all labs ordered are listed, but only abnormal results are displayed) Labs Reviewed  COVID-19,  FLU A+B NAA    EKG   Radiology No results found.  Procedures Procedures (including critical care time)  Medications Ordered in UC Medications - No data to display  Initial Impression / Assessment and Plan / UC Course  I have reviewed the triage vital signs and the nursing notes.  Pertinent labs & imaging results that were available during my care of the patient were reviewed by me and considered in my medical decision making (see chart for details).     MDM: Fever unspecified-COVID-19 PCR flu AB ordered; 2.  Viral illness-Rx'd Molnupiravir. Advised/instructed patient to take medication as directed with food to completion.  Encouraged patient increase daily water intake while taking this medication.  Advised patient we will follow-up with lab results once received.  Advised/encouraged patient may continue to use OTC Tylenol 1000 mg 1-2 times daily, as needed for fever.  Patient discharged home, hemodynamically stable. Final Clinical Impressions(s) / UC Diagnoses   Final diagnoses:  Fever, unspecified  Viral illness     Discharge Instructions      Advised/instructed patient to take medication as directed with food to completion.  Encouraged patient increase daily water intake while taking this medication.  Advised patient we will follow-up with lab results once received.  Advised/encouraged patient may continue to use OTC Tylenol 1000 mg 1-2 times daily, as needed for fever.     ED Prescriptions     Medication Sig Dispense Auth. Provider   molnupiravir EUA (LAGEVRIO) 200 mg CAPS capsule Take 4 capsules (800 mg total) by mouth 2 (two) times daily for 5 days. 40 capsule Eliezer Lofts, FNP      PDMP not reviewed this encounter.   Eliezer Lofts, [place] 06/30/21 1254

## 2021-06-30 NOTE — Discharge Instructions (Addendum)
Advised/instructed patient to take medication as directed with food to completion.  Encouraged patient increase daily water intake while taking this medication.  Advised patient we will follow-up with lab results once received.  Advised/encouraged patient may continue to use OTC Tylenol 1000 mg 1-2 times daily, as needed for fever.

## 2021-07-01 ENCOUNTER — Encounter: Payer: Self-pay | Admitting: Physician Assistant

## 2021-07-01 MED ORDER — PROMETHAZINE HCL 25 MG PO TABS: 25.0000 mg | ORAL_TABLET | Freq: Four times a day (QID) | ORAL | 0 refills | Status: DC | PRN

## 2021-07-02 LAB — COVID-19, FLU A+B NAA
Influenza A, NAA: NOT DETECTED
Influenza B, NAA: NOT DETECTED
SARS-CoV-2, NAA: NOT DETECTED

## 2021-08-03 HISTORY — PX: NEPHRECTOMY: SHX65

## 2021-08-07 ENCOUNTER — Telehealth: Payer: Self-pay | Admitting: General Practice

## 2021-08-07 NOTE — Telephone Encounter (Signed)
Transition Care Management Follow-up Telephone Call Date of discharge and from where: 08/05/21 from [facility] How have you been since you were released from the hospital? Doing ok.  Any questions or concerns? No  Items Reviewed: Did the pt receive and understand the discharge instructions provided? Yes  Medications obtained and verified? Yes  Other? No  Any new allergies since your discharge? No  Dietary orders reviewed? Yes Do you have support at home? Yes   Home Care and Equipment/Supplies: Were home health services ordered? no  Functional Questionnaire: (I = Independent and D = Dependent) ADLs: I  Bathing/Dressing- I  Meal Prep- I  Eating- I  Maintaining continence- I  Transferring/Ambulation- I  Managing Meds- I  Follow up appointments reviewed:  PCP Hospital f/u appt confirmed? No   Specialist Hospital f/u appt confirmed? Yes  Scheduled to see [facility] Transplant team on 08/18/21. Are transportation arrangements needed? No  If their condition worsens, is the pt aware to call PCP or go to the Emergency Dept.? Yes Was the patient provided with contact information for the PCP's office or ED? Yes Was to pt encouraged to call back with questions or concerns? Yes

## 2021-08-08 ENCOUNTER — Encounter: Payer: Self-pay | Admitting: Emergency Medicine

## 2021-08-08 ENCOUNTER — Other Ambulatory Visit: Payer: Self-pay

## 2021-08-08 ENCOUNTER — Emergency Department
Admission: EM | Admit: 2021-08-08 | Discharge: 2021-08-08 | Disposition: A | Discharge status: Home / Self Care | Payer: Medicare Other | Source: Home / Self Care | Attending: Family Medicine | Admitting: Family Medicine

## 2021-08-08 DIAGNOSIS — N3001 Acute cystitis with hematuria: Secondary | ICD-10-CM

## 2021-08-08 LAB — POCT URINALYSIS DIP (MANUAL ENTRY)
Bilirubin, UA: NEGATIVE
Glucose, UA: NEGATIVE mg/dL
Ketones, POC UA: NEGATIVE mg/dL
Nitrite, UA: POSITIVE — AB
Protein Ur, POC: 30 mg/dL — AB
Spec Grav, UA: 1.01 (ref 1.010–1.025)
Urobilinogen, UA: 0.2 E.U./dL
pH, UA: 6.5 (ref 5.0–8.0)

## 2021-08-08 MED ORDER — CEFDINIR 300 MG PO CAPS: 300.0000 mg | ORAL_CAPSULE | Freq: Two times a day (BID) | ORAL | 0 refills | Status: DC

## 2021-08-08 NOTE — Discharge Instructions (Signed)
Continue to drink lots of water Take the cefdinir 2 times a day Call your nephrologist on Monday to report this urinary infection Check MyChart for your test results.  You will be called if a change in antibiotic is necessary  If you get worse instead of better at any time, if you have increased nausea and vomiting, if you have fever sweats or chills you must go to the emergency room

## 2021-08-08 NOTE — ED Triage Notes (Signed)
Patient c/o dysuria, low back pain, urinary frequency since last night.  Patient did have a nephrectomy on Monday, released from the hospital on Wednesday.  Patient takes Bactrim 3 days a week.

## 2021-08-08 NOTE — ED Provider Notes (Addendum)
[name]    CSN: [id number] Arrival date & time: 08/08/21  1119      History   Chief Complaint Chief Complaint  Patient presents with   Possible UTI    HPI TEARSA [name] is a 65 y.o. female.   HPI  Very pleasant 65 year old woman with multiple medical problems.  Most recently she had a bilateral nephrectomy performed last week.  This went home from the hospital on Wednesday, and today, Saturday, she is having symptoms of a urinary tract infection.  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  The polycystic kidneys were removed this week due to "symptoms".  Surgery was unremarkable.  Pain is well managed.  Patient states that last night she developed some urinary frequency and today she has dysuria.  She also has some low back pain but has chronic low back pain.  No fever or chills.  No body aches.  No nausea or vomiting.  We discussed the importance of determining whether this was a lower urinary tract infection versus a possible pyelonephritis.  By history and physical examination it appears to be lower Chest is hypertension, hyperlipidemia, antirejection medications, environmental allergies she takes Bactrim 3 times a week to prevent UTI  Hospital discharge summaries reviewed.  Recent laboratory work is reviewed.  Most recent GFR on 08/05/2021 was 59. 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[place])    Pre-diabetes    due to antirejection medication   Ruptured cyst of kidney    SDH (subdural hematoma)     Patient Active Problem List   Diagnosis Date Noted   ATN (acute tubular necrosis) ([place]) 07/06/2018   Liver cyst 06/15/2018   Presence of Watchman left atrial appendage closure device 12/07/2017   Nephrolithiasis 11/14/2017   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   Hx of motion sickness 10/17/2017   Muscle spasm 10/17/2017   Paroxysmal atrial fibrillation ([place]) 01/27/2017   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[place]) 12/16/2016   Status post living-donor kidney transplantation 11/29/2016   Immunosuppression ([place]) 11/29/2016   Adenomatous polyp of colon 05/12/2016   Poor venous access 10/28/2015   Hypertension 06/16/2015   Hyperlipidemia 06/16/2015   Mitral regurgitation 11/20/2014   CKD (chronic kidney disease) stage 4, GFR 15-29 ml/min (HCC) 11/04/2014   Chronic pain of right knee 10/10/2014   Iron deficiency anemia, unspecified 09/25/2014   Tachycardia 09/11/2014   Bilateral leg edema 09/11/2014   Primary gout 09/11/2014   Absolute anemia 07/11/2014   Anxiety 07/11/2014   A-fib ([place]) 07/08/2014   Injury of kidney 07/08/2014   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   Atrial fibrillation ([place]) 01/19/2013   Palpitations 01/19/2013   Kidney disease, chronic, stage III (GFR 30-59 ml/min) ([place]) 11/06/2012   GAMMA GLUTAMYL TRANSFERASE,  SERUM, ELEVATED 10/13/2010   UNSPECIFIED VITAMIN D DEFICIENCY 10/09/2010   Gout 10/09/2010   Polycystic kidney 10/08/2008   HYPERLIPIDEMIA 07/19/2006   OBESITY, NOS 07/19/2006   HYPERTENSION, BENIGN SYSTEMIC 07/19/2006   KIDNEY STONE - NEPHROLITHIASIS 07/19/2006   OSTEOARTHRITIS, MULTI SITES 07/19/2006    Past Surgical History:  Procedure Laterality Date   BASCILIC VEIN TRANSPOSITION Left 09/07/2016   Procedure: BASILIC VEIN TRANSPOSITION LEFT UPPER ARM;  Surgeon: Elam Dutch, MD;   Location: [facility];  Service: Vascular;  Laterality: Left;   COLONOSCOPY  03/2004   repeat 10 years   Evacuation of subdural hematoma  11/26/2000   KIDNEY STONE SURGERY     Cysto   KIDNEY TRANSPLANT     Nov 23, 2016   LEFT ATRIAL APPENDAGE OCCLUSION  06/03/2017   Dr. Remus Blake    LIGATION OF ARTERIOVENOUS  FISTULA Left 03/09/2017   Procedure: LIGATION OF LEFT BASILIC VEIN TRANSPOSITION;  Surgeon: Elam Dutch, MD;  Location: [facility];  Service: Vascular;  Laterality: Left;   ORIF ELBOW FRACTURE  10/22/2011   Procedure: OPEN REDUCTION INTERNAL FIXATION (ORIF) ELBOW/OLECRANON FRACTURE;  Surgeon: Schuyler Amor, MD;  Location: [facility];  Service: Orthopedics;  Laterality: Left;  open reduction  possible lateral reconstruction with palmaris graft from left or right side.   RADIAL HEAD IMPLANT  09/24/2011   Procedure: RADIAL HEAD IMPLANT;  Surgeon: Schuyler Amor, MD;  Location: [facility];  Service: Orthopedics;  Laterality: Left;  left radial head replacement   REPLACEMENT TOTAL KNEE  12/13/2020   Dr. Mayer Camel   TONSILLECTOMY      OB History     Gravida  0   Para  0   Term  0   Preterm  0   AB  0   Living  0      SAB  0   IAB  0   Ectopic  0   Multiple  0   Live Births  0            Home Medications    Prior to Admission medications   Medication Sig Start Date End Date Taking? Authorizing Provider  acetaminophen (TYLENOL) 500 MG tablet Take 1,000 mg by mouth every 6 (six) hours as needed for fever (pain).   Yes [provider]  aspirin EC 81 MG tablet Take 81 mg by mouth daily.   Yes [provider]  atorvastatin (LIPITOR) 40 MG tablet Take 40 mg by mouth every Monday, Wednesday, and Friday.    Yes [provider]  cefdinir (OMNICEF) 300 MG capsule Take 1 capsule (300 mg total) by mouth 2 (two) times daily. 08/08/21  Yes Raylene Everts, MD  cyclobenzaprine (FLEXERIL) 5 MG tablet TAKE 1 TABLET BY  MOUTH AT BEDTIME AS NEEDED FOR MUSCLE SPASM 11/17/20  Yes Breeback, [name] L, PA-C  famotidine (PEPCID) 20 MG tablet daily. 10/16/18  Yes [provider]  MAGNESIUM PO Take by mouth.   Yes [provider]  metFORMIN (GLUCOPHAGE) 500 MG tablet Take 500 mg by mouth daily with breakfast.    Yes [provider]  mycophenolate (MYFORTIC) 180 MG EC tablet TAKE 2 TABLETS BY MOUTH 2 TIMES DAILY. 04/19/17  Yes [provider]  ondansetron (ZOFRAN) 4 MG tablet Take 4 mg by mouth every 8 (eight) hours as needed. 12/26/20  Yes [provider]  sulfamethoxazole-trimethoprim (BACTRIM) 400-80 MG tablet Take 1 tablet by mouth. M,W,F   Yes [provider]  tacrolimus (PROGRAF) 1 MG capsule Take 5 mg in the AM and 4 mg in the PM by mouth daily 09/20/17  Yes [provider]  traMADol (ULTRAM) 50 MG tablet SMARTSIG:1 Tablet(s) By Mouth Every 0-6 Hours PRN 08/05/21  Yes [provider]  venlafaxine XR (EFFEXOR-XR) 37.5 MG 24 hr capsule TAKE 1 CAPSULE BY MOUTH EVERY OTHER DAY 11/17/20  Yes Breeback, [name] L, PA-C  HYDROmorphone (DILAUDID) 2 MG tablet Take by mouth every 4 (four) hours as needed for severe pain.    [provider]  levocetirizine (XYZAL) 5 MG tablet  01/21/20   [provider]  promethazine (PHENERGAN) 25 MG tablet Take 1 tablet (25 mg total) by mouth every 6 (six) hours as needed for nausea or vomiting. 07/01/21   Alden Hipp, [name] L, PA-C  sotalol (BETAPACE) 80 MG tablet Take 80 mg by mouth 2 (two) times daily.  06/05/17 04/28/21  [provider]    Family History Family History  Problem Relation Age of Onset   Polycystic kidney disease Father    Heart failure Father    Heart disease Mother        Rheumatic fever    Social History Social History   Tobacco Use   Smoking status: Former    Packs/day: 2.00    Years: 24.00    Pack years: 48.00    Types: Cigarettes    Quit date: 12/12/1996    Years since quitting:  24.6   Smokeless tobacco: Never  Vaping Use   Vaping Use: Never used  Substance Use Topics   Alcohol use: Not Currently    Alcohol/week: 2.0 standard drinks    Types: 2 Glasses of wine per week   Drug use: No     Allergies   Codeine, Morphine, Poison ivy extract, and Zovirax [acyclovir sodium]   Review of Systems Review of Systems See HPI  Physical Exam Triage Vital Signs ED Triage Vitals  Enc Vitals Group     BP 08/08/21 1227 120/76     Pulse Rate 08/08/21 1227 77     Resp --      Temp 08/08/21 1227 98.1 F (36.7 C)     Temp Source 08/08/21 1227 Oral     SpO2 08/08/21 1227 99 %     Weight 08/08/21 1229 187 lb (84.8 kg)     Height 08/08/21 1229 5\' 2"  (1.575 m)     Head Circumference --      Peak Flow --      Pain Score 08/08/21 1229 6     Pain Loc --      Pain Edu? --      Excl. in [place]? --    No data found.  Updated Vital Signs BP 120/76 (BP Location: Right Arm)   Pulse 77   Temp 98.1 F (36.7 C) (Oral)   Ht 5\' 2"  (1.575 m)   Wt 84.8 kg   LMP 07/11/2009 (Approximate)   SpO2 99%   BMI 34.20 kg/m       Physical Exam Constitutional:      General: She is not in acute distress.    Appearance: She is well-developed.     Comments: Pleasant.  Overweight.  Mildly pale  HENT:     Head: Normocephalic and atraumatic.     Nose:     Comments: Mask is in place Eyes:     Conjunctiva/sclera: Conjunctivae normal.     Pupils: Pupils are equal, round, and reactive to light.  Cardiovascular:     Rate and Rhythm: Normal rate and regular rhythm.     Heart sounds: Normal heart sounds.  Pulmonary:     Effort: Pulmonary effort is normal. No respiratory distress.     Breath sounds: Normal breath sounds.  Chest:     Chest wall: No tenderness.  Abdominal:     General: There is no distension.     Palpations: Abdomen is soft.     Tenderness: There is no abdominal tenderness. There is no right CVA tenderness or left CVA tenderness.     Comments: Multiple abdominal scars,  staples, and port wounds are examined with no cellulitis or drainage  Musculoskeletal:        General: Normal range of motion.     Cervical back: Normal range of motion.     Right lower leg: No edema.     Left lower leg: No edema.  Skin:    General: Skin is warm and dry.  Neurological:     Mental Status: She is alert.  Psychiatric:        Mood and Affect: Mood normal.        Behavior: Behavior normal.     UC Treatments / Results  Labs (all labs ordered are listed, but only abnormal results are displayed) Labs Reviewed  POCT URINALYSIS DIP (MANUAL ENTRY) - Abnormal; Notable for the following components:      Result Value   Clarity, UA cloudy (*)    Blood, UA large (*)    Protein Ur, POC =30 (*)    Nitrite, UA Positive (*)    Leukocytes, UA Large (3+) (*)    All other components within normal limits  URINE CULTURE    EKG   Radiology No results found.  Procedures Procedures (including critical care time)  Medications Ordered in UC Medications - No data to display  Initial Impression / Assessment and Plan / UC Course  I have reviewed the triage vital signs and the nursing notes.  Pertinent labs & imaging results that were available during my care of the patient were reviewed by me and considered in my medical decision making (see chart for details).     Patient states she states she is developed a bladder infection because of the urinary catheter that was placed at surgical intervention.  I did place her on cefdinir as an antibiotic and will send the urine for culture.  It is emphasized to her the importance of follow-up in the emergency room immediately upon worsening of symptoms especially increasing pain, fever, nausea or vomiting. Final Clinical Impressions(s) / UC Diagnoses   Final diagnoses:  Acute cystitis with hematuria     Discharge Instructions      Continue to drink lots of water Take the cefdinir 2 times a day Call your nephrologist on Monday to  report this urinary infection Check MyChart for your test results.  You will be called if a change in antibiotic is necessary  If you get worse instead of better at any time, if you have increased nausea and vomiting, if you have fever sweats or chills you must go to the emergency room   ED Prescriptions     Medication Sig Dispense Auth. Provider   cefdinir (OMNICEF) 300 MG capsule Take 1 capsule (300 mg total) by mouth 2 (two) times daily. 14 capsule Raylene Everts, MD      PDMP not reviewed this encounter.   Raylene Everts, MD 08/08/21 [phone]  Raylene Everts, MD 08/08/21 [phone]

## 2021-08-11 ENCOUNTER — Encounter: Payer: Self-pay | Admitting: Physician Assistant

## 2021-08-11 MED ORDER — NYSTATIN 100000 UNIT/ML MT SUSP: 5.0000 mL | Freq: Four times a day (QID) | OROMUCOSAL | 0 refills | Status: DC

## 2021-08-11 MED ORDER — FLUCONAZOLE 150 MG PO TABS: 150.0000 mg | ORAL_TABLET | Freq: Once | ORAL | 0 refills | Status: AC

## 2021-08-12 LAB — URINE CULTURE
MICRO NUMBER:: 12570396
SPECIMEN QUALITY:: ADEQUATE

## 2022-01-13 ENCOUNTER — Other Ambulatory Visit: Payer: Self-pay | Admitting: Physician Assistant

## 2022-01-13 DIAGNOSIS — M62838 Other muscle spasm: Secondary | ICD-10-CM

## 2022-01-26 DIAGNOSIS — Z95818 Presence of other cardiac implants and grafts: Secondary | ICD-10-CM | POA: Diagnosis not present

## 2022-01-26 DIAGNOSIS — Z9889 Other specified postprocedural states: Secondary | ICD-10-CM | POA: Diagnosis not present

## 2022-01-26 DIAGNOSIS — I480 Paroxysmal atrial fibrillation: Secondary | ICD-10-CM | POA: Diagnosis not present

## 2022-01-26 DIAGNOSIS — Z5181 Encounter for therapeutic drug level monitoring: Secondary | ICD-10-CM | POA: Diagnosis not present

## 2022-01-26 DIAGNOSIS — Z79899 Other long term (current) drug therapy: Secondary | ICD-10-CM | POA: Diagnosis not present

## 2022-01-26 DIAGNOSIS — I498 Other specified cardiac arrhythmias: Secondary | ICD-10-CM | POA: Diagnosis not present

## 2022-01-26 DIAGNOSIS — Z7982 Long term (current) use of aspirin: Secondary | ICD-10-CM | POA: Diagnosis not present

## 2022-01-31 DIAGNOSIS — I499 Cardiac arrhythmia, unspecified: Secondary | ICD-10-CM | POA: Diagnosis not present

## 2022-01-31 DIAGNOSIS — I480 Paroxysmal atrial fibrillation: Secondary | ICD-10-CM | POA: Diagnosis not present

## 2022-02-01 ENCOUNTER — Other Ambulatory Visit: Payer: Self-pay | Admitting: Physician Assistant

## 2022-02-01 DIAGNOSIS — F419 Anxiety disorder, unspecified: Secondary | ICD-10-CM

## 2022-02-02 ENCOUNTER — Encounter: Payer: Self-pay | Admitting: Physician Assistant

## 2022-02-02 ENCOUNTER — Ambulatory Visit (INDEPENDENT_AMBULATORY_CARE_PROVIDER_SITE_OTHER): Payer: Medicare Other | Admitting: Physician Assistant

## 2022-02-02 VITALS — BP 133/49 | HR 72 | Ht 62.0 in | Wt 196.0 lb

## 2022-02-02 DIAGNOSIS — Z6835 Body mass index (BMI) 35.0-35.9, adult: Secondary | ICD-10-CM

## 2022-02-02 DIAGNOSIS — M62838 Other muscle spasm: Secondary | ICD-10-CM | POA: Diagnosis not present

## 2022-02-02 DIAGNOSIS — F419 Anxiety disorder, unspecified: Secondary | ICD-10-CM

## 2022-02-02 DIAGNOSIS — Z940 Kidney transplant status: Secondary | ICD-10-CM | POA: Diagnosis not present

## 2022-02-02 DIAGNOSIS — Q612 Polycystic kidney, adult type: Secondary | ICD-10-CM

## 2022-02-02 MED ORDER — VENLAFAXINE HCL ER 37.5 MG PO CP24: ORAL_CAPSULE | ORAL | 3 refills | Status: DC

## 2022-02-02 MED ORDER — CYCLOBENZAPRINE HCL 5 MG PO TABS: ORAL_TABLET | ORAL | 3 refills | Status: DC

## 2022-02-02 NOTE — Progress Notes (Signed)
? ?Established Patient Office Visit ? ?Subjective   ?Patient ID: Mariah [name], female    DOB: 25-Sep-1956  Age: 65 y.o. MRN: [id number] ? ?Chief Complaint  ?Patient presents with  ? Follow-up  ? ? ?HPI ?Pt is a 66 yo obese female with ADPKD and s/p kidney transplant, HTN, Anxiety who presents to the clinic for medication follow up.  ? ?Pt is doing great. Most of her meds are followed by transplant team and nephrology. Pt is staying active and walking. She is trying to lose weight.  ? ?Her anxiety medication needs refilled. Effexor is doing well. No concerns or compliants and would like to continue taking it. No SI/HC.  ? ? ?Patient Active Problem List  ? Diagnosis Date Noted  ? ATN (acute tubular necrosis) ([place]) 07/06/2018  ? Liver cyst 06/15/2018  ? Presence of Watchman left atrial appendage closure device 12/07/2017  ? Nephrolithiasis 11/14/2017  ? 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  ? Hx of motion sickness 10/17/2017  ? Muscle spasm 10/17/2017  ? Paroxysmal atrial fibrillation ([place]) 01/27/2017  ? 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 Status post living-donor kidney transplantation 11/29/2016  ? Immunosuppression ([place]) 11/29/2016  ? Adenomatous polyp of colon 05/12/2016  ? Poor venous access 10/28/2015  ? Hypertension 06/16/2015  ? Hyperlipidemia 06/16/2015  ? Mitral regurgitation 11/20/2014  ? CKD (chronic kidney disease) stage 4, GFR 15-29 ml/min (HCC) 11/04/2014  ? Chronic pain of right knee 10/10/2014  ? Iron deficiency anemia, unspecified 09/25/2014  ? Tachycardia 09/11/2014  ? Bilateral leg edema 09/11/2014  ? Primary gout 09/11/2014  ? Absolute anemia 07/11/2014  ? Anxiety 07/11/2014  ? A-fib ([place]) 07/08/2014  ? Injury of kidney 07/08/2014  ? 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 Atrial fibrillation ([place]) 01/19/2013  ? Palpitations 01/19/2013  ? Kidney disease, chronic, stage III (GFR 30-59 ml/min) (HCC) 11/06/2012  ? GAMMA GLUTAMYL TRANSFERASE,  SERUM, ELEVATED 10/13/2010  ? UNSPECIFIED VITAMIN D DEFICIENCY 10/09/2010  ? Gout 10/09/2010  ? Polycystic kidney 10/08/2008  ? HYPERLIPIDEMIA 07/19/2006  ? OBESITY, NOS 07/19/2006  ? HYPERTENSION, BENIGN SYSTEMIC 07/19/2006  ? KIDNEY STONE - NEPHROLITHIASIS 07/19/2006  ? OSTEOARTHRITIS, MULTI SITES 07/19/2006  ? ?Family History  ?Problem Relation Age of Onset  ? Polycystic kidney disease Father   ? Heart failure Father   ? Heart disease Mother   ?     Rheumatic fever  ? ?Allergies  ?Allergen Reactions  ? Codeine Itching  ? Morphine Itching  ? Poison Ivy Extract   ?  Rash, itching  ? Zovirax [Acyclovir Sodium] Itching  ? ?  ? ?Review of Systems  ?All other systems reviewed and are negative. ? ?  ?Objective:  ?  ? ?BP (!) 133/49   Pulse 72   Ht '5\' 2"'$  (1.575 m)   Wt 196 lb (88.9 kg)   LMP 07/11/2009 (Approximate)   SpO2 99%   BMI 35.85 kg/m?  ?BP Readings from Last 3 Encounters:  ?02/02/22 (!) 133/49  ?08/08/21 120/76  ?06/30/21 103/70  ? ?Wt Readings from Last 3 Encounters:  ?02/02/22 196 lb (88.9 kg)  ?08/08/21 187 lb (84.8 kg)  ?06/30/21 191 lb (86.6 kg)  ? ?  ? ?Physical Exam ?Vitals reviewed.  ?Constitutional:   ?   Appearance: Normal appearance. She is obese.  ?HENT:  ?   Head: Normocephalic.  ?Cardiovascular:  ?   Rate and Rhythm: Normal rate and regular rhythm.  ?   Pulses: Normal pulses.  ?  Pulmonary:  ?   Effort: Pulmonary effort is normal.  ?   Breath sounds: Normal breath sounds.  ?Musculoskeletal:  ?   Right lower leg: No edema.  ?   Left lower leg: No edema.  ?Neurological:  ?   General: No focal deficit present.  ?   Mental Status: She is alert and oriented to person, place, and time.  ?Psychiatric:     ?   Mood and Affect: Mood normal.  ? ? ? ?.. ? ?  02/02/2022  ? 11:00 AM 04/28/2021  ? 11:23 AM 11/17/2020  ?  1:32 PM 09/09/2017  ?  1:08 PM  ?Depression screen PHQ 2/9  ?Decreased Interest 0 0 0 0  ?Down, Depressed, Hopeless 0 0 0 0  ?PHQ - 2 Score 0 0 0 0  ?Altered sleeping   0   ?Tired, decreased  energy   0   ?Change in appetite   0   ?Feeling bad or failure about yourself    0   ?Trouble concentrating   0   ?Moving slowly or fidgety/restless   0   ?Suicidal thoughts   0   ?PHQ-9 Score   0   ?Difficult doing work/chores   Not difficult at all   ? ?.. ? ?  11/17/2020  ?  1:32 PM  ?GAD 7 : Generalized Anxiety Score  ?Nervous, Anxious, on Edge 0  ?Control/stop worrying 0  ?Worry too much - different things 0  ?Trouble relaxing 0  ?Restless 0  ?Easily annoyed or irritable 0  ?Afraid - awful might happen 0  ?Total GAD 7 Score 0  ?Anxiety Difficulty Not difficult at all  ? ? ? ? ?  ?Assessment & Plan:  ?..Mariah [name] was seen today for follow-up. ? ?Diagnoses and all orders for this visit: ? ?Anxiety ?-     venlafaxine XR (EFFEXOR-XR) 37.5 MG 24 hr capsule; TAKE 1 CAPSULE BY MOUTH EVERY OTHER DAY ? ?Muscle spasm ?-     cyclobenzaprine (FLEXERIL) 5 MG tablet; TAKE 1 TABLET BY MOUTH AT BEDTIME AS NEEDED FOR MUSCLE SPASM ? ?ADPKD (autosomal dominant polycystic kidney) ? ?Status post living-donor kidney transplantation ? ?Class 2 severe obesity due to excess calories with serious comorbidity and body mass index (BMI) of 35.0 to 35.9 in adult [facility]) ? ? ?Labs and follow up managed by transplant and nephrology ?Vitals look great today ?PHQ/GAD look great ?Effexor refilled ?Flexeril for as needed muscle spasms ? ?[name] Kitchen.Discussed low carb diet with 1500 calories and 80g of protein.  ?Exercising at least 150 minutes a week.  ?My Fitness Pal could be a Microbiologist.  ?Ask nephrology about wegovy for weight loss.  ? ? ?Iran Planas, PA-C ? ?

## 2022-02-19 DIAGNOSIS — E559 Vitamin D deficiency, unspecified: Secondary | ICD-10-CM | POA: Diagnosis not present

## 2022-02-19 DIAGNOSIS — R7301 Impaired fasting glucose: Secondary | ICD-10-CM | POA: Diagnosis not present

## 2022-02-19 DIAGNOSIS — Z940 Kidney transplant status: Secondary | ICD-10-CM | POA: Diagnosis not present

## 2022-02-19 DIAGNOSIS — Z4822 Encounter for aftercare following kidney transplant: Secondary | ICD-10-CM | POA: Diagnosis not present

## 2022-02-21 DIAGNOSIS — R519 Headache, unspecified: Secondary | ICD-10-CM | POA: Diagnosis not present

## 2022-02-21 DIAGNOSIS — Z940 Kidney transplant status: Secondary | ICD-10-CM | POA: Diagnosis not present

## 2022-03-18 DIAGNOSIS — Z4822 Encounter for aftercare following kidney transplant: Secondary | ICD-10-CM | POA: Diagnosis not present

## 2022-03-18 DIAGNOSIS — R7309 Other abnormal glucose: Secondary | ICD-10-CM | POA: Diagnosis not present

## 2022-03-18 DIAGNOSIS — N183 Chronic kidney disease, stage 3 unspecified: Secondary | ICD-10-CM | POA: Diagnosis not present

## 2022-03-25 DIAGNOSIS — Z905 Acquired absence of kidney: Secondary | ICD-10-CM | POA: Diagnosis not present

## 2022-03-25 DIAGNOSIS — Z5181 Encounter for therapeutic drug level monitoring: Secondary | ICD-10-CM | POA: Diagnosis not present

## 2022-03-25 DIAGNOSIS — Z79621 Long term (current) use of calcineurin inhibitor: Secondary | ICD-10-CM | POA: Diagnosis not present

## 2022-03-25 DIAGNOSIS — Z8719 Personal history of other diseases of the digestive system: Secondary | ICD-10-CM | POA: Diagnosis not present

## 2022-03-25 DIAGNOSIS — D649 Anemia, unspecified: Secondary | ICD-10-CM | POA: Diagnosis not present

## 2022-03-25 DIAGNOSIS — Z86010 Personal history of colonic polyps: Secondary | ICD-10-CM | POA: Diagnosis not present

## 2022-03-25 DIAGNOSIS — Z79899 Other long term (current) drug therapy: Secondary | ICD-10-CM | POA: Diagnosis not present

## 2022-03-25 DIAGNOSIS — Z940 Kidney transplant status: Secondary | ICD-10-CM | POA: Diagnosis not present

## 2022-03-25 DIAGNOSIS — E785 Hyperlipidemia, unspecified: Secondary | ICD-10-CM | POA: Diagnosis not present

## 2022-03-25 DIAGNOSIS — N049 Nephrotic syndrome with unspecified morphologic changes: Secondary | ICD-10-CM | POA: Diagnosis not present

## 2022-03-25 DIAGNOSIS — R6881 Early satiety: Secondary | ICD-10-CM | POA: Diagnosis not present

## 2022-03-25 DIAGNOSIS — Z95818 Presence of other cardiac implants and grafts: Secondary | ICD-10-CM | POA: Diagnosis not present

## 2022-03-25 DIAGNOSIS — I4891 Unspecified atrial fibrillation: Secondary | ICD-10-CM | POA: Diagnosis not present

## 2022-03-25 DIAGNOSIS — Z8679 Personal history of other diseases of the circulatory system: Secondary | ICD-10-CM | POA: Diagnosis not present

## 2022-03-25 DIAGNOSIS — M109 Gout, unspecified: Secondary | ICD-10-CM | POA: Diagnosis not present

## 2022-03-25 DIAGNOSIS — Z87442 Personal history of urinary calculi: Secondary | ICD-10-CM | POA: Diagnosis not present

## 2022-03-25 DIAGNOSIS — K7689 Other specified diseases of liver: Secondary | ICD-10-CM | POA: Diagnosis not present

## 2022-03-25 DIAGNOSIS — Z792 Long term (current) use of antibiotics: Secondary | ICD-10-CM | POA: Diagnosis not present

## 2022-03-25 DIAGNOSIS — Z4822 Encounter for aftercare following kidney transplant: Secondary | ICD-10-CM | POA: Diagnosis not present

## 2022-04-08 ENCOUNTER — Other Ambulatory Visit: Payer: Self-pay | Admitting: Obstetrics & Gynecology

## 2022-04-08 DIAGNOSIS — Z1231 Encounter for screening mammogram for malignant neoplasm of breast: Secondary | ICD-10-CM

## 2022-04-15 ENCOUNTER — Ambulatory Visit (INDEPENDENT_AMBULATORY_CARE_PROVIDER_SITE_OTHER): Payer: Medicare Other

## 2022-04-15 DIAGNOSIS — Z1231 Encounter for screening mammogram for malignant neoplasm of breast: Secondary | ICD-10-CM | POA: Diagnosis not present

## 2022-04-16 DIAGNOSIS — L821 Other seborrheic keratosis: Secondary | ICD-10-CM | POA: Diagnosis not present

## 2022-04-16 DIAGNOSIS — L820 Inflamed seborrheic keratosis: Secondary | ICD-10-CM | POA: Diagnosis not present

## 2022-04-16 DIAGNOSIS — D1801 Hemangioma of skin and subcutaneous tissue: Secondary | ICD-10-CM | POA: Diagnosis not present

## 2022-04-16 DIAGNOSIS — L814 Other melanin hyperpigmentation: Secondary | ICD-10-CM | POA: Diagnosis not present

## 2022-04-16 DIAGNOSIS — Z85828 Personal history of other malignant neoplasm of skin: Secondary | ICD-10-CM | POA: Diagnosis not present

## 2022-04-23 ENCOUNTER — Ambulatory Visit (INDEPENDENT_AMBULATORY_CARE_PROVIDER_SITE_OTHER): Payer: Medicare Other | Admitting: Obstetrics & Gynecology

## 2022-04-23 ENCOUNTER — Encounter (HOSPITAL_BASED_OUTPATIENT_CLINIC_OR_DEPARTMENT_OTHER): Payer: Self-pay | Admitting: Obstetrics & Gynecology

## 2022-04-23 ENCOUNTER — Other Ambulatory Visit (HOSPITAL_COMMUNITY)
Admission: RE | Admit: 2022-04-23 | Discharge: 2022-04-23 | Disposition: A | Discharge status: Home / Self Care | Payer: Medicare Other | Source: Ambulatory Visit | Attending: Obstetrics & Gynecology | Admitting: Obstetrics & Gynecology

## 2022-04-23 VITALS — BP 138/50 | HR 70 | Ht 62.0 in | Wt 199.0 lb

## 2022-04-23 DIAGNOSIS — Z1151 Encounter for screening for human papillomavirus (HPV): Secondary | ICD-10-CM | POA: Insufficient documentation

## 2022-04-23 DIAGNOSIS — Z9189 Other specified personal risk factors, not elsewhere classified: Secondary | ICD-10-CM | POA: Diagnosis not present

## 2022-04-23 DIAGNOSIS — D849 Immunodeficiency, unspecified: Secondary | ICD-10-CM | POA: Diagnosis not present

## 2022-04-23 DIAGNOSIS — Z124 Encounter for screening for malignant neoplasm of cervix: Secondary | ICD-10-CM

## 2022-04-23 NOTE — Progress Notes (Unsigned)
66 y.o. [place] Mar[place] White or Caucasian female here for breast and pelvic exam.  I am also following her for history of immunosuppression on .  Had laparoscopically bilateral nerphrectomy due to polycystic kidneys  Denies vaginal bleeding.  Patient's last menstrual period was 07/11/2009 (approximate).          Sexually active: {yes no:314532}  H/O STD:  {YES NO:22349}  Health Maintenance: PCP:  Iran Planas, PA.  Last wellness appt was ***.  Did blood work at that appt:   Vaccines are up to date:  yes Colonoscopy:  08/04/2015.  Had planned to do this last year.  Planning on doing this year with [facility] individual. MMG:  05/05/2022 Negative BMD:  04/29/2021 Osteopenia in the hip and spine. 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   H/o abnormal pap smear:      reports that she quit smoking about 25 years ago. Her smoking use included cigarettes. She has a 48.00 pack-year smoking history. She has never used smokeless tobacco. She reports that she does not currently use alcohol after a past usage of about 2.0 standard drinks of alcohol per week. She reports that she does not use drugs.  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[place])    Pneumonia    03/08/17- many years ago   Polycystic kidney disease 11/2016   transplant    PONV (postoperative nausea and vomiting)    Post-transplant diabetes mellitus (HCC)    Pre-diabetes    due to antirejection medication   Ruptured cyst of kidney    SDH (subdural hematoma) ([place])     Past Surgical History:  Procedure Laterality Date   BASCILIC VEIN  TRANSPOSITION Left 09/07/2016   Procedure: BASILIC VEIN TRANSPOSITION LEFT UPPER ARM;  Surgeon: Elam Dutch, MD;  Location: [facility];  Service: Vascular;  Laterality: Left;   COLONOSCOPY  03/2004   repeat 10 years   Evacuation of subdural hematoma  11/26/2000   KIDNEY STONE SURGERY     Cysto   KIDNEY TRANSPLANT     Nov 23, 2016   LEFT ATRIAL APPENDAGE OCCLUSION  06/03/2017   Dr. Remus Blake    LIGATION OF ARTERIOVENOUS  FISTULA Left 03/09/2017   Procedure: LIGATION OF LEFT BASILIC VEIN TRANSPOSITION;  Surgeon: Elam Dutch, MD;  Location: [facility];  Service: Vascular;  Laterality: Left;   NEPHRECTOMY  08/03/2021   ORIF ELBOW FRACTURE  10/22/2011   Procedure: OPEN REDUCTION INTERNAL FIXATION (ORIF) ELBOW/OLECRANON FRACTURE;  Surgeon: Schuyler Amor, MD;  Location: [facility];  Service: Orthopedics;  Laterality: Left;  open reduction  possible lateral reconstruction with palmaris graft from left or right side.   RADIAL HEAD IMPLANT  09/24/2011   Procedure: RADIAL HEAD IMPLANT;  Surgeon: Schuyler Amor, MD;  Location: [facility];  Service: Orthopedics;  Laterality: Left;  left radial head replacement   REPLACEMENT TOTAL KNEE  12/13/2020   Dr. Mayer Camel   TONSILLECTOMY      Current Outpatient Medications  Medication Sig Dispense Refill   acetaminophen (TYLENOL) 500 MG tablet Take 1,000 mg by mouth every 6 (six) hours as needed for fever (pain).     aspirin EC 81 MG tablet Take 81 mg by mouth daily.     atorvastatin (LIPITOR) 40 MG tablet Take 40 mg by mouth every Monday, Wednesday, and Friday.      cyclobenzaprine (FLEXERIL) 5 MG tablet TAKE 1 TABLET BY MOUTH AT BEDTIME AS NEEDED FOR MUSCLE SPASM 90 tablet 3   famotidine (PEPCID) 20 MG tablet daily.     loratadine (CLARITIN) 10 MG tablet Take 1 tablet by mouth daily.     MAGNESIUM PO Take by mouth.     metFORMIN (GLUCOPHAGE) 500 MG tablet Take 500 mg by mouth daily with breakfast.      Multiple Vitamin  (MULTIVITAMIN ADULT PO) Take by mouth.     mycophenolate (MYFORTIC) 180 MG EC tablet TAKE 2 TABLETS BY MOUTH 2 TIMES DAILY.     sulfamethoxazole-trimethoprim (BACTRIM) 400-80 MG tablet Take 1 tablet by mouth. M,W,F     tacrolimus (PROGRAF) 1 MG capsule Take 5 mg in the AM and 4 mg in the PM by mouth daily     venlafaxine XR (EFFEXOR-XR) 37.5 MG 24 hr capsule TAKE 1 CAPSULE BY MOUTH EVERY OTHER DAY 45 capsule 3   sotalol (BETAPACE) 80 MG tablet Take 80 mg by mouth 2 (two) times daily.      No current facility-administered medications for this visit.    Family History  Problem Relation Age of Onset   Polycystic kidney disease Father    Heart failure Father    Heart disease Mother        Rheumatic fever   ROS: Review of Systems  Genitourinary: Negative.     Exam:   BP (!) 138/50 (BP Location: Left Arm, Patient Position: Sitting, Cuff Size: Large)   Pulse 70   Ht '5\' 2"'$  (1.575 m) Comment: reported  Wt 199 lb (90.3 kg)   LMP 07/11/2009 (Approximate)   BMI 36.40 kg/m   Height: '5\' 2"'$  (157.5 cm) (reported)  General appearance: alert, cooperative and appears stated age Breasts: normal appearance, no masses or tenderness Abdomen: soft, non-tender; bowel sounds normal; no masses,  no organomegaly Lymph nodes: Cervical, supraclavicular, and axillary nodes normal.  No abnormal inguinal nodes palpated Neurologic: Grossly normal  Pelvic: External genitalia:  no lesions              Urethra:  normal appearing urethra with no masses, tenderness or lesions              Bartholins and Skenes: normal                 Vagina: normal appearing vagina with atrophic changes and no discharge, no lesions              Cervix: no lesions              Pap taken: Yes.   Bimanual Exam:  Uterus:  normal size, contour, position, consistency, mobility, non-tender              Adnexa: normal adnexa and no mass, fullness, tenderness               Rectovaginal: Confirms               Anus:  normal sphincter  tone, no lesions  Chaperone, Octaviano Batty, CMA, was present for exam.  Assessment/Plan: 1. Gynecologic exam normal -  pap smear obtained today - MMG done earlier today - colonoscopy was due last year.  Pt aware.  Planning on having later this summer/early fall - bmd will be ordered - vaccines reviewed - lab work done with nephrologist, Dr Duwayne Heck, and PCP, Iran Planas   2. Hypoestrogenism - DG BONE DENSITY (DXA); Future   3. Postmenopausal - no HRT   4. ADPKD (autosomal dominant polycystic kidney)   5. Immunosuppression ([place])   6. Renal transplant recipient

## 2022-04-29 LAB — CYTOLOGY - PAP
Comment: NEGATIVE
Diagnosis: NEGATIVE
High risk HPV: NEGATIVE

## 2022-05-26 DIAGNOSIS — H524 Presbyopia: Secondary | ICD-10-CM | POA: Diagnosis not present

## 2022-05-26 DIAGNOSIS — H43393 Other vitreous opacities, bilateral: Secondary | ICD-10-CM | POA: Diagnosis not present

## 2022-05-26 DIAGNOSIS — H52203 Unspecified astigmatism, bilateral: Secondary | ICD-10-CM | POA: Diagnosis not present

## 2022-05-26 DIAGNOSIS — H2513 Age-related nuclear cataract, bilateral: Secondary | ICD-10-CM | POA: Diagnosis not present

## 2022-05-26 DIAGNOSIS — H43813 Vitreous degeneration, bilateral: Secondary | ICD-10-CM | POA: Diagnosis not present

## 2022-05-26 DIAGNOSIS — H5213 Myopia, bilateral: Secondary | ICD-10-CM | POA: Diagnosis not present

## 2022-05-31 DIAGNOSIS — Z86010 Personal history of colonic polyps: Secondary | ICD-10-CM | POA: Diagnosis not present

## 2022-06-02 ENCOUNTER — Encounter: Payer: Self-pay | Admitting: General Practice

## 2022-06-02 NOTE — Telephone Encounter (Signed)
Mariah [name] called to say she declines to schedule a medicare wellness visit. She stated that she just saw [name] last week.

## 2022-06-09 DIAGNOSIS — C4402 Squamous cell carcinoma of skin of lip: Secondary | ICD-10-CM | POA: Diagnosis not present

## 2022-06-13 DIAGNOSIS — H527 Unspecified disorder of refraction: Secondary | ICD-10-CM | POA: Diagnosis not present

## 2022-06-21 ENCOUNTER — Encounter: Payer: Self-pay | Admitting: Physician Assistant

## 2022-06-24 ENCOUNTER — Encounter: Payer: Self-pay | Admitting: Physician Assistant

## 2022-07-05 ENCOUNTER — Encounter (HOSPITAL_BASED_OUTPATIENT_CLINIC_OR_DEPARTMENT_OTHER): Payer: Self-pay | Admitting: Obstetrics & Gynecology

## 2022-07-05 DIAGNOSIS — E559 Vitamin D deficiency, unspecified: Secondary | ICD-10-CM | POA: Diagnosis not present

## 2022-07-05 DIAGNOSIS — Z4822 Encounter for aftercare following kidney transplant: Secondary | ICD-10-CM | POA: Diagnosis not present

## 2022-07-05 DIAGNOSIS — Z79899 Other long term (current) drug therapy: Secondary | ICD-10-CM | POA: Diagnosis not present

## 2022-07-05 DIAGNOSIS — Z79621 Long term (current) use of calcineurin inhibitor: Secondary | ICD-10-CM | POA: Diagnosis not present

## 2022-07-13 DIAGNOSIS — D649 Anemia, unspecified: Secondary | ICD-10-CM | POA: Diagnosis not present

## 2022-07-13 DIAGNOSIS — Z940 Kidney transplant status: Secondary | ICD-10-CM | POA: Diagnosis not present

## 2022-07-13 DIAGNOSIS — Z4822 Encounter for aftercare following kidney transplant: Secondary | ICD-10-CM | POA: Diagnosis not present

## 2022-07-13 DIAGNOSIS — D84821 Immunodeficiency due to drugs: Secondary | ICD-10-CM | POA: Diagnosis not present

## 2022-07-13 DIAGNOSIS — R809 Proteinuria, unspecified: Secondary | ICD-10-CM | POA: Diagnosis not present

## 2022-07-13 DIAGNOSIS — C4402 Squamous cell carcinoma of skin of lip: Secondary | ICD-10-CM | POA: Diagnosis not present

## 2022-07-13 DIAGNOSIS — Z79621 Long term (current) use of calcineurin inhibitor: Secondary | ICD-10-CM | POA: Diagnosis not present

## 2022-07-13 DIAGNOSIS — Z79624 Long term (current) use of inhibitors of nucleotide synthesis: Secondary | ICD-10-CM | POA: Diagnosis not present

## 2022-07-27 DIAGNOSIS — I480 Paroxysmal atrial fibrillation: Secondary | ICD-10-CM | POA: Diagnosis not present

## 2022-07-27 DIAGNOSIS — Z940 Kidney transplant status: Secondary | ICD-10-CM | POA: Diagnosis not present

## 2022-07-27 DIAGNOSIS — Z905 Acquired absence of kidney: Secondary | ICD-10-CM | POA: Diagnosis not present

## 2022-07-27 DIAGNOSIS — Z79899 Other long term (current) drug therapy: Secondary | ICD-10-CM | POA: Diagnosis not present

## 2022-07-27 DIAGNOSIS — Z95818 Presence of other cardiac implants and grafts: Secondary | ICD-10-CM | POA: Diagnosis not present

## 2022-07-27 DIAGNOSIS — Z7982 Long term (current) use of aspirin: Secondary | ICD-10-CM | POA: Diagnosis not present

## 2022-07-27 DIAGNOSIS — Z87448 Personal history of other diseases of urinary system: Secondary | ICD-10-CM | POA: Diagnosis not present

## 2022-07-27 DIAGNOSIS — R9431 Abnormal electrocardiogram [ECG] [EKG]: Secondary | ICD-10-CM | POA: Diagnosis not present

## 2022-07-31 DIAGNOSIS — I480 Paroxysmal atrial fibrillation: Secondary | ICD-10-CM | POA: Diagnosis not present

## 2022-07-31 DIAGNOSIS — I499 Cardiac arrhythmia, unspecified: Secondary | ICD-10-CM | POA: Diagnosis not present

## 2022-09-26 ENCOUNTER — Other Ambulatory Visit: Payer: Self-pay

## 2022-09-26 ENCOUNTER — Ambulatory Visit: Admit: 2022-09-26 | Payer: Medicare Other

## 2022-09-26 ENCOUNTER — Ambulatory Visit
Admission: EM | Admit: 2022-09-26 | Discharge: 2022-09-26 | Disposition: A | Discharge status: Home / Self Care | Payer: Medicare Other | Attending: Family Medicine | Admitting: Family Medicine

## 2022-09-26 ENCOUNTER — Encounter: Payer: Self-pay | Admitting: Emergency Medicine

## 2022-09-26 DIAGNOSIS — J0100 Acute maxillary sinusitis, unspecified: Secondary | ICD-10-CM | POA: Diagnosis not present

## 2022-09-26 DIAGNOSIS — R059 Cough, unspecified: Secondary | ICD-10-CM

## 2022-09-26 DIAGNOSIS — J309 Allergic rhinitis, unspecified: Secondary | ICD-10-CM

## 2022-09-26 MED ORDER — AMOXICILLIN 875 MG PO TABS: 875.0000 mg | ORAL_TABLET | Freq: Two times a day (BID) | ORAL | 0 refills | Status: DC

## 2022-09-26 MED ORDER — PROMETHAZINE-DM 6.25-15 MG/5ML PO SYRP: 5.0000 mL | ORAL_SOLUTION | Freq: Two times a day (BID) | ORAL | 0 refills | Status: DC | PRN

## 2022-09-26 MED ORDER — FEXOFENADINE HCL 180 MG PO TABS: 180.0000 mg | ORAL_TABLET | Freq: Every day | ORAL | 0 refills | Status: DC

## 2022-09-26 MED ORDER — BENZONATATE 200 MG PO CAPS: 200.0000 mg | ORAL_CAPSULE | Freq: Three times a day (TID) | ORAL | 0 refills | Status: AC | PRN

## 2022-09-26 NOTE — Discharge Instructions (Addendum)
Instructed patient to take medications as directed with food to completion.  Instructed patient to take Allegra with first dose of Amoxicillin for the next 5 of 7 days.  Advised may use Allegra as needed afterwards for concurrent postnasal drainage/drip.  Advised may use Tessalon daily or as needed for cough.  Advised may use Promethazine DM at night for cough due to sedate of effects.  Instructed patient not to take cough medications together.  Encouraged patient to increase daily water intake to 64 ounces per day while taking these medications.  Advised if symptoms worsen and/or unresolved please follow-up with PCP or here for further evaluation.

## 2022-09-26 NOTE — ED Provider Notes (Signed)
[name]    CSN: [id number] Arrival date & time: 09/26/22  1038      History   Chief Complaint Chief Complaint  Patient presents with   Nasal Congestion   Cough    HPI Mariah [name] is a 66 y.o. female.   HPI 66 year old female presents with nasal congestion, body aches, headaches and postnasal drainage for 2 to 3 days.  PMH significant for morbid obesity, immunosuppressed, atrial fibrillation, CKD stage III, and HTN.  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[place])    Pre-diabetes    due to antirejection medication   Ruptured cyst of kidney    SDH (subdural hematoma) ([place])     Patient Active Problem List   Diagnosis Date Noted   ATN (acute tubular necrosis) ([place]) 07/06/2018   Liver cyst 06/15/2018   Presence of [place] left atrial appendage closure device 12/07/2017   Nephrolithiasis 11/14/2017   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   Hx of motion sickness 10/17/2017   Muscle spasm 10/17/2017   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[place]) 12/16/2016   Status post living-donor kidney transplantation 11/29/2016   Immunosuppression ([place]) 11/29/2016   Adenomatous polyp of colon 05/12/2016   Poor venous access 10/28/2015   Hypertension 06/16/2015   Hyperlipidemia 06/16/2015   Mitral regurgitation 11/20/2014   CKD (chronic kidney disease) stage 4, GFR 15-29 ml/min (HCC) 11/04/2014   Chronic  pain of right knee 10/10/2014   Iron deficiency anemia, unspecified 09/25/2014   Tachycardia 09/11/2014   Bilateral leg edema 09/11/2014   Primary gout 09/11/2014   Anxiety 07/11/2014   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   Atrial fibrillation ([place]) 01/19/2013   Kidney disease, chronic, stage III (GFR 30-59 ml/min) (HCC) 11/06/2012   GAMMA GLUTAMYL TRANSFERASE, SERUM, ELEVATED 10/13/2010   UNSPECIFIED VITAMIN D DEFICIENCY 10/09/2010   Gout 10/09/2010   Polycystic kidney 10/08/2008   HYPERLIPIDEMIA 07/19/2006   Class 2 severe obesity due to excess calories with serious comorbidity and body mass index (BMI) of 35.0 to 35.9 in adult ([place]) 07/19/2006   HYPERTENSION, BENIGN SYSTEMIC 07/19/2006   OSTEOARTHRITIS, [place] SITES 07/19/2006    Past Surgical History:  Procedure Laterality Date   BASCILIC VEIN TRANSPOSITION Left 09/07/2016   Procedure: BASILIC VEIN TRANSPOSITION LEFT UPPER ARM;  Surgeon: Elam Dutch, MD;  Location: [facility];  Service: Vascular;  Laterality: Left;   COLONOSCOPY  03/2004   repeat 10 years   Evacuation of subdural hematoma  11/26/2000   KIDNEY STONE SURGERY     Cysto   KIDNEY TRANSPLANT     Nov 23, 2016   LEFT ATRIAL APPENDAGE OCCLUSION  06/03/2017   Dr. Remus Blake    LIGATION OF ARTERIOVENOUS  FISTULA Left 03/09/2017   Procedure: LIGATION OF LEFT BASILIC VEIN TRANSPOSITION;  Surgeon: Ruta Hinds  E, MD;  Location: [facility];  Service: Vascular;  Laterality: Left;   NEPHRECTOMY  08/03/2021   ORIF ELBOW FRACTURE  10/22/2011   Procedure: OPEN REDUCTION INTERNAL FIXATION (ORIF) ELBOW/OLECRANON FRACTURE;  Surgeon: Schuyler Amor, MD;  Location: [facility];  Service: Orthopedics;  Laterality: Left;  open reduction  possible lateral reconstruction with palmaris graft from left or right side.   RADIAL HEAD IMPLANT  09/24/2011   Procedure: RADIAL HEAD IMPLANT;  Surgeon: Schuyler Amor, MD;  Location: [facility];   Service: Orthopedics;  Laterality: Left;  left radial head replacement   REPLACEMENT TOTAL KNEE  12/13/2020   Dr. Mayer Camel   TONSILLECTOMY      OB History     Gravida  0   Para  0   Term  0   Preterm  0   AB  0   Living  0      SAB  0   IAB  0   Ectopic  0   Multiple  0   Live Births  0            Home Medications    Prior to Admission medications   Medication Sig Start Date End Date Taking? Authorizing Provider  acetaminophen (TYLENOL) 500 MG tablet Take 1,000 mg by mouth every 6 (six) hours as needed for fever (pain).   Yes [provider]  amoxicillin (AMOXIL) 875 MG tablet Take 1 tablet (875 mg total) by mouth 2 (two) times daily for 7 days. 09/26/22 10/03/22 Yes Eliezer Lofts, FNP  aspirin EC 81 MG tablet Take 81 mg by mouth daily.   Yes [provider]  atorvastatin (LIPITOR) 40 MG tablet Take 40 mg by mouth every Monday, Wednesday, and Friday.    Yes [provider]  benzonatate (TESSALON) 200 MG capsule Take 1 capsule (200 mg total) by mouth 3 (three) times daily as needed for up to 7 days. 09/26/22 10/03/22 Yes Eliezer Lofts, FNP  cyclobenzaprine (FLEXERIL) 5 MG tablet TAKE 1 TABLET BY MOUTH AT BEDTIME AS NEEDED FOR MUSCLE SPASM 02/02/22  Yes Breeback, [name] L, PA-C  famotidine (PEPCID) 20 MG tablet daily. 10/16/18  Yes [provider]  fexofenadine (ALLEGRA ALLERGY) 180 MG tablet Take 1 tablet (180 mg total) by mouth daily for 15 days. 09/26/22 10/11/22 Yes Eliezer Lofts, FNP  MAGNESIUM PO Take by mouth.   Yes [provider]  metFORMIN (GLUCOPHAGE) 500 MG tablet Take 500 mg by mouth daily with breakfast.    Yes [provider]  Multiple Vitamin (MULTIVITAMIN ADULT PO) Take by mouth.   Yes [provider]  mycophenolate (MYFORTIC) 180 MG EC tablet TAKE 2 TABLETS BY MOUTH 2 TIMES DAILY. 04/19/17  Yes [provider]  promethazine-dextromethorphan (PROMETHAZINE-DM) 6.25-15 MG/5ML syrup Take  5 mLs by mouth 2 (two) times daily as needed for cough. 09/26/22  Yes Eliezer Lofts, FNP  sulfamethoxazole-trimethoprim (BACTRIM) 400-80 MG tablet Take 1 tablet by mouth. M,W,F   Yes [provider]  tacrolimus (PROGRAF) 1 MG capsule Take 5 mg in the AM and 4 mg in the PM by mouth daily 09/20/17  Yes [provider]  venlafaxine XR (EFFEXOR-XR) 37.5 MG 24 hr capsule TAKE 1 CAPSULE BY MOUTH EVERY OTHER DAY 02/02/22  Yes Breeback, [name] L, PA-C  sotalol (BETAPACE) 80 MG tablet Take 80 mg by mouth 2 (two) times daily.  06/05/17 04/28/21  [provider]    Family History Family History  Problem  Relation Age of Onset   Polycystic kidney disease Father    Heart failure Father    Heart disease Mother        Rheumatic fever    Social History Social History   Tobacco Use   Smoking status: Former    Packs/day: 2.00    Years: 24.00    Total pack years: 48.00    Types: Cigarettes    Quit date: 12/12/1996    Years since quitting: 25.8   Smokeless tobacco: Never  Vaping Use   Vaping Use: Never used  Substance Use Topics   Alcohol use: Not Currently    Alcohol/week: 2.0 standard drinks of alcohol    Types: 2 Glasses of wine per week   Drug use: No     Allergies   Codeine, Morphine, Poison ivy extract, and Zovirax [acyclovir sodium]   Review of Systems Review of Systems   Physical Exam Triage Vital Signs ED Triage Vitals  Enc Vitals Group     BP 09/26/22 1242 128/83     Pulse Rate 09/26/22 1242 76     Resp 09/26/22 1242 20     Temp 09/26/22 1242 98.4 F (36.9 C)     Temp Source 09/26/22 1242 Oral     SpO2 09/26/22 1242 99 %     Weight --      Height --      Head Circumference --      Peak Flow --      Pain Score 09/26/22 1240 4     Pain Loc --      Pain Edu? --      Excl. in [place]? --    No data found.  Updated Vital Signs BP 128/83 (BP Location: Left Arm)   Pulse 76   Temp 98.4 F (36.9 C) (Oral)   Resp 20   LMP 07/11/2009 (Approximate)    SpO2 99%      Physical Exam Vitals and nursing note reviewed.  Constitutional:      Appearance: Normal appearance. She is obese. She is ill-appearing.  HENT:     Head: Normocephalic and atraumatic.     Right Ear: Tympanic membrane and external ear normal.     Left Ear: Tympanic membrane and external ear normal.     Ears:     Comments: Moderate eustachian tube dysfunction noted bilaterally    Mouth/Throat:     Mouth: Mucous membranes are moist.     Pharynx: Oropharynx is clear.     Comments: Significant amount of clear drainage of posterior oropharynx noted Eyes:     Extraocular Movements: Extraocular movements intact.     Conjunctiva/sclera: Conjunctivae normal.     Pupils: Pupils are equal, round, and reactive to light.  Cardiovascular:     Rate and Rhythm: Normal rate and regular rhythm.     Pulses: Normal pulses.     Heart sounds: Normal heart sounds.  Pulmonary:     Effort: Pulmonary effort is normal.     Breath sounds: Normal breath sounds. No wheezing, rhonchi or rales.     Comments: Frequent nonproductive cough noted Musculoskeletal:        General: Normal range of motion.     Cervical back: Normal range of motion and neck supple.  Skin:    General: Skin is warm and dry.  Neurological:     General: No focal deficit present.     Mental Status: She is alert and oriented to person, place, and time. Mental status  is at baseline.      UC Treatments / Results  Labs (all labs ordered are listed, but only abnormal results are displayed) Labs Reviewed - No data to display  EKG   Radiology No results found.  Procedures Procedures (including critical care time)  Medications Ordered in UC Medications - No data to display  Initial Impression / Assessment and Plan / UC Course  I have reviewed the triage vital signs and the nursing notes.  Pertinent labs & imaging results that were available during my care of the patient were reviewed by me and considered in my  medical decision making (see chart for details).     MDM: 1.  Subacute maxillary sinusitis-Rx'd Amoxicillin; 2.  Cough-Rx'd Tessalon, Promethazine DM; 3.  Allergic rhinitis-Rx'd Allegra discontinued Claritin. Instructed patient to take medications as directed with food to completion.  Instructed patient to take Allegra with first dose of Amoxicillin for the next 5 of 7 days.  Advised may use Allegra as needed afterwards for concurrent postnasal drainage/drip.  Advised may use Tessalon daily or as needed for cough.  Advised may use Promethazine DM at night for cough due to sedate of effects.  Instructed patient not to take cough medications together.  Encouraged patient to increase daily water intake to 64 ounces per day while taking these medications.  Advised if symptoms worsen and/or unresolved please follow-up with PCP or here for further evaluation. Final Clinical Impressions(s) / UC Diagnoses   Final diagnoses:  Subacute maxillary sinusitis  Allergic rhinitis, unspecified seasonality, unspecified trigger  Cough, unspecified type     Discharge Instructions      Instructed patient to take medications as directed with food to completion.  Instructed patient to take Allegra with first dose of Amoxicillin for the next 5 of 7 days.  Advised may use Allegra as needed afterwards for concurrent postnasal drainage/drip.  Advised may use Tessalon daily or as needed for cough.  Advised may use Promethazine DM at night for cough due to sedate of effects.  Instructed patient not to take cough medications together.  Encouraged patient to increase daily water intake to 64 ounces per day while taking these medications.  Advised if symptoms worsen and/or unresolved please follow-up with PCP or here for further evaluation.     ED Prescriptions     Medication Sig Dispense Auth. Provider   amoxicillin (AMOXIL) 875 MG tablet Take 1 tablet (875 mg total) by mouth 2 (two) times daily for 7 days. 14 tablet  Eliezer Lofts, FNP   fexofenadine [facility] ALLERGY) 180 MG tablet Take 1 tablet (180 mg total) by mouth daily for 15 days. 15 tablet Eliezer Lofts, FNP   benzonatate (TESSALON) 200 MG capsule Take 1 capsule (200 mg total) by mouth 3 (three) times daily as needed for up to 7 days. 40 capsule Eliezer Lofts, FNP   promethazine-dextromethorphan (PROMETHAZINE-DM) 6.25-15 MG/5ML syrup Take 5 mLs by mouth 2 (two) times daily as needed for cough. 118 mL Eliezer Lofts, FNP      PDMP not reviewed this encounter.   Eliezer Lofts, [place] 09/26/22 1344

## 2022-09-26 NOTE — ED Triage Notes (Addendum)
Patient presents to Urgent Care with complaints of nasal congestion-yellow colored, cough, body aches, headaches, postnasal drainage, nausea, chills since 2-3 days ago. Patient reports she had kidney transplant.Has taken Tylenol, Delsym, Mucinex DM, Benadryl.

## 2022-09-28 ENCOUNTER — Encounter: Payer: Self-pay | Admitting: Physician Assistant

## 2022-09-28 MED ORDER — METHYLPREDNISOLONE 4 MG PO TBPK: ORAL_TABLET | ORAL | 0 refills | Status: DC

## 2022-09-30 ENCOUNTER — Encounter: Payer: Self-pay | Admitting: Physician Assistant

## 2022-10-01 MED ORDER — FLUCONAZOLE 150 MG PO TABS: 150.0000 mg | ORAL_TABLET | Freq: Every day | ORAL | 0 refills | Status: DC

## 2022-10-22 ENCOUNTER — Ambulatory Visit (INDEPENDENT_AMBULATORY_CARE_PROVIDER_SITE_OTHER): Payer: Medicare Other | Admitting: Physician Assistant

## 2022-10-22 VITALS — BP 123/60 | HR 72 | Ht 62.0 in | Wt 205.0 lb

## 2022-10-22 DIAGNOSIS — J0100 Acute maxillary sinusitis, unspecified: Secondary | ICD-10-CM

## 2022-10-22 MED ORDER — FLUCONAZOLE 150 MG PO TABS: 150.0000 mg | ORAL_TABLET | Freq: Once | ORAL | 0 refills | Status: AC

## 2022-10-22 MED ORDER — IPRATROPIUM BROMIDE 0.06 % NA SOLN: 2.0000 | Freq: Four times a day (QID) | NASAL | 0 refills | Status: DC

## 2022-10-22 MED ORDER — DOXYCYCLINE HYCLATE 100 MG PO TABS: 100.0000 mg | ORAL_TABLET | Freq: Two times a day (BID) | ORAL | 0 refills | Status: DC

## 2022-10-22 MED ORDER — ONDANSETRON 8 MG PO TBDP: 8.0000 mg | ORAL_TABLET | Freq: Three times a day (TID) | ORAL | 1 refills | Status: AC | PRN

## 2022-10-22 NOTE — Patient Instructions (Signed)
Start doxycycline Diflucan if needed Atrovent after finish doxycycline if needed

## 2022-10-22 NOTE — Progress Notes (Unsigned)
   Established Patient Office Visit  Subjective   Patient ID: Mariah [name], female    DOB: [birth year]  Age: 67 y.o. MRN: [id number]  No chief complaint on file.   HPI Pt is a 66 yo female who presents to the clinic with sinus pressure more right than left, nasal congestion, and cough for the last month. Went to [facility] 12/17 and started antibiotic. She did get better but cough did not get better and did a round of prednisone. She got much better but continues to have some linguring symptoms. She is on claritin daily for allergies. No fever, chills, body aches, SOB. Cough is dry. Continues to have some post nasal drip.   PMH significant for obesity, immunosuppressed, atrial fibrillation, CKD stage III, and HTN.   ROS See HPI.    Objective:     LMP 07/11/2009 (Approximate)  BP Readings from Last 3 Encounters:  09/26/22 128/83  04/23/22 (!) 138/50  02/02/22 (!) 133/49   Wt Readings from Last 3 Encounters:  04/23/22 199 lb (90.3 kg)  02/02/22 196 lb (88.9 kg)  08/08/21 187 lb (84.8 kg)      Physical Exam Vitals reviewed.  Constitutional:      Appearance: Normal appearance. She is obese.  HENT:     Head: Normocephalic.     Comments: Right sided maxillary tenderness to palpation    Right Ear: Tympanic membrane, ear canal and external ear normal. There is no impacted cerumen.     Left Ear: Tympanic membrane, ear canal and external ear normal. There is no impacted cerumen.     Nose: Rhinorrhea present.     Mouth/Throat:     Mouth: Mucous membranes are moist.     Pharynx: Posterior oropharyngeal erythema present. No oropharyngeal exudate.  Eyes:     Conjunctiva/sclera: Conjunctivae normal.  Cardiovascular:     Rate and Rhythm: Normal rate and regular rhythm.  Pulmonary:     Effort: Pulmonary effort is normal.     Breath sounds: Normal breath sounds.  Musculoskeletal:     Cervical back: Neck supple.  Lymphadenopathy:     Cervical: No cervical adenopathy.  Neurological:      General: No focal deficit present.     Mental Status: She is alert and oriented to person, place, and time.  Psychiatric:        Mood and Affect: Mood normal.        Assessment & Plan:  [name] KitchenMarland KitchenJay was seen today for follow-up.  Diagnoses and all orders for this visit:  Subacute maxillary sinusitis -     doxycycline (VIBRA-TABS) 100 MG tablet; Take 1 tablet (100 mg total) by mouth 2 (two) times daily. -     fluconazole (DIFLUCAN) 150 MG tablet; Take 1 tablet (150 mg total) by mouth once for 1 dose. -     ipratropium (ATROVENT) 0.06 % nasal spray; Place 2 sprays into both nostrils 4 (four) times daily.  Other orders -     ondansetron (ZOFRAN-ODT) 8 MG disintegrating tablet; Take 1 tablet (8 mg total) by mouth every 8 (eight) hours as needed for nausea.   Sent another round of doxycycline with diflucan if needed and encouraged to take atrovent Follow up if not improving or worsening and will get a sinus CT.    Iran Planas, PA-C

## 2022-10-25 ENCOUNTER — Encounter: Payer: Self-pay | Admitting: Physician Assistant

## 2022-11-18 DIAGNOSIS — E559 Vitamin D deficiency, unspecified: Secondary | ICD-10-CM | POA: Diagnosis not present

## 2022-11-18 DIAGNOSIS — R7989 Other specified abnormal findings of blood chemistry: Secondary | ICD-10-CM | POA: Diagnosis not present

## 2022-11-18 DIAGNOSIS — Z4822 Encounter for aftercare following kidney transplant: Secondary | ICD-10-CM | POA: Diagnosis not present

## 2022-11-23 DIAGNOSIS — K7689 Other specified diseases of liver: Secondary | ICD-10-CM | POA: Diagnosis not present

## 2022-11-23 DIAGNOSIS — R6881 Early satiety: Secondary | ICD-10-CM | POA: Diagnosis not present

## 2022-11-23 DIAGNOSIS — Z8739 Personal history of other diseases of the musculoskeletal system and connective tissue: Secondary | ICD-10-CM | POA: Diagnosis not present

## 2022-11-23 DIAGNOSIS — Z940 Kidney transplant status: Secondary | ICD-10-CM | POA: Diagnosis not present

## 2022-11-23 DIAGNOSIS — Z905 Acquired absence of kidney: Secondary | ICD-10-CM | POA: Diagnosis not present

## 2022-11-23 DIAGNOSIS — Z4822 Encounter for aftercare following kidney transplant: Secondary | ICD-10-CM | POA: Diagnosis not present

## 2022-11-23 DIAGNOSIS — Z87442 Personal history of urinary calculi: Secondary | ICD-10-CM | POA: Diagnosis not present

## 2022-11-23 DIAGNOSIS — D849 Immunodeficiency, unspecified: Secondary | ICD-10-CM | POA: Diagnosis not present

## 2022-11-23 DIAGNOSIS — I4819 Other persistent atrial fibrillation: Secondary | ICD-10-CM | POA: Diagnosis not present

## 2022-11-23 DIAGNOSIS — E785 Hyperlipidemia, unspecified: Secondary | ICD-10-CM | POA: Diagnosis not present

## 2022-11-29 DIAGNOSIS — I10 Essential (primary) hypertension: Secondary | ICD-10-CM | POA: Diagnosis not present

## 2022-11-29 DIAGNOSIS — Z95818 Presence of other cardiac implants and grafts: Secondary | ICD-10-CM | POA: Diagnosis not present

## 2022-11-29 DIAGNOSIS — Z79621 Long term (current) use of calcineurin inhibitor: Secondary | ICD-10-CM | POA: Diagnosis not present

## 2022-11-29 DIAGNOSIS — I482 Chronic atrial fibrillation, unspecified: Secondary | ICD-10-CM | POA: Diagnosis not present

## 2022-11-29 DIAGNOSIS — D849 Immunodeficiency, unspecified: Secondary | ICD-10-CM | POA: Diagnosis not present

## 2022-11-29 DIAGNOSIS — D649 Anemia, unspecified: Secondary | ICD-10-CM | POA: Diagnosis not present

## 2022-11-29 DIAGNOSIS — E785 Hyperlipidemia, unspecified: Secondary | ICD-10-CM | POA: Diagnosis not present

## 2022-11-29 DIAGNOSIS — Z7982 Long term (current) use of aspirin: Secondary | ICD-10-CM | POA: Diagnosis not present

## 2022-11-29 DIAGNOSIS — Z79899 Other long term (current) drug therapy: Secondary | ICD-10-CM | POA: Diagnosis not present

## 2022-11-29 DIAGNOSIS — Z4822 Encounter for aftercare following kidney transplant: Secondary | ICD-10-CM | POA: Diagnosis not present

## 2022-11-29 DIAGNOSIS — Z79624 Long term (current) use of inhibitors of nucleotide synthesis: Secondary | ICD-10-CM | POA: Diagnosis not present

## 2022-11-29 DIAGNOSIS — Z87891 Personal history of nicotine dependence: Secondary | ICD-10-CM | POA: Diagnosis not present

## 2022-11-29 DIAGNOSIS — Z940 Kidney transplant status: Secondary | ICD-10-CM | POA: Diagnosis not present

## 2022-12-09 DIAGNOSIS — K635 Polyp of colon: Secondary | ICD-10-CM | POA: Diagnosis not present

## 2022-12-09 DIAGNOSIS — D125 Benign neoplasm of sigmoid colon: Secondary | ICD-10-CM | POA: Diagnosis not present

## 2022-12-09 DIAGNOSIS — K5730 Diverticulosis of large intestine without perforation or abscess without bleeding: Secondary | ICD-10-CM | POA: Diagnosis not present

## 2022-12-09 DIAGNOSIS — Z1211 Encounter for screening for malignant neoplasm of colon: Secondary | ICD-10-CM | POA: Diagnosis not present

## 2022-12-09 DIAGNOSIS — Z940 Kidney transplant status: Secondary | ICD-10-CM | POA: Diagnosis not present

## 2022-12-09 DIAGNOSIS — Z86010 Personal history of colonic polyps: Secondary | ICD-10-CM | POA: Diagnosis not present

## 2022-12-09 DIAGNOSIS — R599 Enlarged lymph nodes, unspecified: Secondary | ICD-10-CM | POA: Diagnosis not present

## 2022-12-17 ENCOUNTER — Telehealth: Payer: Self-pay | Admitting: Physician Assistant

## 2022-12-17 NOTE — Telephone Encounter (Signed)
Contacted Mariah [name] to schedule their annual wellness visit. Appointment made for 01/06/23 at [facility] Direct Dial: [phone]

## 2023-01-06 ENCOUNTER — Telehealth: Payer: Self-pay | Admitting: General Practice

## 2023-01-06 ENCOUNTER — Ambulatory Visit (INDEPENDENT_AMBULATORY_CARE_PROVIDER_SITE_OTHER): Payer: Medicare Other | Admitting: Family Medicine

## 2023-01-06 DIAGNOSIS — Z780 Asymptomatic menopausal state: Secondary | ICD-10-CM | POA: Diagnosis not present

## 2023-01-06 DIAGNOSIS — Z0000 Encounter for general adult medical examination without abnormal findings: Secondary | ICD-10-CM | POA: Diagnosis not present

## 2023-01-06 NOTE — Progress Notes (Signed)
MEDICARE ANNUAL WELLNESS VISIT  01/06/2023  Telephone Visit Disclaimer This Medicare AWV was conducted by telephone due to national recommendations for restrictions regarding the COVID-19 Pandemic (e.g. social distancing).  I verified, using two identifiers, that I am speaking with Mariah [name] or their authorized healthcare agent. I discussed the limitations, risks, security, and privacy concerns of performing an evaluation and management service by telephone and the potential availability of an in-person appointment in the future. The patient expressed understanding and agreed to proceed.  Location of Patient: Home Location of Provider (nurse):  Provider home  Subjective:    Mariah [name] is a 67 y.o. female patient of Mariah Stade, PA-C who had a [employer] Visit today via telephone. [name] is Retired and lives with their spouse. she does not have any children. she reports that she is socially active and does interact with friends/family regularly. she is moderately physically active and enjoys fishing and walking.  Patient Care Team: Mariah [name] as PCP - General (Family Medicine)     01/06/2023    2:03 PM 04/28/2021   11:22 AM 06/17/2018    4:38 PM 04/21/2017    3:40 PM 02/24/2017    3:12 PM 10/21/2016    2:40 PM 06/15/2015    6:00 PM  Advanced Directives  Does Patient Have a Medical Advance Directive? Yes Yes Yes Yes Yes Yes No  Type of Advance Directive Living will [place];Living will  [place];Living will [place];Living will [place];Living will   Does patient want to make changes to medical advance directive? No - Patient declined No - Patient declined       Copy of [place] in Chart?  Yes - validated most recent copy scanned in chart (See row information)         Hospital Utilization Over the Past 12 Months: # of hospitalizations or ER  visits: 0 # of surgeries: 0  Review of Systems    Patient reports that her overall health is better compared to last year.  History obtained from chart review and the patient  Patient Reported Readings (BP, Pulse, CBG, Weight, etc) none  Pain Assessment Pain : 0-10 Pain Score: 8  Pain Type: Acute pain Pain Location: Knee Pain Orientation: Right Pain Descriptors / Indicators: Aching Pain Onset: Yesterday Pain Frequency: Intermittent Pain Relieving Factors: rest  Pain Relieving Factors: rest  Current Medications & Allergies (verified) Allergies as of 01/06/2023       Reactions   Codeine Itching   Morphine Itching   Poison Ivy Extract    Rash, itching   Zovirax [acyclovir Sodium] Itching        Medication List        Accurate as of January 06, 2023  2:22 PM. If you have any questions, ask your nurse or doctor.          acetaminophen 500 MG tablet Commonly known as: TYLENOL Take 1,000 mg by mouth every 6 (six) hours as needed for fever (pain).   aspirin EC 81 MG tablet Take 81 mg by mouth daily.   atorvastatin 40 MG tablet Commonly known as: LIPITOR Take 40 mg by mouth every Monday, Wednesday, and Friday.   cyclobenzaprine 5 MG tablet Commonly known as: FLEXERIL TAKE 1 TABLET BY MOUTH AT BEDTIME AS NEEDED FOR MUSCLE SPASM   doxycycline 100 MG tablet Commonly known as: VIBRA-TABS Take 1 tablet (100 mg  total) by mouth 2 (two) times daily.   famotidine 20 MG tablet Commonly known as: PEPCID daily.   ipratropium 0.06 % nasal spray Commonly known as: ATROVENT Place 2 sprays into both nostrils 4 (four) times daily.   loratadine 10 MG tablet Commonly known as: CLARITIN Take 10 mg by mouth daily.   MAGNESIUM PO Take by mouth.   metFORMIN 500 MG tablet Commonly known as: GLUCOPHAGE Take 500 mg by mouth daily with breakfast.   MULTIVITAMIN ADULT PO Take by mouth.   mycophenolate 180 MG EC tablet Commonly known as: MYFORTIC TAKE 2 TABLETS BY  MOUTH 2 TIMES DAILY.   ondansetron 8 MG disintegrating tablet Commonly known as: ZOFRAN-ODT Take 1 tablet (8 mg total) by mouth every 8 (eight) hours as needed for nausea.   sotalol 80 MG tablet Commonly known as: BETAPACE Take 80 mg by mouth 2 (two) times daily.   sulfamethoxazole-trimethoprim 400-80 MG tablet Commonly known as: BACTRIM Take 1 tablet by mouth. 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"   Dysrhythmia    afib   Gout    History of kidney stones    Hyperlipidemia    Hypertension    Kidney transplant recipient    Liver cyst    Nephrolithiasis    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[place])    Pneumonia    03/08/17- many years ago   Polycystic kidney disease 11/2016   transplant    PONV (postoperative nausea and vomiting)    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 Remus [name]    LIGATION OF ARTERIOVENOUS  FISTULA Left 03/09/2017   Procedure: LIGATION OF LEFT BASILIC VEIN  TRANSPOSITION;  Surgeon: Mariah Dutch, MD;  Location: [facility];  Service: Vascular;  Laterality: Left;   NEPHRECTOMY  08/03/2021   ORIF ELBOW FRACTURE  10/22/2011   Procedure: OPEN REDUCTION INTERNAL FIXATION (ORIF) ELBOW/OLECRANON FRACTURE;  Surgeon: Mariah Amor, MD;  Location: [facility];  Service: Orthopedics;  Laterality: Left;  open reduction  possible lateral reconstruction with palmaris graft from left or right side.   RADIAL HEAD IMPLANT  09/24/2011   Procedure: RADIAL HEAD IMPLANT;  Surgeon: Mariah Amor, MD;  Location: [facility];  Service: Orthopedics;  Laterality: Left;  left radial head replacement   REPLACEMENT TOTAL KNEE  12/13/2020   Dr. Mayer Camel   TONSILLECTOMY     Family History  Problem Relation Age of Onset   Polycystic kidney disease Father    Heart failure Father    Heart disease Mother        Rheumatic fever   Social History   Socioeconomic History   Marital status: Married    Spouse name: Olin Hauser   Number of children: 0   Years of education: 12   Highest education level: 12th grade  Occupational History   Occupation: Information systems manager: [employer]   Occupation: Financial controller    Comment: part-time  Tobacco Use   Smoking status: Former    Packs/day: 2.00    Years: 24.00    Additional pack years: 0.00    Total pack years: 48.00    Types: Cigarettes    Quit date: 12/12/1996    Years since quitting: 26.0   Smokeless tobacco: Never  Vaping Use   Vaping Use: Never used  Substance and Sexual Activity   Alcohol use: Yes    Alcohol/week: 2.0 standard drinks of alcohol    Types: 2 Glasses of wine per week    Comment: 1 drink a week   Drug use: No   Sexual activity: Yes    Partners: Female  Other Topics Concern   Not on file  Social History Narrative   Lives with her wife. They have one dog. She enjoys fishing and walking.   Social Determinants of Health   Financial Resource Strain: Low Risk  (01/06/2023)    Overall Financial Resource Strain (CARDIA)    Difficulty of Paying Living Expenses: Not hard at all  Food Insecurity: No Food Insecurity (01/06/2023)   Hunger Vital Sign    Worried About Running Out of Food in the Last Year: Never true    Ran Out of Food in the Last Year: Never true  Transportation Needs: No Transportation Needs (01/06/2023)   PRAPARE - Hydrologist (Medical): No    Lack of Transportation (Non-Medical): No  Physical Activity: Sufficiently Active (01/06/2023)   Exercise Vital Sign    Days of Exercise per Week: 3 days    Minutes of Exercise per Session: 50 min  Recent Concern: Physical Activity - Inactive (01/06/2023)   Exercise Vital Sign    Days of Exercise per Week: 0 days    Minutes of Exercise per Session: 0 min  Stress: No Stress Concern Present (01/06/2023)   [place]    Feeling of Stress : Not at all  Social Connections: Moderately Isolated (01/06/2023)   Social Connection and Isolation Panel [NHANES]    Frequency of Communication with Friends and Family: More than three times a week    Frequency of Social Gatherings with Friends and Family: Three times a week    Attends Religious Services: Never    Active Member of Clubs or Organizations: No    Attends Archivist Meetings: Never    Marital Status: Married    Activities of Daily Living    01/06/2023    2:07 PM  In your present state of health, do you have any difficulty performing the following activities:  Hearing? 0  Vision? 0  Difficulty concentrating or making decisions? 0  Walking or climbing stairs? 0  Dressing or bathing? 0  Doing errands, shopping? 0  Preparing Food and eating ? N  Using the Toilet? N  In the  past six months, have you accidently leaked urine? Y  Comment stress incontinence  Do you have problems with loss of bowel control? N  Managing your Medications? N  Managing your  Finances? N  Housekeeping or managing your Housekeeping? N    Patient Education/ Literacy How often do you need to have someone help you when you read instructions, pamphlets, or other written materials from your doctor or pharmacy?: 1 - Never What is the last grade level you completed in school?: 12th grade  Exercise Current Exercise Habits: Home exercise routine, Type of exercise: strength training/weights;stretching, Time (Minutes): 45, Frequency (Times/Week): 3, Weekly Exercise (Minutes/Week): 135, Intensity: Moderate, Exercise limited by: None identified  Diet Patient reports consuming 3 meals a day and 1 snack(s) a day Patient reports that her primary diet is: Regular Patient reports that she does have regular access to food.   Depression Screen    01/06/2023    2:04 PM 10/22/2022   11:36 AM 04/23/2022   10:19 AM 02/02/2022   11:00 AM 04/28/2021   11:23 AM 11/17/2020    1:32 PM 09/09/2017    1:08 PM  PHQ 2/9 Scores  PHQ - 2 Score 0 0 0 0 0 0 0  PHQ- 9 Score  0    0      Fall Risk    01/06/2023    2:03 PM 02/02/2022   11:00 AM 04/28/2021   11:23 AM  Fall Risk   Falls in the past year? 1 0 0  Number falls in past yr: 0 0 0  Injury with Fall? 0 0 0  Risk for fall due to : History of fall(s) No Fall Risks No Fall Risks  Follow up Falls evaluation completed;Education provided;Falls prevention discussed Falls evaluation completed Falls evaluation completed     Objective:  Mariah [name] seemed alert and oriented and she participated appropriately during our telephone visit.  Blood Pressure Weight BMI  BP Readings from Last 3 Encounters:  10/22/22 123/60  09/26/22 128/83  04/23/22 (!) 138/50   Wt Readings from Last 3 Encounters:  10/22/22 205 lb (93 kg)  04/23/22 199 lb (90.3 kg)  02/02/22 196 lb (88.9 kg)   BMI Readings from Last 1 Encounters:  10/22/22 37.49 kg/m    *Unable to obtain current vital signs, weight, and BMI due to telephone visit  type  Hearing/Vision  Mariah [name] did not seem to have difficulty with hearing/understanding during the telephone conversation Reports that she has had a formal eye exam by an eye care professional within the past year Reports that she has not had a formal hearing evaluation within the past year *Unable to fully assess hearing and vision during telephone visit type  Cognitive Function:    01/06/2023    2:11 PM 04/28/2021   11:36 AM  6CIT Screen  What Year? 0 points 0 points  What month? 0 points 0 points  What time? 0 points 0 points  Count back from 20 0 points 0 points  Months in reverse 0 points 0 points  Repeat phrase 0 points 0 points  Total Score 0 points 0 points   (Normal:0-7, Significant for Dysfunction: >8)  Normal Cognitive Function Screening: Yes   Immunization & Health Maintenance Record Immunization History  Administered Date(s) Administered   DTaP 07/27/2011   Fluad Quad(high Dose 65+) 06/23/2022   Hepatitis B 04/29/2016, 06/01/2016, 11/01/2016   Hepatitis B, ADULT 04/29/2016, 06/01/2016, 11/01/2016   Hepatitis B, PED/ADOLESCENT 04/29/2016, 06/01/2016, 11/01/2016   Influenza Split  09/12/2006, 09/19/2007, 10/13/2012   Influenza Whole 09/12/2006, 09/19/2007   Influenza, Quadrivalent, Recombinant, Inj, Pf 06/15/2017   Influenza,inj,Quad PF,6+ Mos 07/05/2013, 06/26/2014, 06/02/2015, 06/01/2016, 07/13/2018, 06/04/2019, 06/30/2020, 06/16/2021   Influenza-Unspecified 07/05/2013, 06/26/2014, 06/02/2015, 06/01/2016, 06/15/2017, 07/13/2018, 06/04/2019   Janssen (J&J) SARS-COV-2 Vaccination 12/14/2019   Moderna Sars-Covid-2 Vaccination 08/06/2020, 11/26/2020, 05/02/2021   PPD Test 04/12/2016   Pfizer Covid-19 Vaccine Bivalent Booster 81yrs & up 07/05/2022   Pneumococcal Conjugate-13 12/20/2017   Pneumococcal Polysaccharide-23 04/29/2016   Tdap 05/02/2021   Zoster Recombinat (Shingrix) 10/20/2021, 04/10/2022   Zoster, Live 12/26/2012    Health Maintenance  Topic Date  Due   COVID-19 Vaccine (6 - 2023-24 season) 01/22/2023 (Originally 08/30/2022)   Pneumonia Vaccine 18+ Years old (71 of 3 - PPSV23 or PCV20) 02/03/2023 (Originally 04/29/2021)   MAMMOGRAM  04/16/2023   Medicare Annual Wellness (AWV)  01/06/2024   COLONOSCOPY (Pts 45-39yrs Insurance coverage will need to be confirmed)  12/09/2027   DTaP/Tdap/Td (3 - Td or Tdap) 05/03/2031   INFLUENZA VACCINE  Completed   DEXA SCAN  Completed   Hepatitis C Screening  Completed   Zoster Vaccines- Shingrix  Completed   HPV VACCINES  Aged Out       Assessment  This is a routine wellness examination for Mariah [name].  Health Maintenance: Due or Overdue There are no preventive care reminders to display for this patient.   JORDYNN FOSHEE does not need a referral for Community Assistance: Care Management:   no Social Work:    no Prescription Assistance:  no Nutrition/Diabetes Education:  no   Plan:  Personalized Goals  Goals Addressed               This Visit's Progress     Patient Stated (pt-stated)        Patient stated that she would like to get stronger       Personalized Health Maintenance & Screening Recommendations  Pneumococcal vaccine   Lung Cancer Screening Recommended: no (Low Dose CT Chest recommended if Age 25-80 years, 30 pack-year currently smoking OR have quit w/in past 15 years) Hepatitis C Screening recommended: no HIV Screening recommended: no  Advanced Directives: Written information was not prepared per patient's request.  Referrals & Orders Orders Placed This Encounter  Procedures   [place]    Follow-up Plan Follow-up with Mariah Stade, PA-C as planned Discuss pneumonia vaccine with your doctor.  Medicare wellness visit in one year.  Patient will access AVS on my chart.   I have personally reviewed and noted the following in the patient's chart:   Medical and social history Use of alcohol, tobacco or illicit drugs  Current medications and  supplements Functional ability and status Nutritional status Physical activity Advanced directives List of other physicians Hospitalizations, surgeries, and ER visits in previous 12 months Vitals Screenings to include cognitive, depression, and falls Referrals and appointments  In addition, I have reviewed and discussed with Mariah [name] certain preventive protocols, quality metrics, and best practice recommendations. A written personalized care plan for preventive services as well as general preventive health recommendations is available and can be mailed to the patient at her request.      Tinnie Gens, RN BSN  01/06/2023

## 2023-01-06 NOTE — Patient Instructions (Addendum)
  [facility] Maintenance Summary and Written Plan of Care  [name] ,  Thank you for allowing me to perform your Medicare Annual Wellness Visit and for your ongoing commitment to your health.   Health Maintenance & Immunization History Health Maintenance  Topic Date Due   COVID-19 Vaccine (6 - 2023-24 season) 01/22/2023 (Originally 08/30/2022)   Pneumonia Vaccine 61+ Years old (74 of 3 - PPSV23 or PCV20) 02/03/2023 (Originally 04/29/2021)   MAMMOGRAM  04/16/2023   Medicare Annual Wellness (AWV)  01/06/2024   COLONOSCOPY (Pts 45-1yrs Insurance coverage will need to be confirmed)  12/09/2027   DTaP/Tdap/Td (3 - Td or Tdap) 05/03/2031   INFLUENZA VACCINE  Completed   DEXA SCAN  Completed   Hepatitis C Screening  Completed   Zoster Vaccines- Shingrix  Completed   HPV VACCINES  Aged Out   Immunization History  Administered Date(s) Administered   DTaP 07/27/2011   Fluad Quad(high Dose 65+) 06/23/2022   Hepatitis B 04/29/2016, 06/01/2016, 11/01/2016   Hepatitis B, ADULT 04/29/2016, 06/01/2016, 11/01/2016   Hepatitis B, PED/ADOLESCENT 04/29/2016, 06/01/2016, 11/01/2016   Influenza Split 09/12/2006, 09/19/2007, 10/13/2012   Influenza Whole 09/12/2006, 09/19/2007   Influenza, Quadrivalent, Recombinant, Inj, Pf 06/15/2017   Influenza,inj,Quad PF,6+ Mos 07/05/2013, 06/26/2014, 06/02/2015, 06/01/2016, 07/13/2018, 06/04/2019, 06/30/2020, 06/16/2021   Influenza-Unspecified 07/05/2013, 06/26/2014, 06/02/2015, 06/01/2016, 06/15/2017, 07/13/2018, 06/04/2019   Janssen (J&J) SARS-COV-2 Vaccination 12/14/2019   Moderna Sars-Covid-2 Vaccination 08/06/2020, 11/26/2020, 05/02/2021   PPD Test 04/12/2016   Pfizer Covid-19 Vaccine Bivalent Booster 60yrs & up 07/05/2022   Pneumococcal Conjugate-13 12/20/2017   Pneumococcal Polysaccharide-23 04/29/2016   Tdap 05/02/2021   Zoster Recombinat (Shingrix) 10/20/2021, 04/10/2022   Zoster, Live 12/26/2012    These are the patient  goals that we discussed:  Goals Addressed               This Visit's Progress     Patient Stated (pt-stated)        Patient stated that she would like to get stronger         This is a list of Health Maintenance Items that are overdue or due now:  Dexa scan Pneumonia vaccine  Orders/Referrals Placed Today: Orders Placed This Encounter  Procedures   DEXAScan    Standing Status:   Future    Standing Expiration Date:   01/06/2024    Scheduling Instructions:     Please call patient to schedule. Last one 04/29/21.    Order Specific Question:   Reason for exam:    Answer:   post menopausal    Order Specific Question:   Preferred imaging location?    Answer:   [facility]   (Contact our referral department at [phone] if you have not spoken with someone about your referral appointment within the next 5 days)    Follow-up Plan Follow-up with Donella Stade, PA-C as planned Discuss pneumonia vaccine with your doctor.  Medicare wellness visit in one year.  Patient will access AVS on my chart.

## 2023-01-06 NOTE — Telephone Encounter (Signed)
Patient is requested, during her medicare wellness visit, a refill for Prednisone 5 mg for incase of a gout flare up.

## 2023-01-10 MED ORDER — PREDNISONE 5 MG PO TABS: 5.0000 mg | ORAL_TABLET | Freq: Every day | ORAL | 0 refills | Status: DC

## 2023-01-10 NOTE — Addendum Note (Signed)
Addended by: Donella Stade on: 01/10/2023 06:47 AM   Modules accepted: Orders

## 2023-02-07 ENCOUNTER — Ambulatory Visit (INDEPENDENT_AMBULATORY_CARE_PROVIDER_SITE_OTHER): Payer: Medicare Other

## 2023-02-07 ENCOUNTER — Ambulatory Visit (INDEPENDENT_AMBULATORY_CARE_PROVIDER_SITE_OTHER): Payer: Medicare Other | Admitting: Sports Medicine

## 2023-02-07 DIAGNOSIS — M7542 Impingement syndrome of left shoulder: Secondary | ICD-10-CM

## 2023-02-07 DIAGNOSIS — M25512 Pain in left shoulder: Secondary | ICD-10-CM | POA: Diagnosis not present

## 2023-02-07 NOTE — Progress Notes (Signed)
    Procedures performed today:    None.  Independent interpretation of notes and tests performed by another provider:   None.  Brief History, Exam, Impression, and Recommendations:    Impingement syndrome of shoulder, left Very pleasant 67 year old female, renal transplant recipient so unable to use NSAIDs. Allergic to narcotics. For the past several months she has had increasing pain over the deltoid worse with abduction, on exam she has significant positive impingement signs and weakness to abduction concerning for impingement syndrome/rotator cuff tearing. We will add some shoulder x-rays, home physical therapy, avoidance of overhead activities in the gym and return to see me in 6 weeks, subacromial injection if not better.    ____________________________________________ Ihor Austin. Benjamin Stain, M.D., ABFM., CAQSM., AME. Primary Care and Sports Medicine [facility]  Adjunct Professor of Family Medicine  [place] [employer]  Restaurant manager, fast food

## 2023-02-07 NOTE — Assessment & Plan Note (Signed)
Very pleasant 67 year old female, renal transplant recipient so unable to use NSAIDs. Allergic to narcotics. For the past several months she has had increasing pain over the deltoid worse with abduction, on exam she has significant positive impingement signs and weakness to abduction concerning for impingement syndrome/rotator cuff tearing. We will add some shoulder x-rays, home physical therapy, avoidance of overhead activities in the gym and return to see me in 6 weeks, subacromial injection if not better.

## 2023-03-10 DIAGNOSIS — M25562 Pain in left knee: Secondary | ICD-10-CM | POA: Diagnosis not present

## 2023-03-11 DIAGNOSIS — Z85828 Personal history of other malignant neoplasm of skin: Secondary | ICD-10-CM | POA: Diagnosis not present

## 2023-03-11 DIAGNOSIS — L820 Inflamed seborrheic keratosis: Secondary | ICD-10-CM | POA: Diagnosis not present

## 2023-03-14 ENCOUNTER — Ambulatory Visit: Payer: Medicare Other | Admitting: Sports Medicine

## 2023-03-21 ENCOUNTER — Ambulatory Visit: Payer: Medicare Other | Admitting: Sports Medicine

## 2023-03-21 DIAGNOSIS — D849 Immunodeficiency, unspecified: Secondary | ICD-10-CM | POA: Diagnosis not present

## 2023-03-21 DIAGNOSIS — Z940 Kidney transplant status: Secondary | ICD-10-CM | POA: Diagnosis not present

## 2023-03-24 DIAGNOSIS — Z940 Kidney transplant status: Secondary | ICD-10-CM | POA: Diagnosis not present

## 2023-03-24 DIAGNOSIS — D849 Immunodeficiency, unspecified: Secondary | ICD-10-CM | POA: Diagnosis not present

## 2023-03-29 ENCOUNTER — Other Ambulatory Visit: Payer: Self-pay | Admitting: Physician Assistant

## 2023-03-29 DIAGNOSIS — M62838 Other muscle spasm: Secondary | ICD-10-CM

## 2023-04-07 ENCOUNTER — Other Ambulatory Visit: Payer: Self-pay | Admitting: Physician Assistant

## 2023-04-07 DIAGNOSIS — F419 Anxiety disorder, unspecified: Secondary | ICD-10-CM

## 2023-04-13 DIAGNOSIS — Z5181 Encounter for therapeutic drug level monitoring: Secondary | ICD-10-CM | POA: Diagnosis not present

## 2023-04-13 DIAGNOSIS — I480 Paroxysmal atrial fibrillation: Secondary | ICD-10-CM | POA: Diagnosis not present

## 2023-04-13 DIAGNOSIS — Z79899 Other long term (current) drug therapy: Secondary | ICD-10-CM | POA: Diagnosis not present

## 2023-04-13 DIAGNOSIS — Z95818 Presence of other cardiac implants and grafts: Secondary | ICD-10-CM | POA: Diagnosis not present

## 2023-04-13 DIAGNOSIS — I499 Cardiac arrhythmia, unspecified: Secondary | ICD-10-CM | POA: Diagnosis not present

## 2023-04-14 DIAGNOSIS — I499 Cardiac arrhythmia, unspecified: Secondary | ICD-10-CM | POA: Diagnosis not present

## 2023-04-25 ENCOUNTER — Ambulatory Visit: Payer: Self-pay | Admitting: Licensed Clinical Social Worker

## 2023-04-25 NOTE — Patient Instructions (Signed)
Visit Information  Thank you for taking time to visit with me today. Please don't hesitate to contact me if I can be of assistance to you.   Following are the goals we discussed today:   Goals Addressed             This Visit's Progress    Patient Stated she had kidney transplant in 2018.  She has some stress in managing medical needs       Interventions:  Spoke with client about client needs Client said she had kidney transplant in 2018.  She receives care with [facility]. She has support of kidney transplant team She said she has blood draws 3 times per year.  She sees Tandy Gaw PA also through [facility] system.  LCSW and client spoke of client support with Tandy Gaw, PA Discussed program support of RN, LCSW, Pharmacist. Client said she sees [place] PA about 2 times per year. Client was appreciative of call.  LCSW encouraged client to talk with Tandy Gaw PA as needed about Care Coordination program support. Encouraged client to call LCSW as needed at [phone].       Client has LCSW name and phone number and will call LCSW as needed related to program support   Please call the care guide team at [phone] if you need to cancel or reschedule your appointment.   If you are experiencing a Mental Health or Behavioral Health Crisis or need someone to talk to, please go to [facility] [address] [place] [phone])   The patient verbalized understanding of instructions, educational materials, and care plan provided today and DECLINED offer to receive copy of patient instructions, educational materials, and care plan.   The patient has been provided with contact information for the care management team and has been advised to call with any health related questions or concerns.   Kelton Pillar.Sumie Remsen MSW, LCSW Licensed Visual merchandiser [facility] Care Management [phone]

## 2023-04-25 NOTE — Patient Outreach (Signed)
  Care Coordination   Initial Visit Note   04/25/2023 Name: Mariah [name] MRN: [id number] DOB: [birth year]  Mariah [name] is a 67 y.o. year old female who sees [place], Lonna Cobb, [place] for primary care. I spoke with  Jones Bales by phone today.  What matters to the patients health and wellness today? Patient had kidney transplant in 2018. She has some   stress in managing medical needs    Goals Addressed             This Visit's Progress    Patient Stated she had kidney transplant in 2018.  She has some stress in managing medical needs       Interventions:  Spoke with client about client needs Client said she had kidney transplant in 2018.  She receives care with [facility]. She has support of kidney transplant team She said she has blood draws 3 times per year.  She sees Tandy Gaw PA also through [facility] system.  LCSW and client spoke of client support with Tandy Gaw, PA Discussed program support of RN, LCSW, Pharmacist. Client said she sees [place] PA about 2 times per year. Client was appreciative of call.  LCSW encouraged client to talk with Tandy Gaw PA as needed about Care Coordination program support. Encouraged client to call LCSW as needed at [phone].        SDOH assessments and interventions completed:  Yes  SDOH Interventions Today    Flowsheet Row Most Recent Value  SDOH Interventions   Physical Activity Interventions Other (Comments)  [may have decreased activities]  Stress Interventions Other (Comment)  [history of kidney transplant. stress in managing medical needs]        Care Coordination Interventions:  Yes, provided    Interventions Today    Flowsheet Row Most Recent Value  Chronic Disease   Chronic disease during today's visit Other  [spoke with client about client needs]  General Interventions   General Interventions Discussed/Reviewed General Interventions Discussed, Community Resources  [reviewed program support]   Exercise Interventions   Exercise Discussed/Reviewed Physical Activity  Education Interventions   Education Provided Provided Education  Provided Verbal Education On [employer]  Mental Health Interventions   Mental Health Discussed/Reviewed Coping Strategies  [no mood issues mentioned]  Safety Interventions   Safety Discussed/Reviewed Fall Risk       Follow up plan: client has LCSW name and phone number and will call LCSW as needed for program information   Encounter Outcome:  Pt. Visit Completed   Kelton Pillar.Nanetta Wiegman MSW, LCSW Licensed Visual merchandiser [facility] Care Management [phone]

## 2023-04-27 ENCOUNTER — Other Ambulatory Visit: Payer: Self-pay | Admitting: Obstetrics & Gynecology

## 2023-04-27 DIAGNOSIS — Z1231 Encounter for screening mammogram for malignant neoplasm of breast: Secondary | ICD-10-CM

## 2023-05-04 ENCOUNTER — Ambulatory Visit (INDEPENDENT_AMBULATORY_CARE_PROVIDER_SITE_OTHER): Payer: Medicare Other

## 2023-05-04 DIAGNOSIS — M810 Age-related osteoporosis without current pathological fracture: Secondary | ICD-10-CM | POA: Diagnosis not present

## 2023-05-04 DIAGNOSIS — Z780 Asymptomatic menopausal state: Secondary | ICD-10-CM | POA: Diagnosis not present

## 2023-05-04 DIAGNOSIS — Z0000 Encounter for general adult medical examination without abnormal findings: Secondary | ICD-10-CM

## 2023-05-06 ENCOUNTER — Ambulatory Visit (INDEPENDENT_AMBULATORY_CARE_PROVIDER_SITE_OTHER): Payer: Medicare Other | Admitting: Obstetrics & Gynecology

## 2023-05-06 ENCOUNTER — Encounter (HOSPITAL_BASED_OUTPATIENT_CLINIC_OR_DEPARTMENT_OTHER): Payer: Self-pay | Admitting: Obstetrics & Gynecology

## 2023-05-06 VITALS — BP 144/68 | HR 67 | Ht 61.5 in | Wt 199.4 lb

## 2023-05-06 DIAGNOSIS — D849 Immunodeficiency, unspecified: Secondary | ICD-10-CM

## 2023-05-06 DIAGNOSIS — Z01419 Encounter for gynecological examination (general) (routine) without abnormal findings: Secondary | ICD-10-CM

## 2023-05-06 DIAGNOSIS — Z780 Asymptomatic menopausal state: Secondary | ICD-10-CM

## 2023-05-06 DIAGNOSIS — N952 Postmenopausal atrophic vaginitis: Secondary | ICD-10-CM | POA: Diagnosis not present

## 2023-05-06 DIAGNOSIS — Z940 Kidney transplant status: Secondary | ICD-10-CM

## 2023-05-06 NOTE — Progress Notes (Unsigned)
67 y.o. G0P0000 Married White or Caucasian female here for breast and pelvic exam.  I am also following her for h/o PMP status.  She is about 6 years from renal transplant.  Denies vaginal bleeding.  Patient's last menstrual period was 07/11/2009 (approximate).          H/O STD:  no  Health Maintenance: PCP:  Tandy Gaw, PA.   Vaccines are up to date:  yes including pneumonia which she had prior to her transplant Colonoscopy:  2/229/2024 MMG:  scheduled 8/14 BMD:  05/04/2023 Last pap smear:  04/2022.   H/o abnormal pap smear:  no   reports that she quit smoking about 26 years ago. Her smoking use included cigarettes. She started smoking about 50 years ago. She has a 48 pack-year smoking history. She has never used smokeless tobacco. She reports current alcohol use of about 2.0 standard drinks of alcohol per week. She reports that she does not use drugs.  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)    Pneumonia    03/08/17- many years ago   Polycystic kidney disease 11/2016   transplant    PONV (postoperative nausea and vomiting)    Post-transplant diabetes mellitus (HCC)    Pre-diabetes    due to antirejection medication   Ruptured cyst of kidney    SDH (subdural hematoma) (HCC)     Past Surgical History:  Procedure Laterality Date   BASCILIC VEIN TRANSPOSITION Left 09/07/2016   Procedure: BASILIC VEIN TRANSPOSITION LEFT UPPER ARM;  Surgeon: Sherren Kerns, MD;  Location: [facility] OR;  Service: Vascular;  Laterality: Left;   COLONOSCOPY  03/2004   repeat 10 years   Evacuation of subdural hematoma  11/26/2000    KIDNEY STONE SURGERY     Cysto   KIDNEY TRANSPLANT     Nov 23, 2016   LEFT ATRIAL APPENDAGE OCCLUSION  06/03/2017   Dr. Gary Fleet    LIGATION OF ARTERIOVENOUS  FISTULA Left 03/09/2017   Procedure: LIGATION OF LEFT BASILIC VEIN TRANSPOSITION;  Surgeon: Sherren Kerns, MD;  Location[facility] OR;  Service: Vascular;  Laterality: Left;   NEPHRECTOMY  08/03/2021   ORIF ELBOW FRACTURE  10/22/2011   Procedure: OPEN REDUCTION INTERNAL FIXATION (ORIF) ELBOW/OLECRANON FRACTURE;  Surgeon: Marlowe Shores, MD;  Location: [facility];  Service: Orthopedics;  Laterality: Left;  open reduction  possible lateral reconstruction with palmaris graft from left or right side.   RADIAL HEAD IMPLANT  09/24/2011   Procedure: RADIAL HEAD IMPLANT;  Surgeon: Marlowe Shores, MD;  Location: [facility];  Service: Orthopedics;  Laterality: Left;  left radial head replacement   REPLACEMENT TOTAL KNEE  12/13/2020   Dr. Turner Daniels   TONSILLECTOMY      Current Outpatient Medications  Medication Sig Dispense Refill   acetaminophen (TYLENOL) 500 MG tablet Take 1,000 mg by mouth every 6 (six) hours as needed for fever (pain).     aspirin EC 81 MG tablet Take 81 mg by mouth daily.  atorvastatin (LIPITOR) 40 MG tablet Take 40 mg by mouth every Monday, Wednesday, and Friday.      cyclobenzaprine (FLEXERIL) 5 MG tablet TAKE 1 TABLET BY MOUTH AT BEDTIME AS NEEDED FOR MUSCLE SPASM 90 tablet 0   famotidine (PEPCID) 20 MG tablet daily.     ipratropium (ATROVENT) 0.06 % nasal spray Place 2 sprays into both nostrils 4 (four) times daily. 15 mL 0   loratadine (CLARITIN) 10 MG tablet Take 10 mg by mouth daily.     MAGNESIUM PO Take by mouth.     metFORMIN (GLUCOPHAGE) 500 MG tablet Take 500 mg by mouth daily with breakfast.      Multiple Vitamin (MULTIVITAMIN ADULT PO) Take by mouth.     mycophenolate (MYFORTIC) 180 MG EC tablet TAKE 2 TABLETS BY MOUTH 2 TIMES DAILY.     ondansetron (ZOFRAN-ODT) 8 MG  disintegrating tablet Take 1 tablet (8 mg total) by mouth every 8 (eight) hours as needed for nausea. 20 tablet 1   predniSONE (DELTASONE) 5 MG tablet Take 1 tablet (5 mg total) by mouth daily with breakfast. For gout flare. 5 tablet 0   sulfamethoxazole-trimethoprim (BACTRIM) 400-80 MG tablet Take 1 tablet by mouth. M,W,F     tacrolimus (PROGRAF) 1 MG capsule Take 5 mg in the AM and 4 mg in the PM by mouth daily     venlafaxine XR (EFFEXOR-XR) 37.5 MG 24 hr capsule TAKE 1 CAPSULE BY MOUTH EVERY OTHER DAY 45 capsule 0   sotalol (BETAPACE) 80 MG tablet Take 80 mg by mouth 2 (two) times daily.      No current facility-administered medications for this visit.    Family History  Problem Relation Age of Onset   Polycystic kidney disease Father    Heart failure Father    Heart disease Mother        Rheumatic fever    Review of Systems  Constitutional: Negative.   Genitourinary: Negative.     Exam:   BP (!) 144/68 (BP Location: Right Arm, Patient Position: Sitting, Cuff Size: Large)   Pulse 67   Ht 5' 1.5" (1.562 m) Comment: reported  Wt 199 lb 6.4 oz (90.4 kg)   LMP 07/11/2009 (Approximate)   BMI 37.07 kg/m   Height: 5' 1.5" (156.2 cm) (reported)  General appearance: alert, cooperative and appears stated age Breasts: normal appearance, no masses or tenderness Abdomen: soft, non-tender; bowel sounds normal; no masses,  no organomegaly Lymph nodes: Cervical, supraclavicular, and axillary nodes normal.  No abnormal inguinal nodes palpated Neurologic: Grossly normal  Pelvic: External genitalia:  no lesions              Urethra:  normal appearing urethra with no masses, tenderness or lesions              Bartholins and Skenes: normal                 Vagina: normal appearing vagina with atrophic changes .and no discharge, no lesions              Cervix: no lesions              Pap taken: No. Bimanual Exam:  Uterus:  normal size, contour, position, consistency, mobility, non-tender               Adnexa: normal adnexa and no mass, fullness, tenderness               Rectovaginal: Confirms  Anus:  normal sphincter tone, no lesions  Chaperone, Raechel Ache, RN, was present for exam.  Assessment/Plan: 1. Encntr for gyn exam (general) (routine) w/o abn findings - Pap smear neg 2022 - Mammogram *** - Colonoscopy *** - Bone mineral density *** - lab work done with PCP, Dr. [name] Kitchen - vaccines reviewed/updated   2. Postmenopausal ***  3. Vaginal atrophy ***  4. Immunosuppression (HCC) ***  5. History of renal transplant ***

## 2023-05-06 NOTE — Progress Notes (Signed)
Mariah [name],   Your bone density has improved in the last 2 years from -2.7 to -2.4. GREAT news.  No change to treatment plan.  Recheck in 2 years.

## 2023-05-25 ENCOUNTER — Ambulatory Visit (INDEPENDENT_AMBULATORY_CARE_PROVIDER_SITE_OTHER): Payer: Medicare Other

## 2023-05-25 DIAGNOSIS — Z1231 Encounter for screening mammogram for malignant neoplasm of breast: Secondary | ICD-10-CM

## 2023-06-14 ENCOUNTER — Ambulatory Visit: Payer: Self-pay | Admitting: Licensed Clinical Social Worker

## 2023-06-14 NOTE — Patient Outreach (Signed)
  Care Coordination   Follow Up Visit Note   06/14/2023 Name: MALINDA MCCROSKEY MRN: [id number] DOB: 08-23-1956  ARSHDEEP ARRIAZA is a 67 y.o. year old female who sees [place], Lonna Cobb, [place] for primary care. I spoke with  Jones Bales by phone today.  What matters to the patients health and wellness today? Patient had a kidney transplant in 2018.  She has some stress in managing medical needs    Goals Addressed             This Visit's Progress    Patient Stated she had kidney transplant in 2018.  She has some stress in managing medical needs       Interventions:  Spoke with client via phone about her current status. Client said she had kidney transplant in 2018.  She receives care with [facility]. She has support of kidney transplant team She sees Tandy Gaw PA also through [facility] system.  LCSW and client spoke of client support with Tandy Gaw, PA Discussed program support of RN, LCSW, Pharmacist. Discussed medication procurement. Client said she felt that her basic needs were met at present. She is appreciative of help with [facility] transplant team Encouraged client to call LCSW as needed at [phone].        SDOH assessments and interventions completed:  Yes  SDOH Interventions Today    Flowsheet Row Most Recent Value  SDOH Interventions   Physical Activity Interventions Other (Comments)  [may have decreased activity level]  Stress Interventions Other (Comment)  [may have some stress in managing medical needs]        Care Coordination Interventions:  Yes, provided   Follow up plan: Follow up call scheduled for 08/08/23 at 3:30 PM     Encounter Outcome:  Pt. Visit Completed   Kelton Pillar.Autumm Hattery MSW, LCSW Licensed Visual merchandiser [facility] Care Management [phone]

## 2023-06-14 NOTE — Patient Instructions (Signed)
Visit Information  Thank you for taking time to visit with me today. Please don't hesitate to contact me if I can be of assistance to you.   Following are the goals we discussed today:   Goals Addressed             This Visit's Progress    Patient Stated she had kidney transplant in 2018.  She has some stress in managing medical needs       Interventions:  Spoke with client via phone about her current status. Client said she had kidney transplant in 2018.  She receives care with [facility]. She has support of kidney transplant team She sees Tandy Gaw PA also through [facility] system.  LCSW and client spoke of client support with Tandy Gaw, PA Discussed program support of RN, LCSW, Pharmacist. Discussed medication procurement. Client said she felt that her basic needs were met at present. She is appreciative of help with [facility] transplant team Encouraged client to call LCSW as needed at [phone].        Our next appointment is by telephone on 08/08/23 at 3:30 PM   Please call the care guide team at [phone] if you need to cancel or reschedule your appointment.   If you are experiencing a Mental Health or Behavioral Health Crisis or need someone to talk to, please go to [facility] [address] [place] [phone])   The patient verbalized understanding of instructions, educational materials, and care plan provided today and DECLINED offer to receive copy of patient instructions, educational materials, and care plan.   The patient has been provided with contact information for the care management team and has been advised to call with any health related questions or concerns.   Kelton Pillar.Hanan Moen MSW, LCSW Licensed Visual merchandiser [facility] Care Management [phone]

## 2023-07-05 ENCOUNTER — Other Ambulatory Visit: Payer: Self-pay | Admitting: Physician Assistant

## 2023-07-05 DIAGNOSIS — F419 Anxiety disorder, unspecified: Secondary | ICD-10-CM

## 2023-07-07 ENCOUNTER — Other Ambulatory Visit: Payer: Self-pay | Admitting: Physician Assistant

## 2023-07-07 DIAGNOSIS — M62838 Other muscle spasm: Secondary | ICD-10-CM

## 2023-07-08 ENCOUNTER — Encounter: Payer: Self-pay | Admitting: Physician Assistant

## 2023-07-08 DIAGNOSIS — M62838 Other muscle spasm: Secondary | ICD-10-CM

## 2023-07-21 DIAGNOSIS — Z940 Kidney transplant status: Secondary | ICD-10-CM | POA: Diagnosis not present

## 2023-07-21 DIAGNOSIS — D849 Immunodeficiency, unspecified: Secondary | ICD-10-CM | POA: Diagnosis not present

## 2023-07-25 DIAGNOSIS — Z79899 Other long term (current) drug therapy: Secondary | ICD-10-CM | POA: Diagnosis not present

## 2023-07-25 DIAGNOSIS — N19 Unspecified kidney failure: Secondary | ICD-10-CM | POA: Diagnosis not present

## 2023-07-25 DIAGNOSIS — M109 Gout, unspecified: Secondary | ICD-10-CM | POA: Diagnosis not present

## 2023-07-25 DIAGNOSIS — M1A9XX0 Chronic gout, unspecified, without tophus (tophi): Secondary | ICD-10-CM | POA: Diagnosis not present

## 2023-07-29 DIAGNOSIS — D849 Immunodeficiency, unspecified: Secondary | ICD-10-CM | POA: Diagnosis not present

## 2023-07-29 DIAGNOSIS — Z940 Kidney transplant status: Secondary | ICD-10-CM | POA: Diagnosis not present

## 2023-08-03 DIAGNOSIS — M47816 Spondylosis without myelopathy or radiculopathy, lumbar region: Secondary | ICD-10-CM | POA: Diagnosis not present

## 2023-08-03 DIAGNOSIS — M545 Low back pain, unspecified: Secondary | ICD-10-CM | POA: Diagnosis not present

## 2023-08-03 DIAGNOSIS — N3000 Acute cystitis without hematuria: Secondary | ICD-10-CM | POA: Diagnosis not present

## 2023-08-09 DIAGNOSIS — H43393 Other vitreous opacities, bilateral: Secondary | ICD-10-CM | POA: Diagnosis not present

## 2023-08-09 DIAGNOSIS — H5213 Myopia, bilateral: Secondary | ICD-10-CM | POA: Diagnosis not present

## 2023-08-09 DIAGNOSIS — H2513 Age-related nuclear cataract, bilateral: Secondary | ICD-10-CM | POA: Diagnosis not present

## 2023-08-09 DIAGNOSIS — H524 Presbyopia: Secondary | ICD-10-CM | POA: Diagnosis not present

## 2023-08-09 DIAGNOSIS — H52203 Unspecified astigmatism, bilateral: Secondary | ICD-10-CM | POA: Diagnosis not present

## 2023-08-09 DIAGNOSIS — H43813 Vitreous degeneration, bilateral: Secondary | ICD-10-CM | POA: Diagnosis not present

## 2023-08-19 ENCOUNTER — Encounter: Payer: Self-pay | Admitting: Sports Medicine

## 2023-08-19 ENCOUNTER — Other Ambulatory Visit (INDEPENDENT_AMBULATORY_CARE_PROVIDER_SITE_OTHER): Payer: Medicare Other

## 2023-08-19 ENCOUNTER — Ambulatory Visit (INDEPENDENT_AMBULATORY_CARE_PROVIDER_SITE_OTHER): Payer: Medicare Other | Admitting: Sports Medicine

## 2023-08-19 DIAGNOSIS — M25561 Pain in right knee: Secondary | ICD-10-CM

## 2023-08-19 DIAGNOSIS — D849 Immunodeficiency, unspecified: Secondary | ICD-10-CM | POA: Diagnosis not present

## 2023-08-19 DIAGNOSIS — Z940 Kidney transplant status: Secondary | ICD-10-CM | POA: Diagnosis not present

## 2023-08-19 DIAGNOSIS — M1712 Unilateral primary osteoarthritis, left knee: Secondary | ICD-10-CM | POA: Diagnosis not present

## 2023-08-19 DIAGNOSIS — G8929 Other chronic pain: Secondary | ICD-10-CM

## 2023-08-19 MED ORDER — TRIAMCINOLONE ACETONIDE 40 MG/ML IJ SUSP
40.0000 mg | Freq: Once | INTRAMUSCULAR | Status: AC
  Administered 2023-08-22: 40 mg via INTRAMUSCULAR

## 2023-08-19 NOTE — Assessment & Plan Note (Signed)
Very pleasant 67 year old female, chronic left knee pain, moderate swelling, she is status post right TKA, not only she having pain but she is also having mechanical symptoms, popping, locking, catching. She has positive McMurray's sign, effusion and tenderness at the medial joint line. Today we did an injection, we will also get an MRI due to mechanical symptoms.

## 2023-08-19 NOTE — Progress Notes (Signed)
    Procedures performed today:    Procedure: Real-time Ultrasound Guided injection of the left knee Device: Samsung HS60  Verbal informed consent obtained.  Time-out conducted.  Noted no overlying erythema, induration, or other signs of local infection.  Skin prepped in a sterile fashion.  Local anesthesia: Topical Ethyl chloride.  With sterile technique and under real time ultrasound guidance: moderate effusion noted, 1 cc Kenalog 40, 2 cc lidocaine, 2 cc bupivacaine injected easily Completed without difficulty  Advised to call if fevers/chills, erythema, induration, drainage, or persistent bleeding.  Images permanently stored and available for review in PACS.  Impression: Technically successful ultrasound guided injection.  Independent interpretation of notes and tests performed by another provider:   None.  Brief History, Exam, Impression, and Recommendations:    Primary osteoarthritis of left knee, status post right total knee arthroplasty Very pleasant 67 year old female, chronic left knee pain, moderate swelling, she is status post right TKA, not only she having pain but she is also having mechanical symptoms, popping, locking, catching. She has positive McMurray's sign, effusion and tenderness at the medial joint line. Today we did an injection, we will also get an MRI due to mechanical symptoms.    ____________________________________________ Ihor Austin. Benjamin Stain, M.D., ABFM., CAQSM., AME. Primary Care and Sports Medicine [facility]  Adjunct Professor of Family Medicine  [employer]  Restaurant manager, fast food

## 2023-08-22 DIAGNOSIS — M25561 Pain in right knee: Secondary | ICD-10-CM | POA: Diagnosis not present

## 2023-08-22 DIAGNOSIS — G8929 Other chronic pain: Secondary | ICD-10-CM | POA: Diagnosis not present

## 2023-08-25 ENCOUNTER — Encounter: Payer: Self-pay | Admitting: Sports Medicine

## 2023-08-30 ENCOUNTER — Ambulatory Visit: Payer: Medicare Other

## 2023-08-30 ENCOUNTER — Other Ambulatory Visit: Payer: Self-pay | Admitting: Sports Medicine

## 2023-08-30 DIAGNOSIS — M1712 Unilateral primary osteoarthritis, left knee: Secondary | ICD-10-CM | POA: Diagnosis not present

## 2023-08-30 DIAGNOSIS — M25562 Pain in left knee: Secondary | ICD-10-CM | POA: Diagnosis not present

## 2023-08-30 DIAGNOSIS — G8929 Other chronic pain: Secondary | ICD-10-CM

## 2023-08-30 DIAGNOSIS — M25462 Effusion, left knee: Secondary | ICD-10-CM | POA: Diagnosis not present

## 2023-09-12 ENCOUNTER — Encounter: Payer: Self-pay | Admitting: Sports Medicine

## 2023-09-14 ENCOUNTER — Ambulatory Visit (INDEPENDENT_AMBULATORY_CARE_PROVIDER_SITE_OTHER): Payer: Medicare Other | Admitting: Physician Assistant

## 2023-09-14 ENCOUNTER — Encounter: Payer: Self-pay | Admitting: Physician Assistant

## 2023-09-14 VITALS — BP 119/94 | HR 71 | Resp 14 | Ht 61.5 in | Wt 199.8 lb

## 2023-09-14 DIAGNOSIS — J0100 Acute maxillary sinusitis, unspecified: Secondary | ICD-10-CM

## 2023-09-14 DIAGNOSIS — M62838 Other muscle spasm: Secondary | ICD-10-CM | POA: Diagnosis not present

## 2023-09-14 DIAGNOSIS — F419 Anxiety disorder, unspecified: Secondary | ICD-10-CM

## 2023-09-14 DIAGNOSIS — R051 Acute cough: Secondary | ICD-10-CM | POA: Diagnosis not present

## 2023-09-14 MED ORDER — HYDROCOD POLI-CHLORPHE POLI ER 10-8 MG/5ML PO SUER: 5.0000 mL | Freq: Two times a day (BID) | ORAL | 0 refills | Status: DC | PRN

## 2023-09-14 MED ORDER — VENLAFAXINE HCL ER 37.5 MG PO CP24: ORAL_CAPSULE | ORAL | 1 refills | Status: DC

## 2023-09-14 MED ORDER — CYCLOBENZAPRINE HCL 5 MG PO TABS: ORAL_TABLET | ORAL | 1 refills | Status: DC

## 2023-09-14 MED ORDER — FLUTICASONE PROPIONATE 50 MCG/ACT NA SUSP: 2.0000 | Freq: Every day | NASAL | 0 refills | Status: DC

## 2023-09-14 MED ORDER — DOXYCYCLINE HYCLATE 100 MG PO TABS: 100.0000 mg | ORAL_TABLET | Freq: Two times a day (BID) | ORAL | 0 refills | Status: DC

## 2023-09-14 NOTE — Progress Notes (Signed)
Acute Office Visit  Subjective:     Patient ID: Mariah [name], female    DOB: 01-Sep-1956, 67 y.o.   MRN: [id number]  Chief Complaint  Patient presents with   Sinus Problem    Sinus Problem Associated symptoms include congestion, coughing, ear pain and a sore throat. Pertinent negatives include no headaches.   67 yo female is in today for congestion, cough, sinus pressure, sore throat, and PND for the past 3 days. Wife was recently sick. Also reports nausea and ear pressure. She has copious nasal mucus that is changing color. No fever or headaches. Taking tylenol and benadryl. She is not taking prednisone.   Review of Systems  Constitutional:  Negative for fever.  HENT:  Positive for congestion, ear pain, sinus pain and sore throat.   Respiratory:  Positive for cough.   Neurological:  Negative for headaches.       Objective:    BP (!) 119/94   Pulse 71   Resp 14   Ht 5' 1.5" (1.562 m)   Wt 199 lb 12.8 oz (90.6 kg)   LMP 07/11/2009 (Approximate)   SpO2 95%   BMI 37.14 kg/m   .[name] Kitchen    09/14/2023   12:41 PM 05/06/2023   10:16 AM 01/06/2023    2:04 PM 10/22/2022   11:36 AM 04/23/2022   10:19 AM  Depression screen PHQ 2/9  Decreased Interest 0 0 0 0 0  Down, Depressed, Hopeless 0 0 0 0 0  PHQ - 2 Score 0 0 0 0 0  Altered sleeping 0   0   Tired, decreased energy 0   0   Change in appetite 0   0   Feeling bad or failure about yourself  0   0   Trouble concentrating 0   0   Moving slowly or fidgety/restless 0   0   Suicidal thoughts 0   0   PHQ-9 Score 0   0   Difficult doing work/chores Not difficult at all   Not difficult at all    .[name] Kitchen    09/14/2023   12:41 PM 10/22/2022   11:36 AM 11/17/2020    1:32 PM  GAD 7 : Generalized Anxiety Score  Nervous, Anxious, on Edge 0 0 0  Control/stop worrying 0 0 0  Worry too much - different things 0 0 0  Trouble relaxing 0 0 0  Restless 0 0 0  Easily annoyed or irritable 0 0 0  Afraid - awful might happen 0 0 0  Total GAD 7  Score 0 0 0  Anxiety Difficulty Not difficult at all Not difficult at all Not difficult at all      Physical Exam Constitutional:      Appearance: Normal appearance. She is obese.  HENT:     Head: Normocephalic.     Comments: Tenderness over right maxillary sinuses    Right Ear: Ear canal and external ear normal.     Left Ear: Ear canal and external ear normal.     Ears:     Comments: Slightly dull TMs    Nose:     Right Turbinates: Swollen (red).     Left Turbinates: Swollen (red).     Right Sinus: Maxillary sinus tenderness present.     Mouth/Throat:     Mouth: Mucous membranes are moist.     Pharynx: Posterior oropharyngeal erythema present. No oropharyngeal exudate.  Eyes:     Conjunctiva/sclera: Conjunctivae normal.  Neck:  Vascular: No carotid bruit.  Cardiovascular:     Rate and Rhythm: Normal rate and regular rhythm.  Pulmonary:     Effort: Pulmonary effort is normal.     Breath sounds: Normal breath sounds.  Musculoskeletal:     Cervical back: Normal range of motion and neck supple. Tenderness present. No rigidity.     Right lower leg: No edema.     Left lower leg: No edema.  Lymphadenopathy:     Cervical: Cervical adenopathy present.  Neurological:     General: No focal deficit present.     Mental Status: She is alert and oriented to person, place, and time.  Psychiatric:        Mood and Affect: Mood normal.       Assessment & Plan:  [name] KitchenMarland KitchenDelmira was seen today for sinus problem.  Diagnoses and all orders for this visit:  Acute non-recurrent maxillary sinusitis -     fluticasone (FLONASE) 50 MCG/ACT nasal spray; Place 2 sprays into both nostrils daily. -     doxycycline (VIBRA-TABS) 100 MG tablet; Take 1 tablet (100 mg total) by mouth 2 (two) times daily.  Anxiety -     venlafaxine XR (EFFEXOR-XR) 37.5 MG 24 hr capsule; TAKE 1 CAPSULE BY MOUTH EVERY OTHER DAY  Muscle spasm -     cyclobenzaprine (FLEXERIL) 5 MG tablet; TAKE 1 TABLET BY MOUTH AT  BEDTIME AS NEEDED FOR MUSCLE SPASM  Acute cough -     chlorpheniramine-HYDROcodone (TUSSIONEX) 10-8 MG/5ML; Take 5 mLs by mouth every 12 (twelve) hours as needed.    Due to PMH of bacterial sinus infections, prescribed doxycycline due to interactions with med list and zpak and augmentin.  Prescribed Flonase for congestion and inflammation. Prescribed Tussionex for cough.  Refilled Effexor for mood and flexeril for as needed muscle spasms. PHQ/GAD to goal.    Tandy Gaw, PA-C

## 2023-09-14 NOTE — Patient Instructions (Signed)

## 2023-09-16 DIAGNOSIS — D1801 Hemangioma of skin and subcutaneous tissue: Secondary | ICD-10-CM | POA: Diagnosis not present

## 2023-09-16 DIAGNOSIS — L821 Other seborrheic keratosis: Secondary | ICD-10-CM | POA: Diagnosis not present

## 2023-09-16 DIAGNOSIS — L820 Inflamed seborrheic keratosis: Secondary | ICD-10-CM | POA: Diagnosis not present

## 2023-09-16 DIAGNOSIS — Z85828 Personal history of other malignant neoplasm of skin: Secondary | ICD-10-CM | POA: Diagnosis not present

## 2023-09-16 DIAGNOSIS — D849 Immunodeficiency, unspecified: Secondary | ICD-10-CM | POA: Diagnosis not present

## 2023-09-30 ENCOUNTER — Ambulatory Visit: Payer: Medicare Other | Admitting: Sports Medicine

## 2023-10-18 DIAGNOSIS — I480 Paroxysmal atrial fibrillation: Secondary | ICD-10-CM | POA: Diagnosis not present

## 2023-10-18 DIAGNOSIS — Z95818 Presence of other cardiac implants and grafts: Secondary | ICD-10-CM | POA: Diagnosis not present

## 2023-10-18 DIAGNOSIS — Z5181 Encounter for therapeutic drug level monitoring: Secondary | ICD-10-CM | POA: Diagnosis not present

## 2023-10-18 DIAGNOSIS — Z79899 Other long term (current) drug therapy: Secondary | ICD-10-CM | POA: Diagnosis not present

## 2023-10-18 DIAGNOSIS — I499 Cardiac arrhythmia, unspecified: Secondary | ICD-10-CM | POA: Diagnosis not present

## 2023-10-19 DIAGNOSIS — I499 Cardiac arrhythmia, unspecified: Secondary | ICD-10-CM | POA: Diagnosis not present

## 2023-11-24 DIAGNOSIS — Z940 Kidney transplant status: Secondary | ICD-10-CM | POA: Diagnosis not present

## 2023-11-24 DIAGNOSIS — M1A9XX0 Chronic gout, unspecified, without tophus (tophi): Secondary | ICD-10-CM | POA: Diagnosis not present

## 2023-12-08 DIAGNOSIS — D849 Immunodeficiency, unspecified: Secondary | ICD-10-CM | POA: Diagnosis not present

## 2023-12-08 DIAGNOSIS — Z5181 Encounter for therapeutic drug level monitoring: Secondary | ICD-10-CM | POA: Diagnosis not present

## 2023-12-08 DIAGNOSIS — Z79899 Other long term (current) drug therapy: Secondary | ICD-10-CM | POA: Diagnosis not present

## 2023-12-08 DIAGNOSIS — Z940 Kidney transplant status: Secondary | ICD-10-CM | POA: Diagnosis not present

## 2023-12-08 DIAGNOSIS — Z79621 Long term (current) use of calcineurin inhibitor: Secondary | ICD-10-CM | POA: Diagnosis not present

## 2023-12-12 DIAGNOSIS — M1A9XX0 Chronic gout, unspecified, without tophus (tophi): Secondary | ICD-10-CM | POA: Diagnosis not present

## 2024-01-03 ENCOUNTER — Ambulatory Visit (INDEPENDENT_AMBULATORY_CARE_PROVIDER_SITE_OTHER): Admitting: Sports Medicine

## 2024-01-03 ENCOUNTER — Other Ambulatory Visit (INDEPENDENT_AMBULATORY_CARE_PROVIDER_SITE_OTHER): Payer: Self-pay

## 2024-01-03 DIAGNOSIS — M1712 Unilateral primary osteoarthritis, left knee: Secondary | ICD-10-CM

## 2024-01-03 MED ORDER — TRIAMCINOLONE ACETONIDE 40 MG/ML IJ SUSP
40.0000 mg | Freq: Once | INTRAMUSCULAR | Status: AC
Start: 2024-01-03 — End: 2024-01-03
  Administered 2024-01-03: 40 mg via INTRAMUSCULAR

## 2024-01-03 NOTE — Assessment & Plan Note (Signed)
 This is a very pleasant 68 year old female, chronic left knee pain, moderate swelling, known arthritis, status post right TKA. We did an injection approximately 4-1/2 months ago and she did well until now, we also obtained an MRI of the left knee that did show osteoarthritis. She did well with the injection before so we will repeat the left knee joint injection today, return to see me as needed.

## 2024-01-03 NOTE — Progress Notes (Signed)
    Procedures performed today:    Procedure: Real-time Ultrasound Guided injection of the left knee Device: Samsung HS60  Verbal informed consent obtained.  Time-out conducted.  Noted no overlying erythema, induration, or other signs of local infection.  Skin prepped in a sterile fashion.  Local anesthesia: Topical Ethyl chloride.  With sterile technique and under real time ultrasound guidance: Mild to moderate effusion noted, 1 cc Kenalog 40, 2 cc lidocaine, 2 cc bupivacaine injected easily Completed without difficulty  Advised to call if fevers/chills, erythema, induration, drainage, or persistent bleeding.  Images permanently stored and available for review in PACS.  Impression: Technically successful ultrasound guided injection.  Independent interpretation of notes and tests performed by another provider:   None.  Brief History, Exam, Impression, and Recommendations:    Primary osteoarthritis of left knee, status post right total knee arthroplasty This is a very pleasant 68 year old female, chronic left knee pain, moderate swelling, known arthritis, status post right TKA. We did an injection approximately 4-1/2 months ago and she did well until now, we also obtained an MRI of the left knee that did show osteoarthritis. She did well with the injection before so we will repeat the left knee joint injection today, return to see me as needed.    ____________________________________________ Ihor Austin. Benjamin Stain, M.D., ABFM., CAQSM., AME. Primary Care and Sports Medicine [facility]  Adjunct Professor of Family Medicine  [employer] [facility] [employer] [facility] [employer]  Restaurant manager, fast food

## 2024-01-06 ENCOUNTER — Ambulatory Visit: Admitting: Physician Assistant

## 2024-01-09 ENCOUNTER — Ambulatory Visit: Admitting: Physician Assistant

## 2024-02-16 DIAGNOSIS — M25562 Pain in left knee: Secondary | ICD-10-CM | POA: Diagnosis not present

## 2024-03-06 DIAGNOSIS — H0015 Chalazion left lower eyelid: Secondary | ICD-10-CM | POA: Diagnosis not present

## 2024-03-16 DIAGNOSIS — L820 Inflamed seborrheic keratosis: Secondary | ICD-10-CM | POA: Diagnosis not present

## 2024-03-16 DIAGNOSIS — Z85828 Personal history of other malignant neoplasm of skin: Secondary | ICD-10-CM | POA: Diagnosis not present

## 2024-03-16 DIAGNOSIS — L570 Actinic keratosis: Secondary | ICD-10-CM | POA: Diagnosis not present

## 2024-03-16 DIAGNOSIS — D849 Immunodeficiency, unspecified: Secondary | ICD-10-CM | POA: Diagnosis not present

## 2024-04-07 ENCOUNTER — Ambulatory Visit
Admission: EM | Admit: 2024-04-07 | Discharge: 2024-04-07 | Disposition: A | Discharge status: Home / Self Care | Attending: Family Medicine | Admitting: Family Medicine

## 2024-04-07 ENCOUNTER — Other Ambulatory Visit: Payer: Self-pay

## 2024-04-07 DIAGNOSIS — M5441 Lumbago with sciatica, right side: Secondary | ICD-10-CM

## 2024-04-07 LAB — POCT URINALYSIS DIP (MANUAL ENTRY)
Bilirubin, UA: NEGATIVE
Blood, UA: NEGATIVE
Glucose, UA: NEGATIVE mg/dL
Ketones, POC UA: NEGATIVE mg/dL
Leukocytes, UA: NEGATIVE
Nitrite, UA: NEGATIVE
Protein Ur, POC: NEGATIVE mg/dL
Spec Grav, UA: 1.01 (ref 1.010–1.025)
Urobilinogen, UA: 0.2 U/dL
pH, UA: 6 (ref 5.0–8.0)

## 2024-04-07 MED ORDER — METHYLPREDNISOLONE ACETATE 80 MG/ML IJ SUSP
80.0000 mg | Freq: Once | INTRAMUSCULAR | Status: AC
  Administered 2024-04-07: 80 mg via INTRAMUSCULAR

## 2024-04-07 MED ORDER — PREDNISONE 10 MG (21) PO TBPK: ORAL_TABLET | Freq: Every day | ORAL | 0 refills | Status: DC

## 2024-04-07 NOTE — ED Triage Notes (Addendum)
 Pt presents to [facility] with co of sciatic nerve pain since last Friday night but she has a history of right kidney transplant in in 2018 so she just wants to make sure no urinary infection is going out.

## 2024-04-07 NOTE — Discharge Instructions (Addendum)
Advised patient to take medications as directed with food to completion.  Encouraged to increase daily water intake to 64 ounces per day while taking this medication.  Advised if symptoms worsen and/or unresolved please follow-up with your PCP or here for further evaluation.

## 2024-04-07 NOTE — ED Provider Notes (Signed)
 [facility]    CSN: [id number] Arrival date & time: 04/07/24  0810      History   Chief Complaint Chief Complaint  Patient presents with   Sciatica    HPI Mariah [name] is a 68 y.o. female.   HPI Pleasant 68 year old female presents with right-sided sciatic nerve pain for 7 days.  Additionally patient reports that she had a right kidney transplant in 2018 and wants to make sure that she does not have a urinary tract infection.  PMH significant for A-fib, HTN, and kidney transplant recipient (2018).  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)    Pneumonia    03/08/17- many years ago   Polycystic kidney disease 11/2016   transplant    PONV (postoperative nausea and vomiting)    Post-transplant diabetes mellitus (HCC)    Pre-diabetes    due to antirejection medication   Ruptured cyst of kidney    SDH (subdural hematoma) (HCC)     Patient Active Problem List   Diagnosis Date Noted   Impingement syndrome of shoulder, left 02/07/2023   ATN (acute tubular necrosis) (HCC) 07/06/2018   Liver cyst 06/15/2018   Presence of Watchman left atrial appendage closure device 12/07/2017   Nephrolithiasis 11/14/2017   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   Hx of motion sickness 10/17/2017   Muscle spasm 10/17/2017   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) 12/16/2016   Status post living-donor kidney transplantation 11/29/2016   Immunosuppression (HCC) 11/29/2016   Adenomatous polyp of colon 05/12/2016   Poor venous access 10/28/2015   Hypertension 06/16/2015   Hyperlipidemia  06/16/2015   Mitral regurgitation 11/20/2014   CKD (chronic kidney disease) stage 4, GFR 15-29 ml/min (HCC) 11/04/2014   Primary osteoarthritis of left knee, status post right total knee arthroplasty 10/10/2014   Iron  deficiency anemia, unspecified 09/25/2014   Tachycardia 09/11/2014   Bilateral leg edema 09/11/2014   Primary gout 09/11/2014   Anxiety 07/11/2014   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   Atrial fibrillation (HCC) 01/19/2013   Kidney disease, chronic, stage III (GFR 30-59 ml/min) (HCC) 11/06/2012   Abnormal levels of other serum enzymes 10/13/2010   Vitamin D deficiency 10/09/2010   Gout 10/09/2010   Polycystic kidney 10/08/2008   HYPERLIPIDEMIA 07/19/2006   Class 2 severe obesity due to excess calories with serious comorbidity and body mass index (BMI) of 35.0 to 35.9 in adult (HCC) 07/19/2006   HYPERTENSION, BENIGN SYSTEMIC 07/19/2006   OSTEOARTHRITIS, MULTI SITES 07/19/2006    Past Surgical History:  Procedure Laterality Date   BASCILIC VEIN TRANSPOSITION Left 09/07/2016   Procedure: BASILIC VEIN TRANSPOSITION LEFT UPPER ARM;  Surgeon: Carlin FORBES Haddock, MD;  Location: [facility] OR;  Service: Vascular;  Laterality: Left;   COLONOSCOPY  03/2004   repeat 10 years   Evacuation of subdural hematoma  11/26/2000   KIDNEY STONE SURGERY     Cysto   KIDNEY TRANSPLANT     Nov 23, 2016   LEFT ATRIAL APPENDAGE OCCLUSION  06/03/2017   Dr. Dasie    LIGATION OF ARTERIOVENOUS  FISTULA Left 03/09/2017   Procedure: LIGATION OF LEFT BASILIC VEIN TRANSPOSITION;  Surgeon: Harvey Carlin BRAVO, MD;  Location[facility] OR;  Service: Vascular;  Laterality: Left;   NEPHRECTOMY  08/03/2021   ORIF ELBOW FRACTURE  10/22/2011   Procedure: OPEN REDUCTION INTERNAL FIXATION (ORIF) ELBOW/OLECRANON FRACTURE;  Surgeon: Donnice DELENA Robinsons, MD;  Location: [facility];  Service: Orthopedics;  Laterality: Left;  open reduction  possible lateral reconstruction with palmaris graft  from left or right side.   RADIAL HEAD IMPLANT  09/24/2011   Procedure: RADIAL HEAD IMPLANT;  Surgeon: Donnice DELENA Robinsons, MD;  Location: [facility];  Service: Orthopedics;  Laterality: Left;  left radial head replacement   REPLACEMENT TOTAL KNEE  12/13/2020   Dr. Liam   TONSILLECTOMY      OB History     Gravida  0   Para  0   Term  0   Preterm  0   AB  0   Living  0      SAB  0   IAB  0   Ectopic  0   Multiple  0   Live Births  0            Home Medications    Prior to Admission medications   Medication Sig Start Date End Date Taking? Authorizing Provider  predniSONE  (STERAPRED UNI-PAK 21 TAB) 10 MG (21) TBPK tablet Take by mouth daily. Take 6 tabs by mouth daily  for 2 days, then 5 tabs for 2 days, then 4 tabs for 2 days, then 3 tabs for 2 days, 2 tabs for 2 days, then 1 tab by mouth daily for 2 days 04/07/24  Yes Teddy Sharper, FNP  acetaminophen  (TYLENOL ) 500 MG tablet Take 1,000 mg by mouth every 6 (six) hours as needed for fever (pain).    [provider]  aspirin  EC 81 MG tablet Take 81 mg by mouth daily.    [provider]  atorvastatin  (LIPITOR ) 40 MG tablet Take 40 mg by mouth every Monday, Wednesday, and Friday.     [provider]  chlorpheniramine-HYDROcodone  (TUSSIONEX) 10-8 MG/5ML Take 5 mLs by mouth every 12 (twelve) hours as needed. 09/14/23   Breeback, [name] L, PA-C  colchicine  0.6 MG tablet Take 0.6 mg by mouth daily.    [provider]  cyclobenzaprine  (FLEXERIL ) 5 MG tablet TAKE 1 TABLET BY MOUTH AT BEDTIME AS NEEDED FOR MUSCLE SPASM 09/14/23   Breeback, [name] L, PA-C  doxycycline  (VIBRA -TABS) 100 MG tablet Take 1 tablet (100 mg total) by mouth 2 (two) times daily. 09/14/23   Breeback, [name] L, PA-C  famotidine  (PEPCID ) 20 MG tablet daily. 10/16/18   [provider]  fluticasone  (FLONASE ) 50 MCG/ACT nasal spray Place 2 sprays into both nostrils daily. 09/14/23   Breeback, [name] L, PA-C   ipratropium (ATROVENT ) 0.06 % nasal spray Place 2 sprays into both nostrils 4 (four) times daily. 10/22/22   Breeback, [name] L, PA-C  loratadine  (CLARITIN ) 10 MG tablet Take 10 mg by mouth daily.    [provider]  MAGNESIUM  PO Take by mouth.    [provider]  metFORMIN (GLUCOPHAGE) 500 MG tablet Take 500 mg by mouth daily with breakfast.     [provider]  Multiple Vitamin (MULTIVITAMIN ADULT PO) Take by mouth.    [provider]  mycophenolate  (MYFORTIC ) 180 MG EC tablet TAKE 2 TABLETS  BY MOUTH 2 TIMES DAILY. 04/19/17   [provider]  ondansetron  (ZOFRAN -ODT) 8 MG disintegrating tablet Take 1 tablet (8 mg total) by mouth every 8 (eight) hours as needed for nausea. 10/22/22   Breeback, [name] L, PA-C  sotalol  (BETAPACE ) 80 MG tablet Take 80 mg by mouth 2 (two) times daily.  06/05/17 10/22/22  [provider]  sulfamethoxazole -trimethoprim  (BACTRIM ) 400-80 MG tablet Take 1 tablet by mouth. M,W,F    [provider]  tacrolimus  (PROGRAF ) 1 MG capsule Take 5 mg in the AM and 4 mg in the PM by mouth daily 09/20/17   [provider]  venlafaxine  XR (EFFEXOR -XR) 37.5 MG 24 hr capsule TAKE 1 CAPSULE BY MOUTH EVERY OTHER DAY 09/14/23   Antoniette Vermell CROME, PA-C    Family History Family History  Problem Relation Age of Onset   Polycystic kidney disease Father    Heart failure Father    Heart disease Mother        Rheumatic fever    Social History Social History   Tobacco Use   Smoking status: Former    Current packs/day: 0.00    Average packs/day: 2.0 packs/day for 24.0 years (48.0 ttl pk-yrs)    Types: Cigarettes    Start date: 12/12/1972    Quit date: 12/12/1996    Years since quitting: 27.3   Smokeless tobacco: Never  Vaping Use   Vaping status: Never Used  Substance Use Topics   Alcohol use: Yes    Alcohol/week: 2.0 standard drinks of alcohol    Types: 2 Glasses of wine per week    Comment: 1 drink a week   Drug use:  No     Allergies   Codeine, Morphine , Poison ivy extract, and Zovirax [acyclovir sodium]   Review of Systems Review of Systems  Genitourinary:  Positive for frequency.  Musculoskeletal:  Positive for back pain.     Physical Exam Triage Vital Signs ED Triage Vitals [04/07/24 0828]  Encounter Vitals Group     BP 132/70     Girls Systolic BP Percentile      Girls Diastolic BP Percentile      Boys Systolic BP Percentile      Boys Diastolic BP Percentile      Pulse Rate 70     Resp 20     Temp 98.8 F (37.1 C)     Temp src      SpO2 98 %     Weight      Height      Head Circumference      Peak Flow      Pain Score 8     Pain Loc      Pain Education      Exclude from Growth Chart    No data found.  Updated Vital Signs BP 132/70   Pulse 70   Temp 98.8 F (37.1 C)   Resp 20   LMP 07/11/2009 (Approximate)   SpO2 98%    Physical Exam Vitals and nursing note reviewed.  Constitutional:      General: She is not in acute distress.    Appearance: Normal appearance. She is obese. She is not ill-appearing.  HENT:     Head: Normocephalic and atraumatic.     Mouth/Throat:     Mouth: Mucous membranes are moist.     Pharynx: Oropharynx is clear.   Eyes:     Extraocular Movements: Extraocular movements intact.     Conjunctiva/sclera: Conjunctivae normal.  Pupils: Pupils are equal, round, and reactive to light.    Cardiovascular:     Rate and Rhythm: Normal rate and regular rhythm.     Pulses: Normal pulses.     Heart sounds: Normal heart sounds.  Pulmonary:     Effort: Pulmonary effort is normal.     Breath sounds: Normal breath sounds. No wheezing, rhonchi or rales.   Musculoskeletal:        General: Normal range of motion.     Comments: Lumbar spine (inferior aspect): TTP over right-sided paraspinous muscles with patient reporting nervelike tingling pain radiating over right buttocks right hip to right knee   Skin:    General: Skin is warm and dry.    Neurological:     General: No focal deficit present.     Mental Status: She is alert and oriented to person, place, and time. Mental status is at baseline.   Psychiatric:        Mood and Affect: Mood normal.        Behavior: Behavior normal.      UC Treatments / Results  Labs (all labs ordered are listed, but only abnormal results are displayed) Labs Reviewed  POCT URINALYSIS DIP (MANUAL ENTRY)    EKG   Radiology No results found.  Procedures Procedures (including critical care time)  Medications Ordered in UC Medications  methylPREDNISolone  acetate (DEPO-MEDROL ) injection 80 mg (80 mg Intramuscular Given 04/07/24 0915)    Initial Impression / Assessment and Plan / UC Course  I have reviewed the triage vital signs and the nursing notes.  Pertinent labs & imaging results that were available during my care of the patient were reviewed by me and considered in my medical decision making (see chart for details).     MDM: 1.  Acute midline low back pain with right-sided sciatica-IM Depo 80 mg given once in clinic, Rx'd Sterapred Unipak (42 tab 10 mg taper). Advised patient to take medications as directed with food to completion.  Encouraged to increase daily water intake to 64 ounces per day while taking this medication.  Advised if symptoms worsen and/or unresolved please follow-up with your PCP or here for further evaluation.  Patient discharged home, hemodynamically stable. Final Clinical Impressions(s) / UC Diagnoses   Final diagnoses:  Acute midline low back pain with right-sided sciatica     Discharge Instructions      Advised patient to take medications as directed with food to completion.  Encouraged to increase daily water intake to 64 ounces per day while taking this medication.  Advised if symptoms worsen and/or unresolved please follow-up with your PCP or here for further evaluation.     ED Prescriptions     Medication Sig Dispense Auth. Provider    predniSONE  (STERAPRED UNI-PAK 21 TAB) 10 MG (21) TBPK tablet Take by mouth daily. Take 6 tabs by mouth daily  for 2 days, then 5 tabs for 2 days, then 4 tabs for 2 days, then 3 tabs for 2 days, 2 tabs for 2 days, then 1 tab by mouth daily for 2 days 42 tablet Teddy Sharper, FNP      PDMP not reviewed this encounter.   Teddy Sharper, FNP 04/07/24 [phone]

## 2024-04-09 DIAGNOSIS — D849 Immunodeficiency, unspecified: Secondary | ICD-10-CM | POA: Diagnosis not present

## 2024-04-09 DIAGNOSIS — Z940 Kidney transplant status: Secondary | ICD-10-CM | POA: Diagnosis not present

## 2024-04-10 ENCOUNTER — Encounter: Payer: Self-pay | Admitting: Physician Assistant

## 2024-04-10 DIAGNOSIS — G8929 Other chronic pain: Secondary | ICD-10-CM

## 2024-04-10 DIAGNOSIS — M5431 Sciatica, right side: Secondary | ICD-10-CM

## 2024-04-10 NOTE — Telephone Encounter (Signed)
 Referral sent for PT - patient informed of Dr. Alvan recommendations . Will try the Lidocaine  patches. She states she feels she will need to see someone before Mariah [name] next available appt and before her return. She will decide what to do after using the Lidocaine  patches.

## 2024-04-10 NOTE — Telephone Encounter (Signed)
 Lets definitely go ahead and place a physical therapy referral for her for down the hall for her back and her knees.  They probably will not be able to get her in until next week anyway because of the long holiday so if we go ahead and get working on that I think it would be effective.  For nighttime I would mostly just rely on the Tylenol , cool or hot compresses and lidocaine  patches.  This can also be found over-the-counter if she is needing something more chronically for pain control at nighttime then I would recommend that she schedule an appointment with [name] in the next couple of weeks to discuss.

## 2024-04-10 NOTE — Telephone Encounter (Signed)
 OK to get in with Dr. ONEIDA

## 2024-04-11 NOTE — Telephone Encounter (Signed)
 Patient scheduled with Dr. Curtis tomorrow 04/12/24

## 2024-04-12 ENCOUNTER — Ambulatory Visit

## 2024-04-12 ENCOUNTER — Ambulatory Visit (INDEPENDENT_AMBULATORY_CARE_PROVIDER_SITE_OTHER): Admitting: Sports Medicine

## 2024-04-12 DIAGNOSIS — M5416 Radiculopathy, lumbar region: Secondary | ICD-10-CM | POA: Diagnosis not present

## 2024-04-12 DIAGNOSIS — M5137 Other intervertebral disc degeneration, lumbosacral region with discogenic back pain only: Secondary | ICD-10-CM | POA: Diagnosis not present

## 2024-04-12 MED ORDER — TRAMADOL HCL 50 MG PO TABS: 50.0000 mg | ORAL_TABLET | Freq: Three times a day (TID) | ORAL | 0 refills | Status: DC | PRN

## 2024-04-12 MED ORDER — GABAPENTIN 300 MG PO CAPS: ORAL_CAPSULE | ORAL | 3 refills | Status: DC

## 2024-04-12 NOTE — Progress Notes (Signed)
    Procedures performed today:    None.  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  Brief History, Exam, Impression, and Recommendations:    Right lumbar radiculopathy Pleasant 68 year old female, several weeks increasing pain right sided axial low back with radiation down the back of the leg occasionally to the calf but not to the knee, no bowel or bladder dysfunction, saddle numbness, constitutional symptoms. She went to urgent care, she was given some prednisone  which may have helped a little bit. She is referred to me for further evaluation and definitive treatment. Her exam is normal, she is neurovascularly intact distally. 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 I do suspect she has right sided lumbar radiculitis, she will do aggressive formal physical therapy, we will add tramadol , gabapentin, return to see me in 6 weeks, MR for interventional planning if not better.    ____________________________________________ Debby PARAS. Curtis, M.D., ABFM., CAQSM., AME. Primary Care and Sports Medicine [facility]  Adjunct Professor of [facility]  [employer]  Restaurant manager, fast food

## 2024-04-12 NOTE — Assessment & Plan Note (Signed)
 Pleasant 68 year old female, several weeks increasing pain right sided axial low back with radiation down the back of the leg occasionally to the calf but not to the knee, no bowel or bladder dysfunction, saddle numbness, constitutional symptoms. She went to urgent care, she was given some prednisone  which may have helped a little bit. She is referred to me for further evaluation and definitive treatment. Her exam is normal, she is neurovascularly intact distally. 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 I do suspect she has right sided lumbar radiculitis, she will do aggressive formal physical therapy, we will add tramadol , gabapentin, return to see me in 6 weeks, MR for interventional planning if not better.

## 2024-04-16 ENCOUNTER — Ambulatory Visit: Attending: Family Medicine | Admitting: Rehabilitative and Restorative Service Providers"

## 2024-04-16 ENCOUNTER — Other Ambulatory Visit: Payer: Self-pay

## 2024-04-16 ENCOUNTER — Encounter: Payer: Self-pay | Admitting: Rehabilitative and Restorative Service Providers"

## 2024-04-16 DIAGNOSIS — M6281 Muscle weakness (generalized): Secondary | ICD-10-CM | POA: Diagnosis not present

## 2024-04-16 DIAGNOSIS — G8929 Other chronic pain: Secondary | ICD-10-CM | POA: Insufficient documentation

## 2024-04-16 DIAGNOSIS — M25561 Pain in right knee: Secondary | ICD-10-CM | POA: Insufficient documentation

## 2024-04-16 DIAGNOSIS — R29818 Other symptoms and signs involving the nervous system: Secondary | ICD-10-CM | POA: Diagnosis not present

## 2024-04-16 DIAGNOSIS — M5459 Other low back pain: Secondary | ICD-10-CM | POA: Insufficient documentation

## 2024-04-16 DIAGNOSIS — M5431 Sciatica, right side: Secondary | ICD-10-CM | POA: Insufficient documentation

## 2024-04-16 NOTE — Therapy (Signed)
 OUTPATIENT PHYSICAL THERAPY LOWER EXTREMITY EVALUATION   Patient Name: JADELIN ENG MRN: [id number] DOB:02-Jan-1956, 68 y.o., female Today's Date: 04/16/2024  END OF SESSION:  PT End of Session - 04/16/24 1526     Visit Number 1    Number of Visits 16    Date for PT Re-Evaluation 06/15/24    Authorization Type [employer] Medicare-- submitting authorization    Progress Note Due on Visit 10    PT Start Time 0935    PT Stop Time 1019    PT Time Calculation (min) 44 min    Activity Tolerance Patient tolerated treatment well    Behavior During Therapy WFL for tasks assessed/performed          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)    Pneumonia    03/08/17- many years ago   Polycystic kidney disease 11/2016   transplant    PONV (postoperative nausea and vomiting)    Post-transplant diabetes mellitus (HCC)    Pre-diabetes    due to antirejection medication   Ruptured cyst of kidney    SDH (subdural hematoma) (HCC)    Past Surgical History:  Procedure Laterality Date   BASCILIC VEIN TRANSPOSITION Left 09/07/2016   Procedure: BASILIC VEIN TRANSPOSITION LEFT UPPER ARM;  Surgeon: Carlin FORBES Haddock, MD;  Location: [facility] OR;  Service: Vascular;  Laterality: Left;   COLONOSCOPY  03/2004   repeat 10 years   Evacuation of subdural hematoma  11/26/2000   KIDNEY STONE SURGERY     Cysto   KIDNEY TRANSPLANT     Nov 23, 2016   LEFT ATRIAL APPENDAGE OCCLUSION  06/03/2017   Dr. Dasie    LIGATION OF ARTERIOVENOUS  FISTULA Left 03/09/2017   Procedure: LIGATION OF LEFT BASILIC VEIN TRANSPOSITION;  Surgeon: Haddock Carlin FORBES, MD;  Location: [facility] OR;  Service: Vascular;  Laterality: Left;   NEPHRECTOMY  08/03/2021   ORIF ELBOW FRACTURE  10/22/2011   Procedure: OPEN REDUCTION INTERNAL FIXATION (ORIF) ELBOW/OLECRANON FRACTURE;  Surgeon: Donnice DELENA Robinsons, MD;  Location: [facility];  Service: Orthopedics;  Laterality: Left;  open reduction  possible lateral reconstruction with palmaris graft from left or right side.   RADIAL HEAD IMPLANT  09/24/2011   Procedure: RADIAL HEAD IMPLANT;  Surgeon: Donnice DELENA Robinsons, MD;  Location: [facility];  Service: Orthopedics;  Laterality: Left;  left radial head replacement   REPLACEMENT TOTAL KNEE  12/13/2020   Dr. 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) 12/16/2016   Status post living-donor kidney transplantation  11/29/2016   Immunosuppression (HCC) 11/29/2016   Adenomatous polyp of colon 05/12/2016   Poor venous access 10/28/2015   Hypertension 06/16/2015   Hyperlipidemia 06/16/2015   Mitral regurgitation 11/20/2014   CKD (chronic kidney disease) stage 4, GFR 15-29 ml/min (HCC) 11/04/2014   Primary osteoarthritis of left knee, status post right total knee arthroplasty 10/10/2014   Iron  deficiency anemia, unspecified 09/25/2014   Tachycardia 09/11/2014   Bilateral leg edema 09/11/2014   Primary gout 09/11/2014   Anxiety 07/11/2014   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   Atrial fibrillation (HCC) 01/19/2013   Kidney disease, chronic, stage III (GFR 30-59  ml/min) (HCC) 11/06/2012   Abnormal levels of other serum enzymes 10/13/2010   Vitamin D deficiency 10/09/2010   Gout 10/09/2010   Polycystic kidney 10/08/2008   HYPERLIPIDEMIA 07/19/2006   Class 2 severe obesity due to excess calories with serious comorbidity and body mass index (BMI) of 35.0 to 35.9 in adult (HCC) 07/19/2006   HYPERTENSION, BENIGN SYSTEMIC 07/19/2006   OSTEOARTHRITIS, MULTI SITES 07/19/2006    PCP: Vermell Bologna, PA REFERRING PROVIDER: Alvan Craven, MD REFERRING DIAG:  [phone] (ICD-10-CM) - Chronic pain of right knee  M54.31 (ICD-10-CM) - Sciatica of right side   THERAPY DIAG:  Other low back pain  Muscle weakness (generalized)  Other symptoms and signs involving the nervous system  Rationale for Evaluation and Treatment: Rehabilitation  ONSET DATE: 04/10/2024  SUBJECTIVE:  SUBJECTIVE STATEMENT: The patient reports sudden onset of back pain 3 weeks ago after attempting to hook a siphon up to her hose and having to stay in a flexed position for an extended period. When she came back inside, pain was present. Meds are helping with nighttime sleep (prior to that it was keeping her up all night). She has an antalgic pattern with gait. She is having radiating pain down into the R calf muscle and had foot cramps yesterday.  She has weakness in R leg like her knee could give out. Patient goes to silver sneakers class 1x/week and works with Theatre stage manager at [employer].  PERTINENT HISTORY: H/o SDH, HTN, hyperlipidemia, a-fib, tachycardia, kidney transplant recipient PAIN:  Are you having pain? Yes: NPRS scale: 10/10 at worst, takes meds at night is now able to sleep Pain location: low back radiating into R leg Pain description: acute, severe pain Aggravating factors: can't lay flat (side sleeping), worse at night, bending forward Relieving factors: rest, meds at night  PRECAUTIONS: None  RED FLAGS: Denies bowel/bladder issues, saddle  paresthesia  WEIGHT BEARING RESTRICTIONS: No  FALLS:  Has patient fallen in last 6 months? 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  DIAGNOSTIC FINDINGS: CT scan from 2016 that did show DDD L4-5 and L5-S1 with vacuum disc phenomenon L5-S1.  PATIENT SURVEYS:  Modified Oswestry:  42%  COGNITION: Overall cognitive status: Within functional limits for tasks assessed     SENSATION: Right knee has numbness  MUSCLE LENGTH: Hamstrings: Right -20 deg; Left -20 deg Quad tightness bilatrally Gastroc tightness bilaterally  POSTURE: No Significant postural limitations  PALPATION: Tender to touch over L and R PSIS, tender to touch glut med on the L side *significant tenderness to palpation over the R knee anterior medial region (  pes anserine) that remains tender after palpation  LUMBAR ROM: pain is 2-3/10 prior to testing (goes up to 6/10 with AROM) Active  A/PROM  eval  Flexion 50%  Limited 7/10  Extension 50% limited  Right lateral flexion 50% limited  Left lateral flexion 50% limited  Right rotation 50% limited  Left rotation 50% limited   (Blank rows = not tested)  LOWER EXTREMITY MMT: MMT Right eval Left eval  Hip flexion 3+3+/5 4/5  3+/5  4/5 L  Hip extension    Hip abduction    Hip adduction    Hip internal rotation    Hip external rotation    Knee flexion 5/5 5/5  Knee extension 5/5 5/5  Ankle dorsiflexion 3+/5 4/5  Ankle plantarflexion    Ankle inversion    Ankle eversion     (Blank rows = not tested)  GAIT: Distance walked: 70 ft Assistive device utilized: None Level of assistance: Complete Independence Comments: antalgic gait  pattern R LE                                                                                                                               [facility] Adult PT Treatment:                                                DATE: 04/16/2024 Therapeutic Exercise: Supine Bridging x 10 reps Marching x 10 reps with TA activation Hamstring stretch Prone  On elbows for extension  PATIENT EDUCATION:  Education details: HEP Person educated: Patient Education method: Programmer, multimedia, Facilities manager, and Handouts Education comprehension: verbalized understanding, returned demonstration, and needs further education  HOME EXERCISE PROGRAM: Access Code: [email or web address] URL: [email or web address] Date: 04/16/2024 Prepared by: Tawni Ferrier  Exercises - Supine Bridge  - 1 x daily - 5 x weekly - 2 sets - 10 reps - Supine March  - 1 x daily - 5 x weekly - 2 sets - 10 reps - Hooklying Hamstring Stretch with Strap  - 1 x daily - 5 x weekly - 1 sets - 2 reps - 30 seconds hold - Prone on Elbows Stretch  - 2 x daily - 5 x weekly - 1 sets - 1 reps - 2 minutes hold  ASSESSMENT:  CLINICAL IMPRESSION: Patient is a 68 y.o. female who was seen today for physical therapy evaluation and treatment for R radicular pain.  Pain radiates into R anterior thigh and lateral thigh. Knee tender to palpation anterior/medial. She presents with impairments in muscle strength of bilateral hips, ankle DF, lumbar ROM, tenderness to palpation of her sacrum and L glut med, and pain that is radiating in nature. She is sleeping better with recent meds, but continues with disruption to ADLs and IADLs.   OBJECTIVE IMPAIRMENTS: Abnormal gait, decreased activity tolerance, decreased mobility, decreased ROM, decreased  strength, hypomobility, increased fascial restrictions, impaired flexibility, impaired sensation, and pain.   ACTIVITY LIMITATIONS: carrying, lifting, bending, standing, squatting, sleeping, stairs, and locomotion  level  PARTICIPATION LIMITATIONS: meal prep, cleaning, driving, community activity, and yard work  PERSONAL FACTORS: 3+ comorbidities: see PMH are also affecting patient's functional outcome.   REHAB POTENTIAL: Good  CLINICAL DECISION MAKING: Evolving/moderate complexity  EVALUATION COMPLEXITY: Moderate   GOALS: Goals reviewed with patient? Yes  SHORT TERM GOALS: Target date: 05/16/24  The patient will be indep with initial HEP Baseline: initiated at eval Goal status: INITIAL  2.  The patient will improve modified oswestry by 8% to demonstrate improved functional abilities. Baseline:  42% Goal status: INITIAL  3.   The patient will report pain 7/10 at worst.  Baseline:  10/10 at worst Goal status: INITIAL  4.  The patient will report less radiating pain into R leg to centralize lumbar pain. Baseline:  Radiates into R lateral thigh along L4 dermatomal pattern Goal status: INITIAL  LONG TERM GOALS: Target date: 06/16/24  The patient will be indep with progression of HEP. Baseline:  initiated at eval Goal status: INITIAL  2.  The patient will improve modified oswestry by 15% to demonstrate improved functional abilities. Baseline: 42% Goal status: INITIAL  3.  The patient will improve lumbar ROM reporting no pain with lumbar flexion/extension. Baseline:  increases pain to 6/10 Goal status: INITIAL  4.  The patient will improve LE MMT strength to 5/5 bilaterally to demo improved strength for IADLs. Baseline:  see chart Goal status: INITIAL  5.  The patient will return to prior gym routine. Baseline:  Is not able to tolerate same intensity Goal status: INITIAL  PLAN:  PT FREQUENCY: 2x/week  PT DURATION: 8 weeks  PLANNED INTERVENTIONS: [id number]- PT Re-evaluation, 97750- Physical Performance Testing, 97110-Therapeutic exercises, 97530- Therapeutic activity, [id number]- Neuromuscular re-education, 97535- Self Care, [id number]- Manual therapy, [phone]- Gait training, [phone]- Aquatic  Therapy, [phone]- Electrical stimulation (unattended), [phone]- Traction (mechanical), 20560 (1-2 muscles), [id number] (3+ muscles)- Dry Needling, Patient/Family education, Stair training, Taping, Joint mobilization, Spinal mobilization, Cryotherapy, and Moist heat  PLAN FOR NEXT SESSION: Further assess pain on R anterior knee-- (was tender to palpation patellar tendon), consider lumbar traction due to radicular symptoms. Continue with progression of extension based exercises if tolerates HEP well, LE stretching (HS, quads, gastroc all need ).    Semiyah Newgent, PT 04/16/2024, 3:30 PM

## 2024-04-18 DIAGNOSIS — Z940 Kidney transplant status: Secondary | ICD-10-CM | POA: Diagnosis not present

## 2024-04-18 DIAGNOSIS — D849 Immunodeficiency, unspecified: Secondary | ICD-10-CM | POA: Diagnosis not present

## 2024-04-19 ENCOUNTER — Encounter: Payer: Self-pay | Admitting: Rehabilitative and Restorative Service Providers"

## 2024-04-19 ENCOUNTER — Ambulatory Visit: Admitting: Rehabilitative and Restorative Service Providers"

## 2024-04-19 DIAGNOSIS — R29818 Other symptoms and signs involving the nervous system: Secondary | ICD-10-CM | POA: Diagnosis not present

## 2024-04-19 DIAGNOSIS — M5431 Sciatica, right side: Secondary | ICD-10-CM | POA: Diagnosis not present

## 2024-04-19 DIAGNOSIS — G8929 Other chronic pain: Secondary | ICD-10-CM | POA: Diagnosis not present

## 2024-04-19 DIAGNOSIS — M6281 Muscle weakness (generalized): Secondary | ICD-10-CM

## 2024-04-19 DIAGNOSIS — M5459 Other low back pain: Secondary | ICD-10-CM | POA: Diagnosis not present

## 2024-04-19 DIAGNOSIS — M25561 Pain in right knee: Secondary | ICD-10-CM | POA: Diagnosis not present

## 2024-04-19 NOTE — Therapy (Signed)
 OUTPATIENT PHYSICAL THERAPY LOWER EXTREMITY TREATMENT   Patient Name: Mariah [name] MRN: [id number] DOB:Feb 17, 1956, 68 y.o., female Today's Date: 04/19/2024  END OF SESSION:  PT End of Session - 04/19/24 1314     Visit Number 2    Number of Visits 16    Date for PT Re-Evaluation 06/15/24    Authorization Type [employer] Medicare-- submitting authorization    Progress Note Due on Visit 10    PT Start Time 1314    PT Stop Time 1400    PT Time Calculation (min) 46 min    Activity Tolerance Patient tolerated treatment well          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)    Pneumonia    03/08/17- many years ago   Polycystic kidney disease 11/2016   transplant    PONV (postoperative nausea and vomiting)    Post-transplant diabetes mellitus (HCC)    Pre-diabetes    due to antirejection medication   Ruptured cyst of kidney    SDH (subdural hematoma) (HCC)    Past Surgical History:  Procedure Laterality Date   BASCILIC VEIN TRANSPOSITION Left 09/07/2016   Procedure: BASILIC VEIN TRANSPOSITION LEFT UPPER ARM;  Surgeon: Carlin FORBES Haddock, MD;  Location: [facility] OR;  Service: Vascular;  Laterality: Left;   COLONOSCOPY  03/2004   repeat 10 years   Evacuation of subdural hematoma  11/26/2000   KIDNEY STONE SURGERY     Cysto   KIDNEY TRANSPLANT     Nov 23, 2016   LEFT ATRIAL APPENDAGE OCCLUSION  06/03/2017   Dr. Dasie    LIGATION OF ARTERIOVENOUS  FISTULA Left 03/09/2017   Procedure: LIGATION OF LEFT BASILIC VEIN TRANSPOSITION;  Surgeon: Haddock Carlin FORBES, MD;  Location: [facility] OR;  Service: Vascular;  Laterality:  Left;   NEPHRECTOMY  08/03/2021   ORIF ELBOW FRACTURE  10/22/2011   Procedure: OPEN REDUCTION INTERNAL FIXATION (ORIF) ELBOW/OLECRANON FRACTURE;  Surgeon: Donnice DELENA Robinsons, MD;  Location: [facility];  Service: Orthopedics;  Laterality: Left;  open reduction  possible lateral reconstruction with palmaris graft from left or right side.   RADIAL HEAD IMPLANT  09/24/2011   Procedure: RADIAL HEAD IMPLANT;  Surgeon: Donnice DELENA Robinsons, MD;  Location: [facility];  Service: Orthopedics;  Laterality: Left;  left radial head replacement   REPLACEMENT TOTAL KNEE  12/13/2020   Dr. 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) 12/16/2016   Status post living-donor kidney transplantation 11/29/2016   Immunosuppression (HCC) 11/29/2016   Adenomatous polyp  of colon 05/12/2016   Poor venous access 10/28/2015   Hypertension 06/16/2015   Hyperlipidemia 06/16/2015   Mitral regurgitation 11/20/2014   CKD (chronic kidney disease) stage 4, GFR 15-29 ml/min (HCC) 11/04/2014   Primary osteoarthritis of left knee, status post right total knee arthroplasty 10/10/2014   Iron  deficiency anemia, unspecified 09/25/2014   Tachycardia 09/11/2014   Bilateral leg edema 09/11/2014   Primary gout 09/11/2014   Anxiety 07/11/2014   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   Atrial fibrillation (HCC) 01/19/2013   Kidney disease, chronic, stage III (GFR 30-59 ml/min) (HCC) 11/06/2012   Abnormal levels of other serum enzymes  10/13/2010   Vitamin D deficiency 10/09/2010   Gout 10/09/2010   Polycystic kidney 10/08/2008   HYPERLIPIDEMIA 07/19/2006   Class 2 severe obesity due to excess calories with serious comorbidity and body mass index (BMI) of 35.0 to 35.9 in adult (HCC) 07/19/2006   HYPERTENSION, BENIGN SYSTEMIC 07/19/2006   OSTEOARTHRITIS, MULTI SITES 07/19/2006    PCP: Vermell Bologna, PA REFERRING PROVIDER: Alvan Craven, MD REFERRING DIAG:  [phone] (ICD-10-CM) - Chronic pain of right knee  M54.31 (ICD-10-CM) - Sciatica of right side   THERAPY DIAG:  Other low back pain  Muscle weakness (generalized)  Other symptoms and signs involving the nervous system  Rationale for Evaluation and Treatment: Rehabilitation  ONSET DATE: 04/10/2024  SUBJECTIVE:  SUBJECTIVE STATEMENT: Patient reports that she feels stiff and tight today. Pain level was 6-7/10 before gym and HEP and the 4-5/10 after exercises. Sleeping better with medications.    EVAL:The patient reports sudden onset of back pain 3 weeks ago after attempting to hook a siphon up to her hose and having to stay in a flexed position for an extended period. When she came back inside, pain was present. Meds are helping with nighttime sleep (prior to that it was keeping her up all night). She has an antalgic pattern with gait. She is having radiating pain down into the R calf muscle and had foot cramps yesterday.  She has weakness in R leg like her knee could give out. Patient goes to silver sneakers class 1x/week and works with Theatre stage manager at [employer].  PERTINENT HISTORY: H/o SDH, HTN, hyperlipidemia, a-fib, tachycardia, kidney transplant recipient PAIN:  Are you having pain? Yes: NPRS scale: 4-5/10 at worst, takes meds at night is now able to sleep Pain location: low back radiating into R leg Pain description: acute, severe pain Aggravating factors: can't lay flat (side sleeping), worse at night, bending  forward Relieving factors: rest, meds at night  PRECAUTIONS: None  RED FLAGS: Denies bowel/bladder issues, saddle paresthesia  WEIGHT BEARING RESTRICTIONS: No  FALLS:  Has patient fallen in last 6 months? 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  DIAGNOSTIC FINDINGS: CT scan from 2016 that did show DDD L4-5 and L5-S1 with vacuum disc phenomenon L5-S1.  PATIENT SURVEYS:  Modified Oswestry:  42%   SENSATION: Right knee has numbness  MUSCLE LENGTH: Hamstrings: Right -20 deg; Left -20 deg Quad tightness bilatrally Gastroc tightness bilaterally  POSTURE: No Significant postural limitations  PALPATION: Tender to touch over L and R PSIS, tender to touch glut med on the L side *significant tenderness to palpation over the R  knee anterior medial region (pes anserine) that remains tender after palpation  LUMBAR ROM: pain is 2-3/10 prior to testing (goes up to 6/10 with AROM) Active  A/PROM  eval  Flexion 50%  Limited 7/10  Extension 50% limited  Right lateral flexion 50% limited  Left lateral flexion 50% limited  Right rotation 50% limited  Left rotation 50% limited   (Blank rows = not tested)  LOWER EXTREMITY MMT: MMT Right eval Left eval  Hip flexion 3+3+/5 4/5  3+/5  4/5 L  Hip extension    Hip abduction    Hip adduction    Hip internal rotation    Hip external rotation    Knee flexion 5/5 5/5  Knee extension 5/5 5/5  Ankle dorsiflexion 3+/5 4/5  Ankle plantarflexion    Ankle inversion    Ankle eversion     (Blank rows = not tested)  GAIT: Distance walked: 70 ft Assistive device utilized: None Level of assistance: Complete Independence Comments:  antalgic gait pattern R LE                                                                                                                               [facility] Adult PT Treatment:                                                DATE: 04/19/24 Therapeutic Exercise: Prone prop x 3 Prone press up 2 sec x 10 (repeated after hamstring stretch increased radicular pain) Hamstring stretch with strap 30 sec x 3  Piriformis stretch supine travell 30 sec x 3 with strap and PT assist  Hamstring stretch sitting hinged hip 30 sec x 3 Rt (better tolerated) Manual Therapy: STM Rt posterior hip  IR/ER Rt hip patient prone knee flexed  Passive Rt knee flexion 20 sec x 3  Neuromuscular re-ed: Attempt to engage core - difficulty with TA activation likely due to multiple surgeries  Therapeutic Activity: Standing trunk extension 2-3 sec x 3   Self Care: Myofacial ball released standing posterior hip  Sitting posture and alignment - suggestions for modifying chair  Back education re transfers and transitional movements - needs continued education    [facility] Adult PT Treatment:                                                DATE: 04/16/2024 Therapeutic Exercise: Supine Bridging x 10 reps Marching x 10 reps with TA activation Hamstring stretch Prone  On elbows for extension  PATIENT EDUCATION:  Education details: HEP Person educated: Patient Education method: Programmer, multimedia, Facilities manager, and Handouts Education comprehension: verbalized understanding, returned demonstration, and needs further education  HOME EXERCISE PROGRAM: Access  Code: [email or web address] URL: [email or web address] Date: 04/19/2024 Prepared by: Ciaira Natividad  Exercises - Supine Bridge  - 1 x daily - 5 x weekly - 2 sets - 10 reps - Supine March  - 1 x daily - 5 x weekly - 2 sets - 10 reps - Hooklying Hamstring Stretch with Strap  - 1 x daily - 5 x weekly - 1 sets - 2 reps - 30 seconds hold - Prone on Elbows Stretch  - 2 x daily - 5 x  weekly - 1 sets - 1 reps - 2 minutes hold - Prone Press Up  - 2 x daily - 7 x weekly - 1 sets - 10 reps - 2-3 sec  hold - Supine Piriformis Stretch with Leg Straight  - 1 x daily - 7 x weekly - 1 sets - 3 reps - 30 sec  hold - Seated Hamstring Stretch  - 1-2 x daily - 7 x weekly - 1 sets - 3 reps - 30 sec  hold - Standing Lumbar Extension  - 2 x daily - 7 x weekly - 1 sets - 2-3 reps - 2-3 sec  hold - Standing Piriformis Release with Ball at Wall  - 2 x daily - 7 x weekly - 30-60 sec  hold  ASSESSMENT:  CLINICAL IMPRESSION: Patient returned with some improvement in LBP and R LE radicular pain. She has responded well to extension program which was progressed to include prone press up and standing trunk extension to patient tolerance. She has difficulty with core activation. Progressed with stretching and strengthening. Trial of manual work posterior R hip with noted tightness in the gluts and piriformis. She has tightness in R > L knee flexion in prone. Tolerated hamstring stretch in sitting better than supine.    EVAL: Patient is a 68 y.o. female who was seen today for physical therapy evaluation and treatment for R radicular pain.  Pain radiates into R anterior thigh and lateral thigh. Knee tender to palpation anterior/medial. She presents with impairments in muscle strength of bilateral hips, ankle DF, lumbar ROM, tenderness to palpation of her sacrum and L glut med, and pain that is radiating in nature. She is sleeping better with recent meds, but continues with disruption to ADLs and IADLs.   OBJECTIVE IMPAIRMENTS: Abnormal gait, decreased activity tolerance, decreased mobility, decreased ROM, decreased strength, hypomobility, increased fascial restrictions, impaired flexibility, impaired sensation, and pain.    GOALS: Goals reviewed with patient? Yes  SHORT TERM GOALS: Target date: 05/16/24  The patient will be indep with initial HEP Baseline: initiated at eval Goal status: INITIAL  2.   The patient will improve modified oswestry by 8% to demonstrate improved functional abilities. Baseline:  42% Goal status: INITIAL  3.   The patient will report pain 7/10 at worst.  Baseline:  10/10 at worst Goal status: INITIAL  4.  The patient will report less radiating pain into R leg to centralize lumbar pain. Baseline:  Radiates into R lateral thigh along L4 dermatomal pattern Goal status: INITIAL  LONG TERM GOALS: Target date: 06/16/24  The patient will be indep with progression of HEP. Baseline:  initiated at eval Goal status: INITIAL  2.  The patient will improve modified oswestry by 15% to demonstrate improved functional abilities. Baseline: 42% Goal status: INITIAL  3.  The patient will improve lumbar ROM reporting no pain with lumbar flexion/extension. Baseline:  increases pain to 6/10 Goal status: INITIAL  4.  The patient will improve LE MMT strength  to 5/5 bilaterally to demo improved strength for IADLs. Baseline:  see chart Goal status: INITIAL  5.  The patient will return to prior gym routine. Baseline:  Is not able to tolerate same intensity Goal status: INITIAL  PLAN:  PT FREQUENCY: 2x/week  PT DURATION: 8 weeks  PLANNED INTERVENTIONS: [id number]- PT Re-evaluation, 97750- Physical Performance Testing, 97110-Therapeutic exercises, 97530- Therapeutic activity, [id number]- Neuromuscular re-education, 97535- Self Care, [id number]- Manual therapy, [phone]- Gait training, [phone]- Aquatic Therapy, [phone]- Electrical stimulation (unattended), [phone]- Traction (mechanical), 20560 (1-2 muscles), [id number] (3+ muscles)- Dry Needling, Patient/Family education, Stair training, Taping, Joint mobilization, Spinal mobilization, Cryotherapy, and Moist heat  PLAN FOR NEXT SESSION: Further assess pain on R anterior knee-- (was tender to palpation patellar tendon), consider lumbar traction due to radicular symptoms. Continue with progression of extension based exercises if tolerates HEP well, LE  stretching (HS, quads, gastroc all need ). Try sciatic nerve glide supine   Kwinton Maahs P Brigett Estell, PT 04/19/2024, 1:15 PM

## 2024-04-23 NOTE — Therapy (Signed)
 OUTPATIENT PHYSICAL THERAPY LOWER EXTREMITY TREATMENT   Patient Name: Mariah [name] MRN: [id number] DOB:07-11-1956, 68 y.o., female Today's Date: 04/24/2024  END OF SESSION:  PT End of Session - 04/24/24 1314     Visit Number 3    Number of Visits 16    Date for PT Re-Evaluation 06/15/24    Authorization Type [employer] Medicare-- submitting authorization    Progress Note Due on Visit 10    PT Start Time 1315    PT Stop Time 1402    PT Time Calculation (min) 47 min    Activity Tolerance Patient tolerated treatment well    Behavior During Therapy WFL for tasks assessed/performed           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)    Pneumonia    03/08/17- many years ago   Polycystic kidney disease 11/2016   transplant    PONV (postoperative nausea and vomiting)    Post-transplant diabetes mellitus (HCC)    Pre-diabetes    due to antirejection medication   Ruptured cyst of kidney    SDH (subdural hematoma) (HCC)    Past Surgical History:  Procedure Laterality Date   BASCILIC VEIN TRANSPOSITION Left 09/07/2016   Procedure: BASILIC VEIN TRANSPOSITION LEFT UPPER ARM;  Surgeon: Carlin FORBES Haddock, MD;  Location: [facility] OR;  Service: Vascular;  Laterality: Left;   COLONOSCOPY  03/2004   repeat 10 years   Evacuation of subdural hematoma  11/26/2000   KIDNEY STONE SURGERY     Cysto   KIDNEY TRANSPLANT     Nov 23, 2016   LEFT ATRIAL APPENDAGE OCCLUSION  06/03/2017   Dr. Dasie    LIGATION OF ARTERIOVENOUS  FISTULA Left 03/09/2017   Procedure: LIGATION OF LEFT BASILIC VEIN TRANSPOSITION;  Surgeon: Haddock Carlin FORBES, MD;  Location: [facility] OR;  Service: Vascular;  Laterality: Left;   NEPHRECTOMY  08/03/2021   ORIF ELBOW FRACTURE  10/22/2011   Procedure: OPEN REDUCTION INTERNAL FIXATION (ORIF) ELBOW/OLECRANON FRACTURE;  Surgeon: Donnice DELENA Robinsons, MD;  Location: [facility];  Service: Orthopedics;  Laterality: Left;  open reduction  possible lateral reconstruction with palmaris graft from left or right side.   RADIAL HEAD IMPLANT  09/24/2011   Procedure: RADIAL HEAD IMPLANT;  Surgeon: Donnice DELENA Robinsons, MD;  Location:  SURGERY CENTER;  Service: Orthopedics;  Laterality: Left;  left radial head replacement   REPLACEMENT TOTAL KNEE  12/13/2020   Dr. 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) 12/16/2016   Status post living-donor kidney  transplantation 11/29/2016   Immunosuppression (HCC) 11/29/2016   Adenomatous polyp of colon 05/12/2016   Poor venous access 10/28/2015   Hypertension 06/16/2015   Hyperlipidemia 06/16/2015   Mitral regurgitation 11/20/2014   CKD (chronic kidney disease) stage 4, GFR 15-29 ml/min (HCC) 11/04/2014   Primary osteoarthritis of left knee, status post right total knee arthroplasty 10/10/2014   Iron  deficiency anemia, unspecified 09/25/2014   Tachycardia 09/11/2014   Bilateral leg edema 09/11/2014   Primary gout 09/11/2014   Anxiety 07/11/2014   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   Atrial fibrillation (HCC) 01/19/2013   Kidney disease, chronic, stage III (GFR 30-59  ml/min) (HCC) 11/06/2012   Abnormal levels of other serum enzymes 10/13/2010   Vitamin D deficiency 10/09/2010   Gout 10/09/2010   Polycystic kidney 10/08/2008   HYPERLIPIDEMIA 07/19/2006   Class 2 severe obesity due to excess calories with serious comorbidity and body mass index (BMI) of 35.0 to 35.9 in adult (HCC) 07/19/2006   HYPERTENSION, BENIGN SYSTEMIC 07/19/2006   OSTEOARTHRITIS, MULTI SITES 07/19/2006    PCP: Vermell Bologna, PA REFERRING PROVIDER: Alvan Craven, MD REFERRING DIAG:  [phone] (ICD-10-CM) - Chronic pain of right knee  M54.31 (ICD-10-CM) - Sciatica of right side   THERAPY DIAG:  Other low back pain  Muscle weakness (generalized)  Other symptoms and signs involving the nervous system  Rationale for Evaluation and Treatment: Rehabilitation  ONSET DATE: 04/10/2024  SUBJECTIVE:  SUBJECTIVE STATEMENT: Tripped twice today on R foot. Did not fall. Back hurt yesterday but not this morning. More in the leg to knee.    EVAL:The patient reports sudden onset of back pain 3 weeks ago after attempting to hook a siphon up to her hose and having to stay in a flexed position for an extended period. When she came back inside, pain was present. Meds are helping with nighttime sleep (prior to that it was keeping her up all night). She has an antalgic pattern with gait. She is having radiating pain down into the R calf muscle and had foot cramps yesterday.  She has weakness in R leg like her knee could give out. Patient goes to silver sneakers class 1x/week and works with Theatre stage manager at [employer].  PERTINENT HISTORY: H/o SDH, HTN, hyperlipidemia, a-fib, tachycardia, kidney transplant recipient PAIN:  Are you having pain? Yes: NPRS scale: 4-5/10 at worst, takes meds at night is now able to sleep Pain location: low back radiating into R leg Pain description: acute, severe pain Aggravating factors: can't lay flat (side sleeping), worse at night,  bending forward Relieving factors: rest, meds at night  PRECAUTIONS: None  RED FLAGS: Denies bowel/bladder issues, saddle paresthesia  WEIGHT BEARING RESTRICTIONS: No  FALLS:  Has patient fallen in last 6 months? 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  DIAGNOSTIC FINDINGS: CT scan from 2016 that did show DDD L4-5 and L5-S1 with vacuum disc phenomenon L5-S1.  PATIENT SURVEYS:  Modified Oswestry:  42%   SENSATION: Right knee has numbness  MUSCLE LENGTH: Hamstrings: Right -20 deg; Left -20 deg Quad tightness bilatrally Gastroc tightness bilaterally  POSTURE: No Significant postural limitations  PALPATION: Tender to touch over L and R PSIS, tender to touch glut med on the L side *  significant tenderness to palpation over the R knee anterior medial region (pes anserine) that remains tender after palpation  LUMBAR ROM: pain is 2-3/10 prior to testing (goes up to 6/10 with AROM) Active  A/PROM  eval  Flexion 50%  Limited 7/10  Extension 50% limited  Right lateral flexion 50% limited  Left lateral flexion 50% limited  Right rotation 50% limited  Left rotation 50% limited   (Blank rows = not tested)  LOWER EXTREMITY MMT: MMT Right eval Left eval  Hip flexion 3+3+/5 4/5  3+/5  4/5 L  Hip extension    Hip abduction    Hip adduction    Hip internal rotation    Hip external rotation    Knee flexion 5/5 5/5  Knee extension 5/5 5/5  Ankle dorsiflexion 3+/5 4/5  Ankle plantarflexion    Ankle inversion    Ankle eversion     (Blank rows = not tested)  GAIT: Distance walked: 70 ft Assistive device utilized: None Level of assistance: Complete  Independence Comments: antalgic gait pattern R LE  [facility] Adult PT Treatment:                                                DATE: 04/24/24 Therapeutic Exercise: Prone lying x 3 min decreases pain to 3/10 in thigh Prone press up 2 sec x 10 abolishes pain - return of sx upon standing and walking one lap Manual traction with belt, feet on orange plyo ball - pt reports relief Modalities: Lumbar mechanical traction 60#/ 20# 60 sec on/20 sec off x 10 min Neuromuscular re-ed: Prone hip ext x 10 with pelvic press Plank elbows and knees 5 sec hold x 5 sec Self Care: Avoidance of flexion activities  ADL/body Curator h/o - went over partially including hinged back versus rounded back for bending.                                                                                                                       [facility] Adult PT Treatment:                                                DATE: 04/19/24 Therapeutic Exercise: Prone prop x 3 Prone press up 2 sec x 10 (repeated after hamstring stretch increased radicular pain) Hamstring stretch with strap 30 sec x 3  Piriformis stretch supine travell 30 sec x 3 with strap and PT assist  Hamstring stretch sitting hinged hip 30 sec x 3 Rt (better tolerated) Manual Therapy: STM Rt posterior hip  IR/ER Rt hip patient prone knee flexed  Passive Rt knee flexion 20 sec x 3  Neuromuscular re-ed: Attempt to engage core - difficulty with TA activation likely due  to multiple surgeries  Therapeutic Activity: Standing trunk extension 2-3 sec x 3   Self Care: Myofacial ball released standing posterior hip  Sitting posture and alignment - suggestions for modifying chair  Back education re transfers and transitional movements - needs continued education    [facility] Adult PT Treatment:                                                DATE: 04/16/2024 Therapeutic Exercise: Supine Bridging x 10 reps Marching x 10 reps with TA activation Hamstring stretch Prone  On  elbows for extension  PATIENT EDUCATION:  Education details: HEP Person educated: Patient Education method: Programmer, multimedia, Facilities manager, and Handouts Education comprehension: verbalized understanding, returned demonstration, and needs further education  HOME EXERCISE PROGRAM: Access Code: [email or web address] URL: [email or web address] Date: 04/24/2024 Prepared by: Mliss  Exercises - Supine Bridge  - 1 x daily - 5 x weekly - 2 sets - 10 reps - Supine March  - 1 x daily - 5 x weekly - 2 sets - 10 reps - Hooklying Hamstring Stretch with Strap  - 1 x daily - 5 x weekly - 1 sets - 2 reps - 30 seconds hold - Prone on Elbows Stretch  - 2 x daily - 5 x weekly - 1 sets - 1 reps - 2 minutes hold - Prone Press Up  - 2 x daily - 7 x weekly - 1 sets - 10 reps - 2-3 sec  hold - Supine Piriformis Stretch with Leg Straight  - 1 x daily - 7 x weekly - 1 sets - 3 reps - 30 sec  hold - Seated Hamstring Stretch  - 1-2 x daily - 7 x weekly - 1 sets - 3 reps - 30 sec  hold - Standing Lumbar Extension  - 2 x daily - 7 x weekly - 1 sets - 2-3 reps - 2-3 sec  hold - Standing Piriformis Release with Ball at Wall  - 2 x daily - 7 x weekly - 30-60 sec  hold  Patient Education - Posture and Body Mechanics  ASSESSMENT:  CLINICAL IMPRESSION:  Patient able to abolish sx with prone extension. Symptoms were also abolished with manual traction, so trial of mechanical traction completed at 60# with good response. Pt educated to expect soreness in the back after traction. Provided ADL/posture handout and reviewed first half. She will benefit from further review and working to utilize hinged posturing and her LE to lift and squat versus bending.    EVAL: Patient is a 68 y.o. female who was seen today for physical therapy evaluation and treatment for R radicular pain.  Pain radiates into R anterior thigh and lateral thigh. Knee tender to palpation anterior/medial. She presents with impairments in muscle strength of  bilateral hips, ankle DF, lumbar ROM, tenderness to palpation of her sacrum and L glut med, and pain that is radiating in nature. She is sleeping better with recent meds, but continues with disruption to ADLs and IADLs.   OBJECTIVE IMPAIRMENTS: Abnormal gait, decreased activity tolerance, decreased mobility, decreased ROM, decreased strength, hypomobility, increased fascial restrictions, impaired flexibility, impaired sensation, and pain.    GOALS: Goals reviewed with patient? Yes  SHORT TERM GOALS: Target date: 05/16/24  The patient will be indep with initial HEP Baseline: initiated at eval Goal status: INITIAL  2.  The patient will improve  modified oswestry by 8% to demonstrate improved functional abilities. Baseline:  42% Goal status: INITIAL  3.   The patient will report pain 7/10 at worst.  Baseline:  10/10 at worst Goal status: INITIAL  4.  The patient will report less radiating pain into R leg to centralize lumbar pain. Baseline:  Radiates into R lateral thigh along L4 dermatomal pattern Goal status: INITIAL  LONG TERM GOALS: Target date: 06/16/24  The patient will be indep with progression of HEP. Baseline:  initiated at eval Goal status: INITIAL  2.  The patient will improve modified oswestry by 15% to demonstrate improved functional abilities. Baseline: 42% Goal status: INITIAL  3.  The patient will improve lumbar ROM reporting no pain with lumbar flexion/extension. Baseline:  increases pain to 6/10 Goal status: INITIAL  4.  The patient will improve LE MMT strength to 5/5 bilaterally to demo improved strength for IADLs. Baseline:  see chart Goal status: INITIAL  5.  The patient will return to prior gym routine. Baseline:  Is not able to tolerate same intensity Goal status: INITIAL  PLAN:  PT FREQUENCY: 2x/week  PT DURATION: 8 weeks  PLANNED INTERVENTIONS: 02835- PT Re-evaluation, 97750- Physical Performance Testing, 97110-Therapeutic exercises, 97530-  Therapeutic activity, [id number]- Neuromuscular re-education, 97535- Self Care, 02859- Manual therapy, [phone]- Gait training, [phone]- Aquatic Therapy, [phone]- Electrical stimulation (unattended), [phone]- Traction (mechanical), 20560 (1-2 muscles), 20561 (3+ muscles)- Dry Needling, Patient/Family education, Stair training, Taping, Joint mobilization, Spinal mobilization, Cryotherapy, and Moist heat  PLAN FOR NEXT SESSION: Assess response to mechanical traction and continue if indicated; Further assess pain on R anterior knee-- (was tender to palpation patellar tendon),  Continue with progression of extension based exercises,  LE stretching (HS, quads, gastroc all need ). Try sciatic nerve glide supine   Mliss Cummins, PT  04/24/2024, 3:58 PM

## 2024-04-24 ENCOUNTER — Ambulatory Visit: Admitting: Physical Therapy

## 2024-04-24 ENCOUNTER — Encounter: Payer: Self-pay | Admitting: Physical Therapy

## 2024-04-24 DIAGNOSIS — M5459 Other low back pain: Secondary | ICD-10-CM | POA: Diagnosis not present

## 2024-04-24 DIAGNOSIS — M25561 Pain in right knee: Secondary | ICD-10-CM | POA: Diagnosis not present

## 2024-04-24 DIAGNOSIS — M6281 Muscle weakness (generalized): Secondary | ICD-10-CM

## 2024-04-24 DIAGNOSIS — R29818 Other symptoms and signs involving the nervous system: Secondary | ICD-10-CM

## 2024-04-24 DIAGNOSIS — G8929 Other chronic pain: Secondary | ICD-10-CM | POA: Diagnosis not present

## 2024-04-24 DIAGNOSIS — M5431 Sciatica, right side: Secondary | ICD-10-CM | POA: Diagnosis not present

## 2024-05-01 ENCOUNTER — Ambulatory Visit: Admitting: Rehabilitative and Restorative Service Providers"

## 2024-05-01 ENCOUNTER — Encounter: Payer: Self-pay | Admitting: Rehabilitative and Restorative Service Providers"

## 2024-05-01 DIAGNOSIS — M5459 Other low back pain: Secondary | ICD-10-CM

## 2024-05-01 DIAGNOSIS — R29818 Other symptoms and signs involving the nervous system: Secondary | ICD-10-CM | POA: Diagnosis not present

## 2024-05-01 DIAGNOSIS — M6281 Muscle weakness (generalized): Secondary | ICD-10-CM

## 2024-05-01 DIAGNOSIS — G8929 Other chronic pain: Secondary | ICD-10-CM | POA: Diagnosis not present

## 2024-05-01 DIAGNOSIS — M25561 Pain in right knee: Secondary | ICD-10-CM | POA: Diagnosis not present

## 2024-05-01 DIAGNOSIS — M5431 Sciatica, right side: Secondary | ICD-10-CM | POA: Diagnosis not present

## 2024-05-01 NOTE — Therapy (Signed)
 OUTPATIENT PHYSICAL THERAPY LOWER EXTREMITY TREATMENT   Patient Name: Mariah [name] MRN: [id number] DOB:28-Feb-1956, 68 y.o., female Today's Date: 05/01/2024  END OF SESSION:  PT End of Session - 05/01/24 1318     Visit Number 4    Number of Visits 16    Date for PT Re-Evaluation 06/15/24    Authorization Type [employer] Medicare-- submitting authorization    Progress Note Due on Visit 10    PT Start Time 1315    PT Stop Time 1400    PT Time Calculation (min) 45 min    Activity Tolerance Patient tolerated treatment well           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)    Pneumonia    03/08/17- many years ago   Polycystic kidney disease 11/2016   transplant    PONV (postoperative nausea and vomiting)    Post-transplant diabetes mellitus (HCC)    Pre-diabetes    due to antirejection medication   Ruptured cyst of kidney    SDH (subdural hematoma) (HCC)    Past Surgical History:  Procedure Laterality Date   BASCILIC VEIN TRANSPOSITION Left 09/07/2016   Procedure: BASILIC VEIN TRANSPOSITION LEFT UPPER ARM;  Surgeon: Carlin FORBES Haddock, MD;  Location: [facility] OR;  Service: Vascular;  Laterality: Left;   COLONOSCOPY  03/2004   repeat 10 years   Evacuation of subdural hematoma  11/26/2000   KIDNEY STONE SURGERY     Cysto   KIDNEY TRANSPLANT     Nov 23, 2016   LEFT ATRIAL APPENDAGE OCCLUSION  06/03/2017   Dr. Dasie    LIGATION OF ARTERIOVENOUS  FISTULA Left 03/09/2017   Procedure: LIGATION OF LEFT BASILIC VEIN TRANSPOSITION;  Surgeon: Haddock Carlin FORBES, MD;  Location: [facility] OR;  Service: Vascular;  Laterality:  Left;   NEPHRECTOMY  08/03/2021   ORIF ELBOW FRACTURE  10/22/2011   Procedure: OPEN REDUCTION INTERNAL FIXATION (ORIF) ELBOW/OLECRANON FRACTURE;  Surgeon: Donnice DELENA Robinsons, MD;  Location: [facility];  Service: Orthopedics;  Laterality: Left;  open reduction  possible lateral reconstruction with palmaris graft from left or right side.   RADIAL HEAD IMPLANT  09/24/2011   Procedure: RADIAL HEAD IMPLANT;  Surgeon: Donnice DELENA Robinsons, MD;  Location: [facility];  Service: Orthopedics;  Laterality: Left;  left radial head replacement   REPLACEMENT TOTAL KNEE  12/13/2020   Dr. 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) 12/16/2016   Status post living-donor kidney transplantation 11/29/2016   Immunosuppression (HCC) 11/29/2016   Adenomatous  polyp of colon 05/12/2016   Poor venous access 10/28/2015   Hypertension 06/16/2015   Hyperlipidemia 06/16/2015   Mitral regurgitation 11/20/2014   CKD (chronic kidney disease) stage 4, GFR 15-29 ml/min (HCC) 11/04/2014   Primary osteoarthritis of left knee, status post right total knee arthroplasty 10/10/2014   Iron  deficiency anemia, unspecified 09/25/2014   Tachycardia 09/11/2014   Bilateral leg edema 09/11/2014   Primary gout 09/11/2014   Anxiety 07/11/2014   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   Atrial fibrillation (HCC) 01/19/2013   Kidney disease, chronic, stage III (GFR 30-59 ml/min) (HCC) 11/06/2012   Abnormal levels of other serum enzymes  10/13/2010   Vitamin D deficiency 10/09/2010   Gout 10/09/2010   Polycystic kidney 10/08/2008   HYPERLIPIDEMIA 07/19/2006   Class 2 severe obesity due to excess calories with serious comorbidity and body mass index (BMI) of 35.0 to 35.9 in adult (HCC) 07/19/2006   HYPERTENSION, BENIGN SYSTEMIC 07/19/2006   OSTEOARTHRITIS, MULTI SITES 07/19/2006    PCP: Vermell Bologna, PA REFERRING PROVIDER: Alvan Craven, MD REFERRING DIAG:  [phone] (ICD-10-CM) - Chronic pain of right knee  M54.31 (ICD-10-CM) - Sciatica of right side   THERAPY DIAG:  Other low back pain  Muscle weakness (generalized)  Other symptoms and signs involving the nervous system  Rationale for Evaluation and Treatment: Rehabilitation  ONSET DATE: 04/10/2024  SUBJECTIVE:  SUBJECTIVE STATEMENT: No pain in the LB  or LE pain today. Has not had any pain in last week. Has not tripped. She has been doing her exercises at home. She returned to silver sneakers and started working with her personal trainer last week with no problem.    EVAL:The patient reports sudden onset of back pain 3 weeks ago after attempting to hook a siphon up to her hose and having to stay in a flexed position for an extended period. When she came back inside, pain was present. Meds are helping with nighttime sleep (prior to that it was keeping her up all night). She has an antalgic pattern with gait. She is having radiating pain down into the R calf muscle and had foot cramps yesterday.  She has weakness in R leg like her knee could give out. Patient goes to silver sneakers class 1x/week and works with Theatre stage manager at [employer].  PERTINENT HISTORY: H/o SDH, HTN, hyperlipidemia, a-fib, tachycardia, kidney transplant recipient PAIN:  Are you having pain? Yes: NPRS scale: 0/10 at worst, takes meds at night is now able to sleep Pain location: low back radiating into R leg Pain description: acute, severe pain Aggravating factors:  can't lay flat (side sleeping), worse at night, bending forward Relieving factors: rest, meds at night  PRECAUTIONS: None  RED FLAGS: Denies bowel/bladder issues, saddle paresthesia  WEIGHT BEARING RESTRICTIONS: No  FALLS:  Has patient fallen in last 6 months? 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  DIAGNOSTIC FINDINGS: CT scan from 2016 that did show DDD L4-5 and L5-S1 with vacuum disc phenomenon L5-S1.  PATIENT SURVEYS:  Modified Oswestry:  42%   SENSATION: Right knee has numbness  MUSCLE LENGTH: Hamstrings: Right -20 deg; Left -20 deg Quad tightness bilatrally Gastroc tightness bilaterally  POSTURE: No Significant postural limitations  PALPATION: Tender to touch  over L and R PSIS, tender to touch glut med on the L side *significant tenderness to palpation over the R knee anterior medial region (pes anserine) that remains tender after palpation  LUMBAR ROM: pain is 2-3/10 prior to testing (goes up to 6/10 with AROM) Active  A/PROM  eval  Flexion 50%  Limited 7/10  Extension 50% limited  Right lateral flexion 50% limited  Left lateral flexion 50% limited  Right rotation 50% limited  Left rotation 50% limited   (Blank rows = not tested)  LOWER EXTREMITY MMT: MMT Right eval Left eval  Hip flexion 3+3+/5 4/5  3+/5  4/5 L  Hip extension    Hip abduction    Hip adduction    Hip internal rotation    Hip external rotation    Knee flexion 5/5 5/5  Knee extension 5/5 5/5  Ankle dorsiflexion 3+/5 4/5  Ankle plantarflexion    Ankle inversion    Ankle eversion     (Blank rows = not tested)  GAIT: Distance walked: 70 ft Assistive device utilized:  None Level of assistance: Complete Independence Comments: antalgic gait pattern R LE  [facility] Adult PT Treatment:                                                DATE: 05/01/24 Therapeutic Exercise: Prone lying x 3 min decreases pain to 3/10 in thigh Prone press up 2 sec x 10 abolishes pain - return of sx upon standing and walking one lap Neuromuscular re-ed: Prone hip ext x 10 R/L Plank elbows and knees 5 sec hold x 5 sec  Therapeutic activities;   Bridging 5 sec x 10   Hip abduction alternating LE green TB 3 x 10   Bridging with green TB above knees TB 3 x 10   Sit to stand arms crossed x 10 x 2  Row blue TB x 10 x 2  Antirotation blue TB x 10 x 2    Self Care: Avoidance of flexion activities  ADL/body mechanic hinged back versus rounded back for bending Lifting with hinged hip from 8 inch surface 10 # KB x 10 x 2    [facility] Adult PT Treatment:                                                DATE: 04/24/24 Therapeutic Exercise: Prone lying x 3 min decreases pain to 3/10 in thigh Prone press up 2 sec x 10 abolishes pain - return of sx upon standing and walking one lap Manual traction with belt, feet on orange plyo ball - pt reports relief Modalities: Lumbar mechanical traction 60#/ 20# 60 sec on/20 sec off x 10 min Neuromuscular re-ed: Prone hip ext x 10 with pelvic press Plank elbows and knees 5 sec hold x 5 sec Self Care: Avoidance of flexion activities  ADL/body Curator h/o - went over partially including hinged back versus rounded back for bending.  [facility] Adult PT Treatment:                                                DATE: 04/19/24 Therapeutic Exercise: Prone prop x 3 Prone press up 2 sec x 10 (repeated after hamstring stretch increased radicular pain) Hamstring stretch with strap 30 sec x 3  Piriformis stretch supine travell 30 sec x 3 with strap and PT  assist  Hamstring stretch sitting hinged hip 30 sec x 3 Rt (better tolerated) Manual Therapy: STM Rt posterior hip  IR/ER Rt hip patient prone knee flexed  Passive Rt knee flexion 20 sec x 3  Neuromuscular re-ed: Attempt to engage core - difficulty with TA activation likely due to multiple surgeries  Therapeutic Activity: Standing trunk extension 2-3 sec x 3   Self Care: Myofacial ball released standing posterior hip  Sitting posture and alignment - suggestions for modifying chair  Back education re transfers and transitional movements - needs continued education    [facility] Adult PT Treatment:                                                DATE: 04/16/2024 Therapeutic Exercise: Supine Bridging x 10 reps Marching x 10 reps with TA activation Hamstring stretch Prone  On elbows for extension  PATIENT EDUCATION:  Education details: HEP Person educated: Patient Education method: Programmer, multimedia, Facilities manager, and Handouts Education comprehension: verbalized understanding, returned demonstration, and needs further education  HOME EXERCISE PROGRAM: Access Code: [email or web address] URL: [email or web address] Date: 05/01/2024 Prepared by: Devantae Babe  Exercises - Supine Bridge  - 1 x daily - 5 x weekly - 2 sets - 10 reps - Supine March  - 1 x daily - 5 x weekly - 2 sets - 10 reps - Hooklying Hamstring Stretch with Strap  - 1 x daily - 5 x weekly - 1 sets - 2 reps - 30 seconds hold - Prone on Elbows Stretch  - 2 x daily - 5 x weekly - 1 sets - 1 reps - 2 minutes hold - Prone Press Up  - 2 x daily - 7 x weekly - 1 sets - 10 reps - 2-3 sec  hold - Supine Piriformis Stretch with Leg Straight  - 1 x daily - 7 x weekly - 1 sets - 3 reps - 30 sec  hold - Seated Hamstring Stretch  - 1-2 x daily - 7 x weekly - 1 sets - 3 reps - 30 sec  hold - Standing Lumbar Extension  - 2 x daily - 7 x weekly - 1 sets - 2-3 reps - 2-3 sec  hold - Standing Piriformis Release with Ball at [employer]  - 1 x daily - 7  x weekly - 30-60 sec  hold - Supine Transversus Abdominis Bracing with Pelvic Floor Contraction  - 1 x daily - 7 x weekly - 1 sets - 10 reps - 10sec  hold - Hooklying Isometric Clamshell  - 1 x daily - 7 x weekly - 1 sets - 10 reps - 3 sec  hold - Supine Bridge with Resistance Band  - 1 x daily - 7 x weekly - 1-2 sets - 10 reps - 5-10 sec  hold - Sit to  Stand  - 1 x daily - 7 x weekly - 1 sets - 10 reps - 3-5 sec  hold - Standing Bilateral Low Shoulder Row with Anchored Resistance  - 2 x daily - 7 x weekly - 1-3 sets - 10 reps - 2-3 sec  hold - Anti-Rotation Lateral Stepping with Press  - 2 x daily - 7 x weekly - 1-2 sets - 10 reps - 2-3 sec  hold  Patient Education - Posture and Body Mechanics  ASSESSMENT:  CLINICAL IMPRESSION: Patient reports that she is doing well and has no pain in the back and LE. She is working with her Systems analyst and has returned to silver sneakers without difficulty. Will hold traction since she has no symptoms. Progressing with core stabilization and strengthening. Continued work on ADL's including hinged hip lifting.     EVAL: Patient is a 68 y.o. female who was seen today for physical therapy evaluation and treatment for R radicular pain.  Pain radiates into R anterior thigh and lateral thigh. Knee tender to palpation anterior/medial. She presents with impairments in muscle strength of bilateral hips, ankle DF, lumbar ROM, tenderness to palpation of her sacrum and L glut med, and pain that is radiating in nature. She is sleeping better with recent meds, but continues with disruption to ADLs and IADLs.   OBJECTIVE IMPAIRMENTS: Abnormal gait, decreased activity tolerance, decreased mobility, decreased ROM, decreased strength, hypomobility, increased fascial restrictions, impaired flexibility, impaired sensation, and pain.    GOALS: Goals reviewed with patient? Yes  SHORT TERM GOALS: Target date: 05/16/24  The patient will be indep with initial HEP Baseline:  initiated at eval Goal status: INITIAL  2.  The patient will improve modified oswestry by 8% to demonstrate improved functional abilities. Baseline:  42% Goal status: INITIAL  3.   The patient will report pain 7/10 at worst.  Baseline:  10/10 at worst Goal status: INITIAL  4.  The patient will report less radiating pain into R leg to centralize lumbar pain. Baseline:  Radiates into R lateral thigh along L4 dermatomal pattern Goal status: INITIAL  LONG TERM GOALS: Target date: 06/16/24  The patient will be indep with progression of HEP. Baseline:  initiated at eval Goal status: INITIAL  2.  The patient will improve modified oswestry by 15% to demonstrate improved functional abilities. Baseline: 42% Goal status: INITIAL  3.  The patient will improve lumbar ROM reporting no pain with lumbar flexion/extension. Baseline:  increases pain to 6/10 Goal status: INITIAL  4.  The patient will improve LE MMT strength to 5/5 bilaterally to demo improved strength for IADLs. Baseline:  see chart Goal status: INITIAL  5.  The patient will return to prior gym routine. Baseline:  Is not able to tolerate same intensity Goal status: INITIAL  PLAN:  PT FREQUENCY: 2x/week  PT DURATION: 8 weeks  PLANNED INTERVENTIONS: 02835- PT Re-evaluation, 97750- Physical Performance Testing, 97110-Therapeutic exercises, 97530- Therapeutic activity, [id number]- Neuromuscular re-education, 97535- Self Care, [id number]- Manual therapy, [phone]- Gait training, [phone]- Aquatic Therapy, [phone]- Electrical stimulation (unattended), [phone]- Traction (mechanical), 20560 (1-2 muscles), 20561 (3+ muscles)- Dry Needling, Patient/Family education, Stair training, Taping, Joint mobilization, Spinal mobilization, Cryotherapy, and Moist heat  PLAN FOR NEXT SESSION: Assess response to mechanical traction and continue if indicated; Further assess pain on R anterior knee-- (was tender to palpation patellar tendon),  Continue with progression  of extension based exercises,  LE stretching (HS, quads, gastroc all need ). Try sciatic nerve glide supine  Tamyka Bezio P. Ina PT, MPH 05/01/24 1:20 PM

## 2024-05-03 ENCOUNTER — Encounter: Payer: Self-pay | Admitting: Rehabilitative and Restorative Service Providers"

## 2024-05-03 ENCOUNTER — Ambulatory Visit: Admitting: Rehabilitative and Restorative Service Providers"

## 2024-05-03 DIAGNOSIS — M25561 Pain in right knee: Secondary | ICD-10-CM | POA: Diagnosis not present

## 2024-05-03 DIAGNOSIS — M6281 Muscle weakness (generalized): Secondary | ICD-10-CM | POA: Diagnosis not present

## 2024-05-03 DIAGNOSIS — R29818 Other symptoms and signs involving the nervous system: Secondary | ICD-10-CM

## 2024-05-03 DIAGNOSIS — G8929 Other chronic pain: Secondary | ICD-10-CM | POA: Diagnosis not present

## 2024-05-03 DIAGNOSIS — M5459 Other low back pain: Secondary | ICD-10-CM | POA: Diagnosis not present

## 2024-05-03 DIAGNOSIS — M5431 Sciatica, right side: Secondary | ICD-10-CM | POA: Diagnosis not present

## 2024-05-03 NOTE — Therapy (Addendum)
 OUTPATIENT PHYSICAL THERAPY LOWER EXTREMITY TREATMENT PHYSICAL THERAPY DISCHARGE SUMMARY  Visits from Start of Care: 5  Current functional level related to goals / functional outcomes: See progress note for discharge status    Remaining deficits: Continued pain in the LB    Education / Equipment: HEP    Patient agrees to discharge. Patient goals were not met. Patient is being discharged due to patient's request .  Wesleigh Markovic P. Ina PT, MPH 05/09/24 3:06 PM     Patient Name: Mariah [name] MRN: [id number] DOB:1956-04-15, 68 y.o., female Today's Date: 05/03/2024  END OF SESSION:  PT End of Session - 05/03/24 1316     Visit Number 5    Number of Visits 16    Date for PT Re-Evaluation 06/15/24    Authorization Type [employer] Medicare-- submitting authorization    Progress Note Due on Visit 10    PT Start Time 1315    PT Stop Time 1400    PT Time Calculation (min) 45 min    Activity Tolerance Patient tolerated treatment well           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)    Pneumonia    03/08/17- many years ago   Polycystic kidney disease 11/2016   transplant    PONV (postoperative nausea and vomiting)    Post-transplant diabetes mellitus (HCC)    Pre-diabetes    due to antirejection medication   Ruptured cyst of kidney    SDH (subdural hematoma) (HCC)    Past Surgical History:  Procedure Laterality Date   BASCILIC VEIN TRANSPOSITION Left 09/07/2016   Procedure: BASILIC VEIN TRANSPOSITION LEFT UPPER ARM;  Surgeon: Carlin FORBES Haddock, MD;  Location: [facility] OR;  Service: Vascular;  Laterality:  Left;   COLONOSCOPY  03/2004   repeat 10 years   Evacuation of subdural hematoma  11/26/2000   KIDNEY STONE SURGERY     Cysto   KIDNEY TRANSPLANT     Nov 23, 2016   LEFT ATRIAL APPENDAGE OCCLUSION  06/03/2017   Dr. Dasie    LIGATION OF ARTERIOVENOUS  FISTULA Left 03/09/2017   Procedure: LIGATION OF LEFT BASILIC VEIN TRANSPOSITION;  Surgeon: Haddock Carlin FORBES, MD;  Location: [facility] OR;  Service: Vascular;  Laterality: Left;   NEPHRECTOMY  08/03/2021   ORIF ELBOW FRACTURE  10/22/2011   Procedure: OPEN REDUCTION INTERNAL FIXATION (ORIF) ELBOW/OLECRANON FRACTURE;  Surgeon: Donnice DELENA Robinsons, MD;  Location: [facility];  Service: Orthopedics;  Laterality: Left;  open reduction  possible lateral reconstruction with palmaris graft from left or right side.   RADIAL HEAD IMPLANT  09/24/2011   Procedure: RADIAL HEAD IMPLANT;  Surgeon: Donnice DELENA Robinsons, MD;  Location: [facility];  Service: Orthopedics;  Laterality: Left;  left radial head replacement   REPLACEMENT TOTAL KNEE  12/13/2020   Dr. 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) 12/16/2016   Status post living-donor kidney transplantation 11/29/2016   Immunosuppression (HCC) 11/29/2016   Adenomatous polyp of colon 05/12/2016   Poor venous access 10/28/2015   Hypertension 06/16/2015   Hyperlipidemia 06/16/2015   Mitral regurgitation 11/20/2014   CKD (chronic kidney disease) stage 4, GFR 15-29 ml/min (HCC) 11/04/2014   Primary osteoarthritis of left knee,  status post right total knee arthroplasty 10/10/2014   Iron  deficiency anemia, unspecified 09/25/2014   Tachycardia 09/11/2014   Bilateral leg edema 09/11/2014   Primary gout 09/11/2014   Anxiety 07/11/2014   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   Atrial fibrillation (HCC) 01/19/2013   Kidney disease, chronic, stage III (GFR 30-59 ml/min) (HCC) 11/06/2012   Abnormal levels of other serum enzymes 10/13/2010   Vitamin D deficiency 10/09/2010   Gout 10/09/2010   Polycystic kidney 10/08/2008   HYPERLIPIDEMIA 07/19/2006   Class 2 severe obesity due to excess calories with serious comorbidity and body mass index (BMI) of 35.0 to 35.9 in adult (HCC) 07/19/2006   HYPERTENSION, BENIGN SYSTEMIC 07/19/2006   OSTEOARTHRITIS, MULTI SITES 07/19/2006    PCP: Vermell Bologna, PA REFERRING PROVIDER: Alvan Craven, MD REFERRING DIAG:  [phone] (ICD-10-CM) - Chronic pain of right knee  M54.31 (ICD-10-CM) - Sciatica of right side   THERAPY DIAG:  Other low back pain  Muscle weakness (generalized)  Other symptoms and signs involving the nervous system  Rationale for Evaluation and Treatment: Rehabilitation  ONSET DATE: 04/10/2024  SUBJECTIVE:  SUBJECTIVE STATEMENT: Patient reports pain in the LB and in the R knee with symptoms increasing Tuesday afternoon after therapy. She is not sure what irritated symptoms. She has been doing her exercises at home but she hs had no change in pain. She has not been to silver sneakers and started working with her personal trainer since Tuesday.   EVAL:The patient reports sudden onset of back pain 3 weeks ago after attempting to hook a siphon up to her hose and having to stay in a flexed position for an extended period. When she came back inside, pain was present. Meds are helping with nighttime sleep (prior to that it was keeping her up all night). She has an antalgic pattern with gait. She is having radiating pain down into the R calf  muscle and had foot cramps yesterday.  She has weakness in R leg like her knee could give out. Patient goes to silver sneakers class 1x/week and works with Theatre stage manager at [employer].  PERTINENT HISTORY: H/o SDH, HTN, hyperlipidemia, a-fib, tachycardia, kidney transplant recipient PAIN:  Are you having pain? Yes: NPRS scale: 5-6/10 at worst, takes meds at night is now able to sleep Pain location: low back radiating into R leg Pain description: acute, severe pain Aggravating factors: can't lay flat (side sleeping), worse at night, bending forward Relieving factors: rest, meds at night  PRECAUTIONS: None  RED FLAGS: Denies bowel/bladder issues, saddle paresthesia  WEIGHT BEARING RESTRICTIONS: No  FALLS:  Has patient fallen in last 6 months? 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  DIAGNOSTIC FINDINGS: CT scan from 2016 that did show DDD L4-5 and L5-S1 with vacuum disc phenomenon L5-S1.  PATIENT SURVEYS:  Modified Oswestry:  42%   SENSATION: Right knee has numbness  MUSCLE LENGTH: Hamstrings: Right -20 deg; Left -20 deg Quad tightness bilatrally Gastroc tightness bilaterally  POSTURE: No Significant postural limitations  PALPATION: Tender to touch over L and R PSIS, tender to touch glut med on the L side *significant tenderness to palpation over the R knee anterior medial region (pes anserine) that remains tender after palpation  LUMBAR ROM: pain is 2-3/10 prior to testing (goes up to 6/10 with AROM) Active  A/PROM  eval  Flexion 50%  Limited 7/10  Extension 50% limited  Right lateral flexion 50% limited  Left lateral flexion 50% limited  Right  rotation 50% limited  Left rotation 50% limited   (Blank rows = not tested)  LOWER EXTREMITY MMT: MMT Right eval Left eval  Hip flexion 3+3+/5 4/5  3+/5  4/5 L  Hip extension    Hip abduction    Hip adduction    Hip internal rotation    Hip external rotation    Knee flexion 5/5 5/5  Knee extension 5/5 5/5  Ankle dorsiflexion 3+/5 4/5  Ankle plantarflexion    Ankle inversion    Ankle eversion     (Blank rows = not tested)  GAIT: Distance walked: 70 ft Assistive device utilized: None Level of assistance: Complete Independence Comments: antalgic gait pattern R LE  [facility] Adult PT Treatment:                                                DATE: 05/03/24 Therapeutic Exercise: Prone lying x 3 min decreases pain to 3/10 in thigh Prone press up 2 sec x 10 abolishes pain - return of sx upon standing and walking one lap Modalities: Lumbar mechanical traction 60#/ 30#;  60 sec on/20 sec off x 10 min  Manual: Lumbar mobs Grade II STM through R lumbar QL and lumbar paraspinals into the R posterior hip Therapeutic activities;  Self Care: Avoidance of flexion activities  ADL/body mechanic hinged back versus rounded back for bending Lifting with hinged hip from 8 inch surface 10 # KB x 10 x 2    [facility] Adult PT Treatment:                                                DATE: 05/01/24 Therapeutic Exercise: Prone lying x 3 min decreases pain to 3/10 in thigh Prone press up 2 sec x 10 abolishes pain - return of sx upon standing and walking one lap Modalities: Lumbar mechanical traction 60#/ 20# 60 sec on/20 sec off x 10 min Neuromuscular re-ed: Prone hip ext x 10 R/L Plank elbows and knees 5 sec hold x 5 sec  Therapeutic activities;   Bridging 5 sec x 10   Hip abduction alternating LE green TB 3 x 10   Bridging with green TB above knees TB 3 x 10   Sit to stand arms crossed x 10 x 2  Row blue TB x 10 x 2  Antirotation blue TB x 10 x 2    Self Care: Avoidance of flexion  activities  ADL/body mechanic hinged back versus rounded back for bending Lifting with hinged hip from 8 inch surface 10 # KB x 10 x 2    [facility] Adult PT Treatment:                                                DATE: 04/24/24 Therapeutic Exercise: Prone lying x 3 min decreases pain to 3/10 in thigh Prone press up 2 sec x 10 abolishes pain - return of sx upon standing and walking one lap Manual traction with belt, feet on orange plyo ball - pt reports relief Modalities: Lumbar mechanical traction 60#/ 20# 60 sec on/20 sec off x 10 min Neuromuscular re-ed: Prone hip ext x 10 with pelvic press Plank elbows and knees 5 sec hold x 5 sec Self Care: Avoidance of flexion activities  ADL/body Curator h/o - went over partially including hinged back versus rounded back for bending.                                                                                                                       [facility] Adult PT Treatment:                                                DATE: 04/19/24 Therapeutic Exercise: Prone prop x 3 Prone press up 2 sec x 10 (repeated after hamstring stretch increased radicular pain) Hamstring stretch with strap 30 sec x 3  Piriformis stretch supine travell 30 sec x 3 with strap and PT assist  Hamstring stretch sitting hinged hip 30 sec x 3 Rt (better tolerated) Manual Therapy: STM Rt posterior hip  IR/ER Rt hip patient prone knee flexed  Passive Rt knee flexion 20 sec x 3  Neuromuscular re-ed: Attempt to engage core - difficulty with TA activation likely due to multiple surgeries  Therapeutic Activity: Standing trunk extension 2-3 sec x 3   Self Care: Myofacial ball released standing posterior hip  Sitting posture and alignment - suggestions for modifying chair  Back education re transfers and transitional movements - needs continued education    [facility] Adult PT Treatment:                                                DATE: 04/16/2024 Therapeutic  Exercise: Supine Bridging x 10 reps Marching x 10 reps with TA activation Hamstring stretch Prone  On elbows for extension  PATIENT EDUCATION:  Education details:  HEP Person educated: Patient Education method: Explanation, Demonstration, and Handouts Education comprehension: verbalized understanding, returned demonstration, and needs further education  HOME EXERCISE PROGRAM: Access Code: [email or web address] URL: [email or web address] Date: 05/01/2024 Prepared by: Marquisa Salih  Exercises - Supine Bridge  - 1 x daily - 5 x weekly - 2 sets - 10 reps - Supine March  - 1 x daily - 5 x weekly - 2 sets - 10 reps - Hooklying Hamstring Stretch with Strap  - 1 x daily - 5 x weekly - 1 sets - 2 reps - 30 seconds hold - Prone on Elbows Stretch  - 2 x daily - 5 x weekly - 1 sets - 1 reps - 2 minutes hold - Prone Press Up  - 2 x daily - 7 x weekly - 1 sets - 10 reps - 2-3 sec  hold - Supine Piriformis Stretch with Leg Straight  - 1 x daily - 7 x weekly - 1 sets - 3 reps - 30 sec  hold - Seated Hamstring Stretch  - 1-2 x daily - 7 x weekly - 1 sets - 3 reps - 30 sec  hold - Standing Lumbar Extension  - 2 x daily - 7 x weekly - 1 sets - 2-3 reps - 2-3 sec  hold - Standing Piriformis Release with Ball at [employer]  - 1 x daily - 7 x weekly - 30-60 sec  hold - Supine Transversus Abdominis Bracing with Pelvic Floor Contraction  - 1 x daily - 7 x weekly - 1 sets - 10 reps - 10sec  hold - Hooklying Isometric Clamshell  - 1 x daily - 7 x weekly - 1 sets - 10 reps - 3 sec  hold - Supine Bridge with Resistance Band  - 1 x daily - 7 x weekly - 1-2 sets - 10 reps - 5-10 sec  hold - Sit to Stand  - 1 x daily - 7 x weekly - 1 sets - 10 reps - 3-5 sec  hold - Standing Bilateral Low Shoulder Row with Anchored Resistance  - 2 x daily - 7 x weekly - 1-3 sets - 10 reps - 2-3 sec  hold - Anti-Rotation Lateral Stepping with Press  - 2 x daily - 7 x weekly - 1-2 sets - 10 reps - 2-3 sec  hold  Patient Education -  Posture and Body Mechanics  ASSESSMENT:  CLINICAL IMPRESSION: Patient reports that she has had increased pain in the back and R knee following last appointment. Some improvement in pain following prone extension; manual work and lumbar traction. Symptoms today are consistent with muscular involvement. Patient reminded to work on extension program; avoid sitting; and continue with core stabilization. Continue with rehab as symptoms allow and patient tolerates.    EVAL: Patient is a 68 y.o. female who was seen today for physical therapy evaluation and treatment for R radicular pain.  Pain radiates into R anterior thigh and lateral thigh. Knee tender to palpation anterior/medial. She presents with impairments in muscle strength of bilateral hips, ankle DF, lumbar ROM, tenderness to palpation of her sacrum and L glut med, and pain that is radiating in nature. She is sleeping better with recent meds, but continues with disruption to ADLs and IADLs.   OBJECTIVE IMPAIRMENTS: Abnormal gait, decreased activity tolerance, decreased mobility, decreased ROM, decreased strength, hypomobility, increased fascial restrictions, impaired flexibility, impaired sensation, and pain.    GOALS: Goals reviewed with patient? Yes  SHORT TERM GOALS: Target date: 05/16/24  The patient will be indep with initial HEP Baseline: initiated at eval Goal status: INITIAL  2.  The patient will improve modified oswestry by 8% to demonstrate improved functional abilities. Baseline:  42% Goal status: INITIAL  3.   The patient will report pain 7/10 at worst.  Baseline:  10/10 at worst Goal status: INITIAL  4.  The patient will report less radiating pain into R leg to centralize lumbar pain. Baseline:  Radiates into R lateral thigh along L4 dermatomal pattern Goal status: INITIAL  LONG TERM GOALS: Target date: 06/16/24  The patient will be indep with progression of HEP. Baseline:  initiated at eval Goal status:  INITIAL  2.  The patient will improve modified oswestry by 15% to demonstrate improved functional abilities. Baseline: 42% Goal status: INITIAL  3.  The patient will improve lumbar ROM reporting no pain with lumbar flexion/extension. Baseline:  increases pain to 6/10 Goal status: INITIAL  4.  The patient will improve LE MMT strength to 5/5 bilaterally to demo improved strength for IADLs. Baseline:  see chart Goal status: INITIAL  5.  The patient will return to prior gym routine. Baseline:  Is not able to tolerate same intensity Goal status: INITIAL  PLAN:  PT FREQUENCY: 2x/week  PT DURATION: 8 weeks  PLANNED INTERVENTIONS: 02835- PT Re-evaluation, 97750- Physical Performance Testing, 97110-Therapeutic exercises, 97530- Therapeutic activity, [id number]- Neuromuscular re-education, 97535- Self Care, 02859- Manual therapy, [phone]- Gait training, [phone]- Aquatic Therapy, [phone]- Electrical stimulation (unattended), [phone]- Traction (mechanical), 20560 (1-2 muscles), 20561 (3+ muscles)- Dry Needling, Patient/Family education, Stair training, Taping, Joint mobilization, Spinal mobilization, Cryotherapy, and Moist heat  PLAN FOR NEXT SESSION: Assess response to mechanical traction and continue if indicated; Further assess pain on R anterior knee-- (was tender to palpation patellar tendon),  Continue with progression of extension based exercises,  LE stretching (HS, quads, gastroc all need ). Try sciatic nerve glide supine   Brynlei Klausner P. Ina PT, MPH 05/03/24 2:14 PM

## 2024-05-08 ENCOUNTER — Encounter: Admitting: Rehabilitative and Restorative Service Providers"

## 2024-05-09 ENCOUNTER — Encounter: Payer: Self-pay | Admitting: Sports Medicine

## 2024-05-09 ENCOUNTER — Encounter: Payer: Self-pay | Admitting: Rehabilitative and Restorative Service Providers"

## 2024-05-10 ENCOUNTER — Encounter: Admitting: Rehabilitative and Restorative Service Providers"

## 2024-05-11 ENCOUNTER — Ambulatory Visit: Payer: Self-pay

## 2024-05-11 NOTE — Telephone Encounter (Signed)
 FYI Only or Action Required?: FYI only for provider.  Patient was last seen in primary care on 04/12/2024 by Curtis Debby PARAS, MD.  Called Nurse Triage reporting Dizziness.  Symptoms began several weeks ago.  Interventions attempted: Nothing.  Symptoms are: unchanged.  Triage Disposition: See PCP When Office is Open (Within 3 Days)  Patient/caregiver understands and will follow disposition?: No appointment in office, patient states she will go somewhere else to be seen for her dizziness      Copied from CRM [id number]. Topic: Clinical - Red Word Triage >> May 11, 2024  1:33 PM Carrielelia G wrote: [facility] that prompted transfer to Nurse Triage: dizziness      Reason for Disposition  [1] MILD dizziness (e.g., walking normally) AND [2] has NOT been evaluated by doctor (or NP/PA) for this  (Exception: Dizziness caused by heat exposure, sudden standing, or poor fluid intake.)  Answer Assessment - Initial Assessment Questions 1. DESCRIPTION: Describe your dizziness.     Lightheaded  2. LIGHTHEADED: Do you feel lightheaded? (e.g., somewhat faint, woozy, weak upon standing)     Yes 3. VERTIGO: Do you feel like either you or the room is spinning or tilting? (i.e., vertigo)     No 4. SEVERITY: How bad is it?  Do you feel like you are going to faint? Can you stand and walk?     Mild  5. ONSET:  When did the dizziness begin?     3-4 weeks 6. AGGRAVATING FACTORS: Does anything make it worse? (e.g., standing, change in head position)     Standing up aggravates dizziness  8. CAUSE: What do you think is causing the dizziness? (e.g., decreased fluids or food, diarrhea, emotional distress, heat exposure, new medicine, sudden standing, vomiting; unknown)     Unsure  9. RECURRENT SYMPTOM: Have you had dizziness before? If Yes, ask: When was the last time? What happened that time?     Occasionally, but getting worse  10. OTHER SYMPTOMS: Do you have any other  symptoms? (e.g., fever, chest pain, vomiting, diarrhea, bleeding)       No  Protocols used: Dizziness - Lightheadedness-A-AH

## 2024-05-14 NOTE — Telephone Encounter (Signed)
 Attempted call to patient. Left a voice mail message requesting a return call.

## 2024-05-15 ENCOUNTER — Ambulatory Visit: Admitting: Rehabilitative and Restorative Service Providers"

## 2024-05-15 ENCOUNTER — Telehealth: Payer: Self-pay | Admitting: Physician Assistant

## 2024-05-15 ENCOUNTER — Telehealth: Payer: Self-pay

## 2024-05-15 NOTE — Telephone Encounter (Signed)
 Copied from CRM [id number]. Topic: Appointments - Transfer of Care >> May 14, 2024  5:05 PM [name] wrote: Pt is requesting to transfer FROM: Mariah [name] Pt is requesting to transfer TO: Mariah [name] Reason for requested transfer: Establish care with new provider due to [name]'s availability It is the responsibility of the team the patient would like to transfer to (Dr. Darice [name]) to reach out to the patient if for any reason this transfer is not acceptable.

## 2024-05-15 NOTE — Telephone Encounter (Signed)
 Patient is scheduled for 05/22/2024 for transfer of care to Darice Brownie, NP

## 2024-05-15 NOTE — Telephone Encounter (Signed)
 Copied from CRM [id number]. Topic: General - Call Back - No Documentation >> May 14, 2024  4:57 PM Graeme ORN wrote: Reason for CRM: Patient called. Returned call to [employer]. Called CAL - no answer. Let he know they were calling to schedule her. She says she has changed to [facility]. Appt not needed. Thank You    ----------------------------------------------------------------------- From previous Reason for Contact - Scheduling: Patient/patient representative is calling to schedule an appointment. Refer to attachments for appointment information.

## 2024-05-17 ENCOUNTER — Encounter: Admitting: Rehabilitative and Restorative Service Providers"

## 2024-05-22 ENCOUNTER — Ambulatory Visit (INDEPENDENT_AMBULATORY_CARE_PROVIDER_SITE_OTHER): Admitting: Family Medicine

## 2024-05-22 ENCOUNTER — Encounter: Payer: Self-pay | Admitting: Family Medicine

## 2024-05-22 VITALS — BP 124/53 | HR 63 | Temp 98.2°F | Ht 62.0 in | Wt 198.0 lb

## 2024-05-22 DIAGNOSIS — Z7689 Persons encountering health services in other specified circumstances: Secondary | ICD-10-CM | POA: Insufficient documentation

## 2024-05-22 DIAGNOSIS — M62838 Other muscle spasm: Secondary | ICD-10-CM | POA: Diagnosis not present

## 2024-05-22 NOTE — Progress Notes (Signed)
 New Patient Office Visit  Subjective    Patient ID: Mariah [name], female    DOB: Aug 28, 1956  Age: 68 y.o. MRN: [id number]  CC:  Chief Complaint  Patient presents with   Establish Care    [facility]    HPI Mariah [name] presents to establish care with this provider. She is transferring care from previous provider due to availability. Kidney transplant done 11/23/2016 at [facility]. Polycystic kidney disease.   History and medications reviewed.   Dr. Alyne, nephrology: manages Prograf , Metformin, Myfortic , Bactrim ,  atorvastatin , venlafaxine , asa, magnesium . She is in the process of weaning off of venlafaxine .   Cardiologist,  Dr. Dasie, with [facility]: manages  Sotalol   PCP manages:  flexeril  5mg  at night as needed for spasms.   Sports medicine manages gabapentin .   GYN: well woman exam.   Chart review: 01/03/24: Sports medicine for primary osteoarthritis. Injection.  04/09/24: CMP, Vit D, PTH (will ask nephrology about this value), A1C, CBC/diff     Outpatient Encounter Medications as of 05/22/2024  Medication Sig   cyclobenzaprine  (FLEXERIL ) 5 MG tablet TAKE 1 TABLET BY MOUTH AT BEDTIME AS NEEDED FOR MUSCLE SPASM   famotidine  (PEPCID ) 20 MG tablet daily.   gabapentin  (NEURONTIN ) 300 MG capsule One tab PO qHS for a week, then BID for a week, then TID. May double weekly to a max of 3,600mg /day   loratadine  (CLARITIN ) 10 MG tablet Take 10 mg by mouth daily.   MAGNESIUM  PO Take by mouth.   metFORMIN (GLUCOPHAGE) 500 MG tablet Take 500 mg by mouth daily with breakfast.    Multiple Vitamin (MULTIVITAMIN ADULT PO) Take by mouth.   mycophenolate  (MYFORTIC ) 180 MG EC tablet TAKE 2 TABLETS BY MOUTH 2 TIMES DAILY.   ondansetron  (ZOFRAN -ODT) 8 MG disintegrating tablet Take 1 tablet (8 mg total) by mouth every 8 (eight) hours as needed for nausea.   sotalol  (BETAPACE ) 80 MG tablet Take 80 mg by mouth 2 (two) times daily.    sulfamethoxazole -trimethoprim  (BACTRIM ) 400-80  MG tablet Take 1 tablet by mouth. M,W,F   tacrolimus  (PROGRAF ) 1 MG capsule Take 5 mg in the AM and 4 mg in the PM by mouth daily   traMADol  (ULTRAM ) 50 MG tablet Take 1 tablet (50 mg total) by mouth every 8 (eight) hours as needed for moderate pain (pain score 4-6).   acetaminophen  (TYLENOL ) 500 MG tablet Take 1,000 mg by mouth every 6 (six) hours as needed for fever (pain).   aspirin  EC 81 MG tablet Take 81 mg by mouth daily.   atorvastatin  (LIPITOR ) 40 MG tablet Take 40 mg by mouth every Monday, Wednesday, and Friday.    venlafaxine  XR (EFFEXOR -XR) 37.5 MG 24 hr capsule TAKE 1 CAPSULE BY MOUTH EVERY OTHER DAY (Patient taking differently: TAKE 1 CAPSULE BY MOUTH EVERY THREE DAYs)   [DISCONTINUED] predniSONE  (STERAPRED UNI-PAK 21 TAB) 10 MG (21) TBPK tablet Take by mouth daily. Take 6 tabs by mouth daily  for 2 days, then 5 tabs for 2 days, then 4 tabs for 2 days, then 3 tabs for 2 days, 2 tabs for 2 days, then 1 tab by mouth daily for 2 days (Patient not taking: Reported on 04/16/2024)   No facility-administered encounter medications on file as of 05/22/2024.    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)    Pneumonia    03/08/17- many years ago   Polycystic kidney disease 11/2016   transplant    PONV (postoperative nausea and vomiting)    Post-transplant diabetes mellitus (HCC)    Pre-diabetes    due to antirejection medication   Ruptured cyst of kidney    SDH (subdural hematoma) (HCC)     Past Surgical History:  Procedure Laterality Date   BASCILIC VEIN TRANSPOSITION Left 09/07/2016   Procedure: BASILIC VEIN  TRANSPOSITION LEFT UPPER ARM;  Surgeon: Carlin FORBES Haddock, MD;  Location: [facility] OR;  Service: Vascular;  Laterality: Left;   COLONOSCOPY  03/2004   repeat 10 years   Evacuation of subdural hematoma  11/26/2000   KIDNEY STONE SURGERY     Cysto   KIDNEY TRANSPLANT     Nov 23, 2016   LEFT ATRIAL APPENDAGE OCCLUSION  06/03/2017   Dr. Dasie    LIGATION OF ARTERIOVENOUS  FISTULA Left 03/09/2017   Procedure: LIGATION OF LEFT BASILIC VEIN TRANSPOSITION;  Surgeon: Haddock Carlin FORBES, MD;  Location: [facility] OR;  Service: Vascular;  Laterality: Left;   NEPHRECTOMY  08/03/2021   ORIF ELBOW FRACTURE  10/22/2011   Procedure: OPEN REDUCTION INTERNAL FIXATION (ORIF) ELBOW/OLECRANON FRACTURE;  Surgeon: Donnice DELENA Robinsons, MD;  Location: [facility];  Service: Orthopedics;  Laterality: Left;  open reduction  possible lateral reconstruction with palmaris graft from left or right side.   RADIAL HEAD IMPLANT  09/24/2011   Procedure: RADIAL HEAD IMPLANT;  Surgeon: Donnice DELENA Robinsons, MD;  Location: [facility];  Service: Orthopedics;  Laterality: Left;  left radial head replacement   REPLACEMENT TOTAL KNEE  12/13/2020   Dr. Liam   TONSILLECTOMY      Family History  Problem Relation Age of Onset   Polycystic kidney disease Father    Heart failure Father    Heart disease Mother        Rheumatic fever    Social History   Socioeconomic History   Marital status: Married    Spouse name: Mariah [name]   Number of children: 0   Years of education: 12   Highest education level: 12th grade  Occupational History   Occupation: Heritage manager: [employer]   Occupation: Network engineer    Comment: part-time  Tobacco Use   Smoking status: Former    Current packs/day: 0.00    Average packs/day: 2.0 packs/day for 24.0 years (48.0 ttl pk-yrs)    Types: Cigarettes    Start date: 12/12/1972    Quit date: 12/12/1996    Years since quitting: 27.4   Smokeless tobacco: Never  Vaping Use    Vaping status: Never Used  Substance and Sexual Activity   Alcohol use: Yes    Alcohol/week: 2.0 standard drinks of alcohol    Types: 2 Glasses of wine per week    Comment: 1 drink a week   Drug use: No   Sexual activity: Yes    Partners: Female  Other Topics Concern   Not on file  Social History Narrative   Lives with her wife. They have one dog. She enjoys fishing and walking.  Social Drivers of Corporate investment banker Strain: Low Risk  (04/11/2024)   Overall Financial Resource Strain (CARDIA)    Difficulty of Paying Living Expenses: Not very hard  Food Insecurity: Low Risk  (04/18/2024)   Received from [facility]   Hunger Vital Sign    Within the past 12 months, you worried that your food would run out before you got money to buy more: Never true    Within the past 12 months, the food you bought just didn't last and you didn't have money to get more. : Never true  Transportation Needs: No Transportation Needs (04/18/2024)   Received from [employer]    In the past 12 months, has lack of reliable transportation kept you from medical appointments, meetings, work or from getting things needed for daily living? : No  Physical Activity: Insufficiently Active (04/11/2024)   Exercise Vital Sign    Days of Exercise per Week: 3 days    Minutes of Exercise per Session: 40 min  Stress: No Stress Concern Present (04/11/2024)   [employer] of Occupational Health - Occupational Stress Questionnaire    Feeling of Stress: Only a little  Social Connections: Moderately Isolated (04/11/2024)   Social Connection and Isolation Panel    Frequency of Communication with Friends and Family: More than three times a week    Frequency of Social Gatherings with Friends and Family: Twice a week    Attends Religious Services: Never    Database administrator or Organizations: No    Attends Engineer, structural: Not on file    Marital Status: Married  Catering manager  Violence: Not At Risk (01/06/2023)   Humiliation, Afraid, Rape, and Kick questionnaire    Fear of Current or Ex-Partner: No    Emotionally Abused: No    Physically Abused: No    Sexually Abused: No    ROS      Objective    BP (!) 124/53 (BP Location: Left Arm, Patient Position: Sitting, Cuff Size: Large)   Pulse 63   Temp 98.2 F (36.8 C) (Oral)   Ht 5' 2 (1.575 m)   Wt 198 lb (89.8 kg)   LMP 07/11/2009 (Approximate)   SpO2 99%   BMI 36.21 kg/m   Physical Exam Vitals and nursing note reviewed.  Constitutional:      General: She is not in acute distress.    Appearance: Normal appearance.  Cardiovascular:     Rate and Rhythm: Normal rate and regular rhythm.     Heart sounds: Normal heart sounds.  Pulmonary:     Effort: Pulmonary effort is normal.     Breath sounds: Normal breath sounds.  Skin:    General: Skin is warm and dry.  Neurological:     General: No focal deficit present.     Mental Status: She is alert. Mental status is at baseline.  Psychiatric:        Mood and Affect: Mood normal.        Behavior: Behavior normal.        Thought Content: Thought content normal.        Judgment: Judgment normal.         Assessment & Plan:   Problem List Items Addressed This Visit     Muscle spasm   Taking Flexeril  5 mg as needed at night for spasms. Symptoms are well controlled.  No refills needed.      Establishing care with new doctor,  encounter for - Primary  Agrees with plan of care discussed.  Questions answered.   Return in about 4 weeks (around 06/19/2024) for Medicare Wellness on phone .   Darice JONELLE Brownie, FNP

## 2024-05-22 NOTE — Assessment & Plan Note (Signed)
 Taking Flexeril  5 mg as needed at night for spasms. Symptoms are well controlled.  No refills needed.

## 2024-05-24 ENCOUNTER — Encounter: Payer: Self-pay | Admitting: Physician Assistant

## 2024-05-24 ENCOUNTER — Ambulatory Visit: Admitting: Sports Medicine

## 2024-05-29 ENCOUNTER — Encounter: Payer: Self-pay | Admitting: Family Medicine

## 2024-05-29 ENCOUNTER — Other Ambulatory Visit: Payer: Self-pay | Admitting: Family Medicine

## 2024-05-29 DIAGNOSIS — M62838 Other muscle spasm: Secondary | ICD-10-CM

## 2024-05-29 MED ORDER — CYCLOBENZAPRINE HCL 5 MG PO TABS: ORAL_TABLET | ORAL | 1 refills | Status: DC

## 2024-05-30 ENCOUNTER — Ambulatory Visit (HOSPITAL_BASED_OUTPATIENT_CLINIC_OR_DEPARTMENT_OTHER): Admitting: Obstetrics & Gynecology

## 2024-06-12 ENCOUNTER — Encounter: Payer: Self-pay | Admitting: Sports Medicine

## 2024-06-13 ENCOUNTER — Other Ambulatory Visit: Payer: Self-pay | Admitting: Obstetrics & Gynecology

## 2024-06-13 DIAGNOSIS — Z1231 Encounter for screening mammogram for malignant neoplasm of breast: Secondary | ICD-10-CM

## 2024-06-19 ENCOUNTER — Ambulatory Visit: Admitting: Family Medicine

## 2024-06-20 DIAGNOSIS — Z95818 Presence of other cardiac implants and grafts: Secondary | ICD-10-CM | POA: Diagnosis not present

## 2024-06-20 DIAGNOSIS — Z79899 Other long term (current) drug therapy: Secondary | ICD-10-CM | POA: Diagnosis not present

## 2024-06-20 DIAGNOSIS — I480 Paroxysmal atrial fibrillation: Secondary | ICD-10-CM | POA: Diagnosis not present

## 2024-06-20 DIAGNOSIS — I499 Cardiac arrhythmia, unspecified: Secondary | ICD-10-CM | POA: Diagnosis not present

## 2024-06-20 DIAGNOSIS — Z5181 Encounter for therapeutic drug level monitoring: Secondary | ICD-10-CM | POA: Diagnosis not present

## 2024-06-21 DIAGNOSIS — I499 Cardiac arrhythmia, unspecified: Secondary | ICD-10-CM | POA: Diagnosis not present

## 2024-06-25 ENCOUNTER — Ambulatory Visit (INDEPENDENT_AMBULATORY_CARE_PROVIDER_SITE_OTHER): Admitting: Family Medicine

## 2024-06-25 ENCOUNTER — Encounter: Payer: Self-pay | Admitting: Family Medicine

## 2024-06-25 VITALS — BP 122/75 | HR 71 | Ht 62.0 in | Wt 197.4 lb

## 2024-06-25 DIAGNOSIS — Z0000 Encounter for general adult medical examination without abnormal findings: Secondary | ICD-10-CM

## 2024-06-25 NOTE — Progress Notes (Addendum)
 Subjective:   Mariah [name] is a 68 y.o. female who presents for Medicare Annual (Subsequent) preventive examination.  Visit Complete: Virtual I connected with  Mariah [name] on 06/25/24 by a audio enabled telemedicine application and verified that I am speaking with the correct person using two identifiers.  Patient Location: Home  Provider Location: Office/Clinic  I discussed the limitations of evaluation and management by telemedicine. The patient expressed understanding and agreed to proceed.  Vital Signs: Because this visit was a virtual/telehealth visit, some criteria may be missing or patient reported. Any vitals not documented were not able to be obtained and vitals that have been documented are patient reported.  Patient Medicare AWV questionnaire was completed by the patient on 06/21/24; I have confirmed that all information answered by patient is correct and no changes since this date.  Cardiac Risk Factors include: advanced age (>37men, >36 women);obesity (BMI >30kg/m2)     Objective:    Today's Vitals   06/25/24 1329  BP: 122/75  Pulse: 71  Weight: 197 lb 6.4 oz (89.5 kg)  Height: 5' 2 (1.575 m)   Body mass index is 36.1 kg/m.     06/25/2024    1:46 PM 04/16/2024    9:56 AM 01/06/2023    2:03 PM 04/28/2021   11:22 AM 06/17/2018    4:38 PM 04/21/2017    3:40 PM 02/24/2017    3:12 PM  Advanced Directives  Does Patient Have a Medical Advance Directive? Yes Yes Yes Yes Yes  Yes  Yes   Type of Estate agent of [place];Living will Healthcare Power of [place];Living will Living will Healthcare Power of [place];Living will  Healthcare Power of [place];Living will Healthcare Power of [place];Living will  Does patient want to make changes to medical advance directive?   No - Patient declined No - Patient declined     Copy of Healthcare Power of Attorney in Chart?    Yes - validated most recent copy scanned in chart (See row information)         Data saved with a previous flowsheet row definition     Current Medications (verified) Outpatient Encounter Medications as of 06/25/2024  Medication Sig   acetaminophen  (TYLENOL ) 500 MG tablet Take 1,000 mg by mouth every 6 (six) hours as needed for fever (pain).   aspirin  EC 81 MG tablet Take 81 mg by mouth daily.   atorvastatin  (LIPITOR ) 40 MG tablet Take 40 mg by mouth every Monday, Wednesday, and Friday.    cyclobenzaprine  (FLEXERIL ) 5 MG tablet TAKE 1 TABLET BY MOUTH AT BEDTIME AS NEEDED FOR MUSCLE SPASM   famotidine  (PEPCID ) 20 MG tablet daily.   gabapentin  (NEURONTIN ) 300 MG capsule One tab PO qHS for a week, then BID for a week, then TID. May double weekly to a max of 3,600mg /day (Patient taking differently: Take 300 mg by mouth daily. One tab PO qHS for a week, then BID for a week, then TID. May double weekly to a max of 3,600mg /day Weaning self off)   loratadine  (CLARITIN ) 10 MG tablet Take 10 mg by mouth daily.   MAGNESIUM  PO Take by mouth.   metFORMIN (GLUCOPHAGE) 500 MG tablet Take 500 mg by mouth daily with breakfast.  (Patient taking differently: Take 500 mg by mouth at bedtime.)   Multiple Vitamin (MULTIVITAMIN ADULT PO) Take by mouth.   mycophenolate  (MYFORTIC ) 180 MG EC tablet TAKE 2 TABLETS BY MOUTH 2 TIMES DAILY.   ondansetron  (ZOFRAN -ODT) 8 MG disintegrating tablet  Take 1 tablet (8 mg total) by mouth every 8 (eight) hours as needed for nausea.   sotalol  (BETAPACE ) 80 MG tablet Take 80 mg by mouth 2 (two) times daily.    sulfamethoxazole -trimethoprim  (BACTRIM ) 400-80 MG tablet Take 1 tablet by mouth. M,W,F   tacrolimus  (PROGRAF ) 1 MG capsule Take 5 mg in the AM and 4 mg in the PM by mouth daily   [DISCONTINUED] traMADol  (ULTRAM ) 50 MG tablet Take 1 tablet (50 mg total) by mouth every 8 (eight) hours as needed for moderate pain (pain score 4-6).   No facility-administered encounter medications on file as of 06/25/2024.    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)    Pneumonia    03/08/17- many years ago   Polycystic kidney disease 11/2016   transplant    PONV (postoperative nausea and vomiting)    Post-transplant diabetes mellitus (HCC)    Pre-diabetes    due to antirejection medication   Ruptured cyst of kidney    SDH (subdural hematoma) (HCC)    Past Surgical History:  Procedure Laterality Date   BASCILIC VEIN TRANSPOSITION Left 09/07/2016   Procedure: BASILIC VEIN TRANSPOSITION LEFT UPPER ARM;  Surgeon: Carlin FORBES Haddock, MD;  [facility]: [facility];  Service: Vascular;  Laterality: Left;   BRAIN SURGERY  2001   Sub dural hematoma   COLONOSCOPY  03/2004   repeat 10 years   Evacuation of subdural hematoma  11/26/2000   JOINT REPLACEMENT  2023   Right knee   KIDNEY STONE SURGERY     Cysto   KIDNEY TRANSPLANT     Nov 23, 2016   LEFT ATRIAL APPENDAGE OCCLUSION  06/03/2017   Dr. Dasie    LIGATION OF ARTERIOVENOUS  FISTULA Left 03/09/2017   Procedure: LIGATION OF LEFT BASILIC VEIN TRANSPOSITION;  Surgeon: Haddock Carlin FORBES, MD;  Location: [facility] OR;  Service: Vascular;  Laterality: Left;   NEPHRECTOMY  08/03/2021   ORIF ELBOW FRACTURE  10/22/2011   Procedure: OPEN REDUCTION INTERNAL FIXATION (ORIF) ELBOW/OLECRANON FRACTURE;  Surgeon: Donnice DELENA Robinsons, MD;  Location: [facility];  Service: Orthopedics;  Laterality: Left;  open reduction  possible lateral  reconstruction with palmaris graft from left or right side.   RADIAL HEAD IMPLANT  09/24/2011   Procedure: RADIAL HEAD IMPLANT;  Surgeon: Donnice DELENA Robinsons, MD;  Location: [facility];  Service: Orthopedics;  Laterality: Left;  left radial head replacement   REPLACEMENT TOTAL KNEE  12/13/2020   Dr. Liam   TONSILLECTOMY     Family History  Problem Relation Age of Onset   Polycystic kidney disease Father    Heart failure Father    Kidney disease Father    Heart disease Mother        Rheumatic fever   Social History   Socioeconomic History  Marital status: Married    Spouse name: Sharlet   Number of children: 0   Years of education: 12   Highest education level: 12th grade  Occupational History   Occupation: Heritage manager: [employer]   Occupation: Network engineer    Comment: part-time  Tobacco Use   Smoking status: Former    Current packs/day: 0.00    Average packs/day: 2.0 packs/day for 24.0 years (48.0 ttl pk-yrs)    Types: Cigarettes    Start date: 12/12/1972    Quit date: 12/12/1996    Years since quitting: 27.5   Smokeless tobacco: Never  Vaping Use   Vaping status: Never Used  Substance and Sexual Activity   Alcohol use: Yes    Alcohol/week: 2.0 standard drinks of alcohol    Types: 2 Glasses of wine per week    Comment: 1 drink a week   Drug use: No   Sexual activity: Yes    Partners: Female    Birth control/protection: None  Other Topics Concern   Not on file  Social History Narrative   Lives with her wife. They have one dog. She enjoys fishing and walking.   Social Drivers of Corporate investment banker Strain: Low Risk  (06/25/2024)   Overall Financial Resource Strain (CARDIA)    Difficulty of Paying Living Expenses: Not hard at all  Food Insecurity: No Food Insecurity (06/25/2024)   Hunger Vital Sign    Worried About Running Out of Food in the Last Year: Never true    Ran Out of Food in the Last Year: Never true  Transportation  Needs: No Transportation Needs (06/25/2024)   PRAPARE - Administrator, Civil Service (Medical): No    Lack of Transportation (Non-Medical): No  Physical Activity: Insufficiently Active (06/25/2024)   Exercise Vital Sign    Days of Exercise per Week: 3 days    Minutes of Exercise per Session: 30 min  Stress: No Stress Concern Present (06/25/2024)   [employer] of Occupational Health - Occupational Stress Questionnaire    Feeling of Stress: Not at all  Social Connections: Moderately Isolated (06/25/2024)   Social Connection and Isolation Panel    Frequency of Communication with Friends and Family: More than three times a week    Frequency of Social Gatherings with Friends and Family: Three times a week    Attends Religious Services: Never    Active Member of Clubs or Organizations: No    Attends Engineer, structural: Never    Marital Status: Married    Tobacco Counseling Counseling given: Not Answered   Clinical Intake:  Pre-visit preparation completed: No  Pain : No/denies pain     BMI - recorded: 36.1 Nutritional Status: BMI > 30  Obese Nutritional Risks: None Diabetes: No  How often do you need to have someone help you when you read instructions, pamphlets, or other written materials from your doctor or pharmacy?: 1 - Never What is the last grade level you completed in school?: 12  Interpreter Needed?: No      Activities of Daily Living    06/25/2024    1:41 PM 06/21/2024    9:16 AM  In your present state of health, do you have any difficulty performing the following activities:  Hearing? 0 0  Vision? 0 0  Difficulty concentrating or making decisions? 0 0  Walking or climbing stairs? 0 0  Dressing or bathing? 0 0  Doing errands, shopping? 0 0  Preparing Food and eating ? N N  Using the Toilet? N N  In the past six months, have you accidently leaked urine? N N  Do you have problems with loss of bowel control? N N  Managing your  Medications? N N  Managing your Finances? N N  Housekeeping or managing your Housekeeping? N N    Patient Care Team: Booker Darice SAUNDERS, FNP as PCP - General (Family Medicine) Peggi SQUIBB, MD cards Alyne, MD, graeme Cleotilde EDISON MD, GYN Transplant team, [facility]  Venancio, MD derm Ang, MD, rheum   Indicate any recent Medical Services you may have received from other than [facility] providers in the past year (date may be approximate).     Assessment:   This is a routine wellness examination for [place].  Hearing/Vision screen Hearing Screening - Comments:: Grossly intact Vision Screening - Comments:: Cataracts, no issues currently   Goals Addressed             This Visit's Progress    Exercise 150 min/wk Moderate Activity         Depression Screen    06/25/2024    1:39 PM 05/22/2024    1:25 PM 09/14/2023   12:41 PM 05/06/2023   10:16 AM 01/06/2023    2:04 PM 10/22/2022   11:36 AM 04/23/2022   10:19 AM  PHQ 2/9 Scores  PHQ - 2 Score 0 0 0 0 0 0 0  PHQ- 9 Score 0 0 0   0     Fall Risk    06/25/2024    1:47 PM 06/21/2024    9:16 AM 05/22/2024    1:25 PM 01/06/2023    2:03 PM 02/02/2022   11:00 AM  Fall Risk   Falls in the past year? 0 1 0 1 0  Number falls in past yr: 0 0 0 0 0  Injury with Fall? 0 0 0 0 0  Risk for fall due to : No Fall Risks   History of fall(s) No Fall Risks  Follow up Falls evaluation completed  Falls evaluation completed Falls evaluation completed;Education provided;Falls prevention discussed Falls evaluation completed      Data saved with a previous flowsheet row definition     MEDICARE RISK AT HOME: Medicare Risk at Home Any stairs in or around the home?: No If so, are there any without handrails?: No Home free of loose throw rugs in walkways, pet beds, electrical cords, etc?: No Adequate lighting in your home to reduce risk of falls?: Yes Life alert?: No Use of a cane, walker or w/c?: No Grab bars in the bathroom?: No Shower chair or bench in  shower?: No Elevated toilet seat or a handicapped toilet?: No  TIMED UP AND GO:  Was the test performed?  No    Cognitive Function:        06/25/2024    1:48 PM 01/06/2023    2:11 PM 04/28/2021   11:36 AM  6CIT Screen  What Year? 0 points 0 points 0 points  What month? 0 points 0 points 0 points  What time? 0 points 0 points 0 points  Count back from 20 0 points 0 points 0 points  Months in reverse 0 points 0 points 0 points  Repeat phrase 0 points 0 points 0 points  Total Score 0 points 0 points 0 points    Immunizations Immunization History  Administered Date(s) Administered   DTaP 07/27/2011   Fluad Quad(high Dose 65+) 06/23/2022   Fluzone  Influenza virus vaccine,trivalent (IIV3), split virus 09/12/2004   Hepatitis B 04/29/2016, 06/01/2016, 11/01/2016   Hepatitis B, ADULT 04/29/2016, 06/01/2016, 11/01/2016   Hepatitis B, PED/ADOLESCENT 04/29/2016, 06/01/2016, 11/01/2016   INFLUENZA, HIGH DOSE SEASONAL PF 06/27/2023   Influenza Split 09/12/2006, 09/19/2007, 10/13/2012   Influenza Whole 09/12/2006, 09/19/2007   Influenza, Quadrivalent, Recombinant, Inj, Pf 06/15/2017   Influenza,inj,Quad PF,6+ Mos 07/05/2013, 06/26/2014, 06/02/2015, 06/01/2016, 07/13/2018, 06/04/2019, 06/30/2020, 06/16/2021   Influenza-Unspecified 07/05/2013, 06/26/2014, 06/02/2015, 06/01/2016, 06/15/2017, 07/13/2018, 06/04/2019   Janssen (J&J) SARS-COV-2 Vaccination 12/14/2019   Moderna Covid-19 Fall Seasonal Vaccine 44yrs & older 06/27/2023   Moderna Sars-Covid-2 Vaccination 08/06/2020, 11/26/2020, 05/02/2021   PPD Test 04/12/2016   Pfizer Covid-19 Vaccine Bivalent Booster 32yrs & up 07/05/2022   Pfizer(Comirnaty)Fall Seasonal Vaccine 12 years and older 06/27/2023   Pneumococcal Conjugate-13 12/20/2017   Pneumococcal Polysaccharide-23 04/29/2016   Tdap 05/02/2021   Zoster Recombinant(Shingrix) 10/20/2021, 04/10/2022   Zoster, Live 12/26/2012    TDAP status: Up to date  Flu Vaccine status: Due,  Education has been provided regarding the importance of this vaccine. Advised may receive this vaccine at local pharmacy or Health Dept. Aware to provide a copy of the vaccination record if obtained from local pharmacy or Health Dept. Verbalized acceptance and understanding.  Pneumococcal vaccine status: Up to date  Covid-19 vaccine status: Information provided on how to obtain vaccines.   Qualifies for Shingles Vaccine? Yes   Zostavax completed Yes   Shingrix Completed?: Yes  Screening Tests Health Maintenance  Topic Date Due   Influenza Vaccine  05/11/2024   COVID-19 Vaccine (7 - 2024-25 season) 06/11/2024   Mammogram  05/24/2024   Pneumococcal Vaccine: 50+ Years (3 of 3 - PCV20 or PCV21) 09/13/2024 (Originally 04/29/2021)   Medicare Annual Wellness (AWV)  06/25/2025   Colonoscopy  12/09/2027   DTaP/Tdap/Td (3 - Td or Tdap) 05/03/2031   DEXA SCAN  Completed   Hepatitis C Screening  Completed   Zoster Vaccines- Shingrix  Completed   HPV VACCINES  Aged Out   Meningococcal B Vaccine  Aged Out   Hepatitis B Vaccines 19-59 Average Risk  Discontinued    Health Maintenance  Health Maintenance Due  Topic Date Due   Influenza Vaccine  05/11/2024   COVID-19 Vaccine (7 - 2024-25 season) 06/11/2024   Mammogram  05/24/2024    Colorectal cancer screening: Type of screening: Colonoscopy. Completed 11/23/2022. Repeat every 5 years  Mammogram status: Completed 08/03/2023. Repeat every year  Bone Density status: Completed 05/04/23. Results reflect: Bone density results: OSTEOPOROSIS. Repeat every 2 years.  Lung Cancer Screening: (Low Dose CT Chest recommended if Age 31-80 years, 20 pack-year currently smoking OR have quit w/in 15years.) does not qualify.   Lung Cancer Screening Referral: n/a   Additional Screening:  Hepatitis C Screening: does qualify; Completed before transplant.   Vision Screening: Recommended annual ophthalmology exams for early detection of glaucoma and other  disorders of the eye. Is the patient up to date with their annual eye exam?  Yes  Who is the provider or what is the name of the office in which the patient attends annual eye exams? [facility], Dr. Ane.  If pt is not established with a provider, would they like to be referred to a provider to establish care? No .   Dental Screening: Recommended annual dental exams for proper oral hygiene  Diabetic Foot Exam: n/a   Community Resource Referral / Chronic Care Management: CRR required this visit?  No   CCM required this visit?  No     Plan:     I have personally reviewed and noted the following in the patient's chart:   Medical and social history Use of alcohol, tobacco or illicit drugs  Current medications and supplements including opioid prescriptions. Patient is not currently taking opioid prescriptions. Functional ability and status Nutritional status Physical activity plans to increase exercise to 150 minutes per week.  Advanced directives List of other physicians Hospitalizations, surgeries, and ER visits in previous 12 months: no  Vitals Screenings to include cognitive, depression, and falls Referrals and appointments: none   In addition, I have reviewed and discussed with patient certain preventive protocols, quality metrics, and best practice recommendations. A written personalized care plan for preventive services as well as general preventive health recommendations were provided to patient.     Darice JONELLE Brownie, FNP   06/25/2024   After Visit Summary: (MyChart) Due to this being a telephonic visit, the after visit summary with patients personalized plan was offered to patient via MyChart   Influenza and Covid at local pharmacy.

## 2024-07-24 ENCOUNTER — Encounter (HOSPITAL_BASED_OUTPATIENT_CLINIC_OR_DEPARTMENT_OTHER): Payer: Self-pay | Admitting: Obstetrics & Gynecology

## 2024-07-24 ENCOUNTER — Ambulatory Visit (INDEPENDENT_AMBULATORY_CARE_PROVIDER_SITE_OTHER): Admitting: Obstetrics & Gynecology

## 2024-07-24 VITALS — BP 123/73 | HR 73 | Wt 201.4 lb

## 2024-07-24 DIAGNOSIS — Z940 Kidney transplant status: Secondary | ICD-10-CM | POA: Diagnosis not present

## 2024-07-24 DIAGNOSIS — N952 Postmenopausal atrophic vaginitis: Secondary | ICD-10-CM

## 2024-07-24 DIAGNOSIS — M25512 Pain in left shoulder: Secondary | ICD-10-CM | POA: Diagnosis not present

## 2024-07-24 DIAGNOSIS — Z780 Asymptomatic menopausal state: Secondary | ICD-10-CM | POA: Diagnosis not present

## 2024-07-24 DIAGNOSIS — Z01419 Encounter for gynecological examination (general) (routine) without abnormal findings: Secondary | ICD-10-CM

## 2024-07-24 DIAGNOSIS — M7542 Impingement syndrome of left shoulder: Secondary | ICD-10-CM | POA: Diagnosis not present

## 2024-07-24 DIAGNOSIS — R7303 Prediabetes: Secondary | ICD-10-CM | POA: Insufficient documentation

## 2024-07-24 DIAGNOSIS — D849 Immunodeficiency, unspecified: Secondary | ICD-10-CM

## 2024-07-24 NOTE — Progress Notes (Signed)
 Breast and Pelvic Exam Patient name: Mariah [name] MRN [id number]  Date of birth: 1956-10-08 Chief Complaint:   Gynecologic Exam (Pt reports no issues or concerns today. )  History of Present Illness:   Mariah [name] is a 68 y.o. G0P0000 Caucasian female being seen today for a breast and pelvic exam.  Has no complaints today.  Denies vaginal bleeding.   Doing well.  Seeing Dr. Issac, nephrologist, every 6 months right now.  Last creatinine was 1.24.    Patient's last menstrual period was 07/11/2009 (approximate).   Last pap 04/23/2022. Results were: NILM w/ HRHPV negative. H/O abnormal pap: no Last mammogram: 05/25/2023. Results were: normal. Patient scheduled for next mammogram on 08/02/2024. Family h/o breast cancer: no Last colonoscopy: 12/09/2022. Results were: abnormal 1 polyp. Family h/o colorectal cancer: no DEXA:  05/04/2023.  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     07/24/2024   10:39 AM 06/25/2024    1:39 PM 05/22/2024    1:25 PM 09/14/2023   12:41 PM 05/06/2023   10:16 AM  Depression screen PHQ 2/9  Decreased Interest 0 0 0 0 0  Down, Depressed, Hopeless 0 0 0 0 0  PHQ - 2 Score 0 0 0 0 0  Altered sleeping  0 0 0   Tired, decreased energy  0 0 0   Change in appetite  0 0 0   Feeling bad or failure about yourself   0 0 0   Trouble concentrating  0 0 0   Moving slowly or fidgety/restless  0 0 0   Suicidal thoughts  0 0 0   PHQ-9 Score  0 0 0   Difficult doing work/chores  Not difficult at all Not difficult at all Not difficult at all         05/22/2024    1:25 PM 09/14/2023   12:41 PM 10/22/2022   11:36 AM 11/17/2020    1:32 PM  GAD 7 : Generalized Anxiety Score  Nervous, Anxious, on Edge 0 0 0 0  Control/stop worrying 0 0 0 0  Worry too much - different things 0 0 0 0  Trouble relaxing 0 0 0 0  Restless 0 0 0 0  Easily annoyed or irritable 0 0 0 0  Afraid - awful might happen 0 0 0 0  Total GAD 7 Score 0 0 0 0  Anxiety Difficulty Not difficult at all Not difficult at all  Not difficult at all Not difficult at all     Review of Systems:   Pertinent items are noted in HPI Denies any urinary or bowel changes.  Denies pelvic pain.   Pertinent History Reviewed:  Reviewed past medical,surgical, social and family history.  Reviewed problem list, medications and allergies. Physical Assessment:   Vitals:   07/24/24 1034  BP: 123/73  Pulse: 73  SpO2: 100%  Weight: 201 lb 6.4 oz (91.4 kg)  Body mass index is 36.84 kg/m.        Physical Examination:   General appearance - well appearing, and in no distress  Mental status - alert, oriented to person, place, and time  Psych:  She has a normal mood and affect  Skin - warm and dry, normal color, no suspicious lesions noted  Chest - effort normal, all lung fields clear to auscultation bilaterally  Heart - normal rate and regular rhythm  Neck:  midline trachea, no thyromegaly or nodules  Breasts - breasts appear normal, no suspicious masses, no skin or nipple changes  or  axillary nodes  Abdomen - soft, nontender, nondistended, no masses or organomegaly  Pelvic - VULVA: normal appearing vulva with no masses, tenderness or lesions   VAGINA: normal appearing vagina with normal color and discharge, no lesions   CERVIX: normal appearing cervix without discharge or lesions, no CMT  Thin prep pap is not done today  UTERUS: uterus is felt to be normal size, shape, consistency and nontender   ADNEXA: No adnexal masses or tenderness noted.  Rectal - deferred  Extremities:  No swelling or varicosities noted  Chaperone present for exam  No results found for this or any previous visit (from the past 24 hours).  Assessment & Plan:  1. Encntr for gyn exam (general) (routine) w/o abn findings (Primary) - Pap smear 2023.  Plan to repeat next year.   - Mammogram 05/25/2023.  Scheduled for next week. - Colonoscopy 12/09/2022 - Bone mineral density 04/2023 - vaccines reviewed/updated  2. Postmenopausal - not on HRT  3.  History of renal transplant - followed by Dr. Issac  4. Vaginal atrophy  5. Immunosuppression   No orders of the defined types were placed in this encounter.   Meds: No orders of the defined types were placed in this encounter.   Follow-up: Return in about 1 year (around 07/24/2025).  Ronal GORMAN Pinal, MD 07/24/2024 11:25 AM

## 2024-08-02 ENCOUNTER — Ambulatory Visit

## 2024-08-02 DIAGNOSIS — Z1231 Encounter for screening mammogram for malignant neoplasm of breast: Secondary | ICD-10-CM

## 2024-08-08 ENCOUNTER — Ambulatory Visit (INDEPENDENT_AMBULATORY_CARE_PROVIDER_SITE_OTHER): Admitting: Family Medicine

## 2024-08-08 ENCOUNTER — Encounter: Payer: Self-pay | Admitting: Family Medicine

## 2024-08-08 VITALS — BP 124/74 | HR 69 | Temp 97.8°F | Ht 62.0 in

## 2024-08-08 DIAGNOSIS — F439 Reaction to severe stress, unspecified: Secondary | ICD-10-CM | POA: Insufficient documentation

## 2024-08-08 MED ORDER — VENLAFAXINE HCL ER 37.5 MG PO CP24: 37.5000 mg | ORAL_CAPSULE | Freq: Every day | ORAL | 1 refills | Status: DC

## 2024-08-08 NOTE — Progress Notes (Signed)
 Acute Office Visit  Subjective:     Patient ID: Mariah [name], female    DOB: [birth year], 68 y.o.   MRN: [id number]  Chief Complaint  Patient presents with   Headache    Stress headaches most of it is wife Onset- past 6 weeks - on and off - when she made the appointment very bad aggravating - over the counter Tylenol     HPI Patient is in today for increased stress which is  causing headaches. Taking Tylenol  for headaches. Describes headaches are bilateral sided of head. Denies neurological symptoms associated with headaches.  Wants to get back on Effexor  to manage stress better.   ROS      Objective:    BP 124/74 (BP Location: Right Arm, Patient Position: Sitting, Cuff Size: Normal)   Pulse 69   Temp 97.8 F (36.6 C) (Oral)   Ht 5' 2 (1.575 m)   LMP 07/11/2009 (Approximate)   SpO2 100%   BMI 36.84 kg/m    Physical Exam Vitals and nursing note reviewed.  Constitutional:      General: She is not in acute distress.    Appearance: She is well-developed.  Cardiovascular:     Rate and Rhythm: Normal rate and regular rhythm.     Heart sounds: Normal heart sounds.  Pulmonary:     Effort: Pulmonary effort is normal.     Breath sounds: Normal breath sounds.  Skin:    General: Skin is warm and dry.  Neurological:     General: No focal deficit present.     Mental Status: She is alert. Mental status is at baseline.  Psychiatric:        Mood and Affect: Mood normal.        Behavior: Behavior normal.        Thought Content: Thought content normal.        Judgment: Judgment normal.       08/08/2024    3:18 PM 07/24/2024   10:39 AM 06/25/2024    1:39 PM 05/22/2024    1:25 PM 09/14/2023   12:41 PM  Depression screen PHQ 2/9  Decreased Interest 0 0 0 0 0  Down, Depressed, Hopeless 0 0 0 0 0  PHQ - 2 Score 0 0 0 0 0  Altered sleeping 0  0 0 0  Tired, decreased energy 0  0 0 0  Change in appetite 0  0 0 0  Feeling bad or failure about yourself  0  0 0 0   Trouble concentrating 0  0 0 0  Moving slowly or fidgety/restless 0  0 0 0  Suicidal thoughts 0  0 0 0  PHQ-9 Score 0  0 0 0  Difficult doing work/chores Not difficult at all  Not difficult at all Not difficult at all Not difficult at all      08/08/2024    3:19 PM 05/22/2024    1:25 PM 09/14/2023   12:41 PM 10/22/2022   11:36 AM  GAD 7 : Generalized Anxiety Score  Nervous, Anxious, on Edge 1 0 0 0  Control/stop worrying 0 0 0 0  Worry too much - different things 0 0 0 0  Trouble relaxing 1 0 0 0  Restless 0 0 0 0  Easily annoyed or irritable 0 0 0 0  Afraid - awful might happen 0 0 0 0  Total GAD 7 Score 2 0 0 0  Anxiety Difficulty Not difficult at all Not difficult at all  Not difficult at all Not difficult at all      No results found for any visits on 08/08/24.      Assessment & Plan:   Problem List Items Addressed This Visit     Stress at home - Primary   Effexor  XR 37.5 mg daily Has been on in the past with good results. Does not endorse depression or anxiety, feels like it will help her to cope with her stress better. PHQ-9 score: 0 GAD-7 score: 2 Refill sent. Follow-up in 6 months, sooner if anything changes.       Relevant Medications   venlafaxine  XR (EFFEXOR  XR) 37.5 MG 24 hr capsule    Meds ordered this encounter  Medications   venlafaxine  XR (EFFEXOR  XR) 37.5 MG 24 hr capsule    Sig: Take 1 capsule (37.5 mg total) by mouth daily with breakfast.    Dispense:  90 capsule    Refill:  1    Supervising Provider:   METHENEY, CATHERINE D [[id number]]  Agrees with plan of care discussed.  Questions answered.   Return if symptoms worsen or fail to improve.  Mariah JONELLE Brownie, FNP

## 2024-08-08 NOTE — Assessment & Plan Note (Addendum)
 Effexor  XR 37.5 mg daily Has been on in the past with good results. Does not endorse depression or anxiety, feels like it will help her to cope with her stress better. PHQ-9 score: 0 GAD-7 score: 2 Refill sent. Follow-up in 6 months, sooner if anything changes.

## 2024-09-25 ENCOUNTER — Encounter: Payer: Self-pay | Admitting: Family Medicine

## 2024-10-05 ENCOUNTER — Encounter: Payer: Self-pay | Admitting: Family Medicine

## 2024-10-05 ENCOUNTER — Ambulatory Visit: Admitting: Family Medicine

## 2024-10-05 VITALS — BP 122/75 | HR 71 | Temp 97.5°F | Ht 62.0 in | Wt 204.0 lb

## 2024-10-05 DIAGNOSIS — M5416 Radiculopathy, lumbar region: Secondary | ICD-10-CM | POA: Diagnosis not present

## 2024-10-05 DIAGNOSIS — M1712 Unilateral primary osteoarthritis, left knee: Secondary | ICD-10-CM

## 2024-10-05 MED ORDER — GABAPENTIN 100 MG PO CAPS: 100.0000 mg | ORAL_CAPSULE | Freq: Every day | ORAL | 0 refills | Status: DC

## 2024-10-05 NOTE — Assessment & Plan Note (Signed)
 Low back and left knee  osteoarthritis per sports medicine. This provider has left the practice.  Needs refill on gabapentin .  Taking gabapentin  100 mg nightly.  PDMP reviewed: no red flags  Interested in weaning off in the future. Weaning strategy discussed. Refill given to allow for weaning.

## 2024-10-05 NOTE — Progress Notes (Signed)
" ° °  Established Patient Office Visit  Subjective   Patient ID: Mariah [name], female    DOB: 1955-12-05  Age: 68 y.o. MRN: [id number]  Chief Complaint  Patient presents with   Medication follow up    Gabapentin     Low back and knee pain: treated for osteoarthritis per sports medicine. This provider has left the practice.  Needs refill on gabapentin .  Taking gabapentin  100 mg nightly.  PDMP reviewed: no red flags  Interested in weaning off in the future. Weaning strategy discussed. Refill given to allow for weaning.           ROS    Objective:     BP 122/75 (BP Location: Right Arm, Patient Position: Sitting, Cuff Size: Large)   Pulse 71   Temp (!) 97.5 F (36.4 C) (Oral)   Ht 5' 2 (1.575 m)   Wt 204 lb (92.5 kg)   LMP 07/11/2009   SpO2 96%   BMI 37.31 kg/m    Physical Exam Vitals and nursing note reviewed.  Constitutional:      General: She is not in acute distress.    Appearance: Normal appearance.  Pulmonary:     Effort: Pulmonary effort is normal.  Skin:    General: Skin is warm and dry.  Neurological:     General: No focal deficit present.     Mental Status: She is alert. Mental status is at baseline.  Psychiatric:        Mood and Affect: Mood normal.        Behavior: Behavior normal.        Thought Content: Thought content normal.        Judgment: Judgment normal.      No results found for any visits on 10/05/24.    The 10-year ASCVD risk score (Arnett DK, et al., 2019) is: 10.8%    Assessment & Plan:   Problem List Items Addressed This Visit     Primary osteoarthritis of left knee, status post right total knee arthroplasty - Primary   Low back and left knee  osteoarthritis per sports medicine. This provider has left the practice.  Needs refill on gabapentin .  Taking gabapentin  100 mg nightly.  PDMP reviewed: no red flags  Interested in weaning off in the future. Weaning strategy discussed. Refill given to allow for weaning.          Relevant Medications   gabapentin  (NEURONTIN ) 100 MG capsule   Right lumbar radiculopathy   Relevant Medications   gabapentin  (NEURONTIN ) 100 MG capsule  Agrees with plan of care discussed.  Questions answered.   Return if symptoms worsen or fail to improve.    Darice JONELLE Brownie, FNP  "

## 2024-10-09 ENCOUNTER — Encounter: Payer: Self-pay | Admitting: Family Medicine

## 2024-10-10 ENCOUNTER — Other Ambulatory Visit: Payer: Self-pay | Admitting: Family Medicine

## 2024-10-10 DIAGNOSIS — M7542 Impingement syndrome of left shoulder: Secondary | ICD-10-CM

## 2024-10-12 ENCOUNTER — Other Ambulatory Visit: Payer: Self-pay | Admitting: Sports Medicine

## 2024-10-12 DIAGNOSIS — M25512 Pain in left shoulder: Secondary | ICD-10-CM

## 2024-10-15 ENCOUNTER — Ambulatory Visit

## 2024-10-15 DIAGNOSIS — M25512 Pain in left shoulder: Secondary | ICD-10-CM

## 2024-10-26 ENCOUNTER — Telehealth: Payer: Self-pay

## 2024-10-26 NOTE — Telephone Encounter (Signed)
 Copied from CRM [id number]. Topic: Appointments - Transfer of Care >> Oct 26, 2024 10:49 AM Mariah [name] wrote: Pt is requesting to transfer FROM: Mariah [name] with [facility] as she is leaving the practice Pt is requesting to transfer TO: Mariah [name] with [facility] Reason for requested transfer: Her provider is leaving  It is the responsibility of the team the patient would like to transfer to [facility]) to reach out to the patient if for any reason this transfer is not acceptable.  Please assist patient further by contacting her to set up appointment as there are no new patient appointments available to schedule with [facility] available. The patient states she spoke with the provider on the phone yesterday and says she has agreed to take her on as a patient

## 2024-10-26 NOTE — Telephone Encounter (Signed)
 That is fine. She was previously my patient.

## 2024-10-29 NOTE — Telephone Encounter (Signed)
 Appointment made

## 2024-10-30 ENCOUNTER — Encounter: Payer: Self-pay | Admitting: Physician Assistant

## 2024-10-30 DIAGNOSIS — M7542 Impingement syndrome of left shoulder: Secondary | ICD-10-CM

## 2024-11-05 ENCOUNTER — Encounter: Admitting: Physician Assistant

## 2024-11-09 ENCOUNTER — Other Ambulatory Visit: Payer: Self-pay | Admitting: Orthopaedic Surgery

## 2024-11-09 DIAGNOSIS — M75122 Complete rotator cuff tear or rupture of left shoulder, not specified as traumatic: Secondary | ICD-10-CM

## 2024-11-14 ENCOUNTER — Encounter: Payer: Self-pay | Admitting: Physician Assistant

## 2024-11-14 ENCOUNTER — Ambulatory Visit: Admitting: Physician Assistant

## 2024-11-14 VITALS — BP 126/52 | HR 82 | Ht 61.5 in | Wt 203.5 lb

## 2024-11-14 DIAGNOSIS — Z23 Encounter for immunization: Secondary | ICD-10-CM | POA: Diagnosis not present

## 2024-11-14 DIAGNOSIS — M5416 Radiculopathy, lumbar region: Secondary | ICD-10-CM | POA: Diagnosis not present

## 2024-11-14 DIAGNOSIS — M1712 Unilateral primary osteoarthritis, left knee: Secondary | ICD-10-CM

## 2024-11-14 DIAGNOSIS — S46012D Strain of muscle(s) and tendon(s) of the rotator cuff of left shoulder, subsequent encounter: Secondary | ICD-10-CM

## 2024-11-14 DIAGNOSIS — Z940 Kidney transplant status: Secondary | ICD-10-CM

## 2024-11-14 DIAGNOSIS — M62838 Other muscle spasm: Secondary | ICD-10-CM

## 2024-11-14 DIAGNOSIS — F3342 Major depressive disorder, recurrent, in full remission: Secondary | ICD-10-CM

## 2024-11-14 DIAGNOSIS — Z7689 Persons encountering health services in other specified circumstances: Secondary | ICD-10-CM | POA: Diagnosis not present

## 2024-11-14 MED ORDER — VENLAFAXINE HCL ER 37.5 MG PO CP24: 37.5000 mg | ORAL_CAPSULE | Freq: Every day | ORAL | 1 refills | Status: AC

## 2024-11-14 MED ORDER — GABAPENTIN 100 MG PO CAPS: 100.0000 mg | ORAL_CAPSULE | Freq: Every day | ORAL | 1 refills | Status: AC

## 2024-11-14 MED ORDER — CYCLOBENZAPRINE HCL 5 MG PO TABS: ORAL_TABLET | ORAL | 1 refills | Status: AC

## 2024-11-14 NOTE — Progress Notes (Signed)
 "  Established Patient Office Visit  Subjective   Patient ID: Mariah [name], female    DOB: December 19, 1955  Age: 69 y.o. MRN: [id number]  Chief Complaint  Patient presents with   Transitions Of Care    HPI .Discussed the use of AI scribe software for clinical note transcription with the patient, who gave verbal consent to proceed.  History of Present Illness Mariah [name] is a 69 year old female who presents to re-establish care after PCP, Darice Brownie, left clinic.   Left Shoulder pain and dysfunction - Severe right shoulder symptoms with significant rotator cuff dysfunction, described as 'not even next door or in this state' but 'three or four states over'. - Recent worsening of symptoms following a period of stability, attributed to a shift in the shoulder. - Right-hand dominant, which has aided in managing daily activities. - Received an injection in 2024 with relief until December 2025, after which symptoms re-emerged. - Requires a refill for gabapentin  and is tolerating current dosages well. - Surgery planned for this month.  Physical activity and functional status - Maintains an active lifestyle, attending the gym two to three times per week. - Participates in [employer] classes on Mondays and Wednesdays. - Works in an office five days a week.  Mood - Doing well on effexor , no concerns  Renal transplant status - History of kidney transplant. - Follows with nephrology twice a year. - Scheduled to see transplant team on March 2nd. - Labs planned for February 23rd or 24th to ensure results are available for upcoming appointment.    ROS See HPI.    Objective:     BP (!) 126/52   Pulse 82   Ht 5' 1.5 (1.562 m)   Wt 203 lb 8 oz (92.3 kg)   LMP 07/11/2009   SpO2 100%   BMI 37.83 kg/m  BP Readings from Last 3 Encounters:  11/14/24 (!) 126/52  10/05/24 122/75  08/08/24 124/74   Wt Readings from Last 3 Encounters:  11/14/24 203 lb 8 oz (92.3 kg)   10/05/24 204 lb (92.5 kg)  07/24/24 201 lb 6.4 oz (91.4 kg)    ..    11/14/2024   11:00 AM 10/05/2024   10:18 AM 08/08/2024    3:18 PM 07/24/2024   10:39 AM 06/25/2024    1:39 PM  Depression screen PHQ 2/9  Decreased Interest 0 0 0 0 0  Down, Depressed, Hopeless 0 0 0 0 0  PHQ - 2 Score 0 0 0 0 0  Altered sleeping 0  0  0  Tired, decreased energy 0  0  0  Change in appetite 0  0  0  Feeling bad or failure about yourself  0  0  0  Trouble concentrating 0  0  0  Moving slowly or fidgety/restless 0  0  0  Suicidal thoughts 0  0  0  PHQ-9 Score 0  0   0   Difficult doing work/chores Not difficult at all Not difficult at all Not difficult at all  Not difficult at all     Data saved with a previous flowsheet row definition   ..    11/14/2024   11:00 AM 10/05/2024   10:18 AM 08/08/2024    3:19 PM 05/22/2024    1:25 PM  GAD 7 : Generalized Anxiety Score  Nervous, Anxious, on Edge 0 0  1  0   Control/stop worrying 0 0  0  0  Worry too much - different things 0 0  0  0   Trouble relaxing 0 0  1  0   Restless 0 0  0  0   Easily annoyed or irritable 0 0  0  0   Afraid - awful might happen 0 0  0  0   Total GAD 7 Score 0 0 2 0  Anxiety Difficulty Not difficult at all Not difficult at all Not difficult at all Not difficult at all     Data saved with a previous flowsheet row definition      Physical Exam  The 10-year ASCVD risk score (Arnett DK, et al., 2019) is: 11.5%    Assessment & Plan:  .Teneshia was seen today for transitions of care.  Diagnoses and all orders for this visit:  Transfer requested for continuity of care  Primary osteoarthritis of left knee -     gabapentin  (NEURONTIN ) 100 MG capsule; Take 1 capsule (100 mg total) by mouth at bedtime.  Muscle spasm -     cyclobenzaprine  (FLEXERIL ) 5 MG tablet; TAKE 1 TABLET BY MOUTH AT BEDTIME AS NEEDED FOR MUSCLE SPASM  Right lumbar radiculopathy -     gabapentin  (NEURONTIN ) 100 MG capsule; Take 1 capsule (100 mg  total) by mouth at bedtime.  Recurrent major depressive disorder, in full remission -     venlafaxine  XR (EFFEXOR  XR) 37.5 MG 24 hr capsule; Take 1 capsule (37.5 mg total) by mouth daily with breakfast.  Immunization due -     Pneumococcal conjugate vaccine 20-valent (Prevnar 20)  Impingement syndrome of shoulder, left -     cyclobenzaprine  (FLEXERIL ) 5 MG tablet; TAKE 1 TABLET BY MOUTH AT BEDTIME AS NEEDED FOR MUSCLE SPASM -     gabapentin  (NEURONTIN ) 100 MG capsule; Take 1 capsule (100 mg total) by mouth at bedtime.   Assessment & Plan Preoperative evaluation for right shoulder arthroplasty due to complete rotator cuff tear Scheduled for right shoulder arthroplasty due to a complete rotator cuff tear. Surgery necessary due to severity and location constraints. - Proceed with right shoulder arthroplasty as scheduled.  Muscle spasm Managed with cyclobenzaprine  as needed. - Continue cyclobenzaprine  5 MG oral at bedtime as needed.  MDD/GAD PHQ/GAD to goal Managed with venlafaxine . - Continue venlafaxine  XR 37.5 MG oral every day.  General health maintenance Up to date on health maintenance. Labs current. Nephrology transplant follow-up scheduled. - Need for pneumonia 20, given today.  - Continue current health maintenance schedule. Labs UTD in EMR.   Health care Team:   Dr. Cleotilde OBGYN Dr. Alyne and [facility] Dr. Curtis Sports Medicine Dr. Wilma Ophthalmology Dr. Venancio Dermatology Dr. Dasie Cardiology      Return in about 6 months (around 05/14/2025), or if symptoms worsen or fail to improve.    Laqueta Bonaventura, PA-C  "

## 2024-11-16 ENCOUNTER — Inpatient Hospital Stay: Admission: RE | Admit: 2024-11-16

## 2024-11-16 DIAGNOSIS — M75122 Complete rotator cuff tear or rupture of left shoulder, not specified as traumatic: Secondary | ICD-10-CM

## 2025-05-14 ENCOUNTER — Ambulatory Visit: Admitting: Physician Assistant
